# Patient Record
Sex: Male | Born: 1945 | Race: Black or African American | Hispanic: No | Marital: Married | Smoking: Former smoker
Health system: Southern US, Community
[De-identification: ages and names within clinical notes are randomized; demographics above are authoritative.]

## PROBLEM LIST (undated history)

## (undated) DIAGNOSIS — G709 Myoneural disorder, unspecified: Secondary | ICD-10-CM

## (undated) DIAGNOSIS — I219 Acute myocardial infarction, unspecified: Secondary | ICD-10-CM

## (undated) DIAGNOSIS — G4730 Sleep apnea, unspecified: Secondary | ICD-10-CM

## (undated) DIAGNOSIS — I10 Essential (primary) hypertension: Secondary | ICD-10-CM

## (undated) DIAGNOSIS — R0602 Shortness of breath: Secondary | ICD-10-CM

## (undated) DIAGNOSIS — E785 Hyperlipidemia, unspecified: Secondary | ICD-10-CM

## (undated) DIAGNOSIS — J449 Chronic obstructive pulmonary disease, unspecified: Secondary | ICD-10-CM

## (undated) DIAGNOSIS — I2510 Atherosclerotic heart disease of native coronary artery without angina pectoris: Secondary | ICD-10-CM

## (undated) HISTORY — PX: BACK SURGERY: SHX140

## (undated) HISTORY — PX: KNEE ARTHROPLASTY: SHX992

## (undated) HISTORY — DX: Acute myocardial infarction, unspecified: I21.9

## (undated) HISTORY — DX: Essential (primary) hypertension: I10

## (undated) HISTORY — DX: Myoneural disorder, unspecified: G70.9

## (undated) HISTORY — DX: Atherosclerotic heart disease of native coronary artery without angina pectoris: I25.10

## (undated) HISTORY — PX: CORONARY STENT PLACEMENT: SHX1402

## (undated) HISTORY — DX: Hyperlipidemia, unspecified: E78.5

## (undated) HISTORY — DX: Chronic obstructive pulmonary disease, unspecified: J44.9

## (undated) HISTORY — DX: Sleep apnea, unspecified: G47.30

## (undated) HISTORY — PX: CARDIAC CATHETERIZATION: SHX172

---

## 1998-08-11 ENCOUNTER — Ambulatory Visit (HOSPITAL_COMMUNITY): Admission: RE | Admit: 1998-08-11 | Discharge: 1998-08-11 | Payer: Self-pay | Admitting: Family Medicine

## 1999-03-07 ENCOUNTER — Encounter: Payer: Self-pay | Admitting: Family Medicine

## 1999-03-07 ENCOUNTER — Ambulatory Visit (HOSPITAL_COMMUNITY): Admission: RE | Admit: 1999-03-07 | Discharge: 1999-03-07 | Payer: Self-pay | Admitting: Family Medicine

## 2000-07-09 ENCOUNTER — Ambulatory Visit (HOSPITAL_COMMUNITY): Admission: RE | Admit: 2000-07-09 | Discharge: 2000-07-09 | Payer: Self-pay | Admitting: Neurosurgery

## 2000-07-09 ENCOUNTER — Encounter: Payer: Self-pay | Admitting: Neurosurgery

## 2000-07-23 ENCOUNTER — Encounter: Payer: Self-pay | Admitting: Neurosurgery

## 2000-07-23 ENCOUNTER — Ambulatory Visit (HOSPITAL_COMMUNITY): Admission: RE | Admit: 2000-07-23 | Discharge: 2000-07-23 | Payer: Self-pay | Admitting: Neurosurgery

## 2000-08-06 ENCOUNTER — Encounter: Payer: Self-pay | Admitting: Neurosurgery

## 2000-08-06 ENCOUNTER — Ambulatory Visit (HOSPITAL_COMMUNITY): Admission: RE | Admit: 2000-08-06 | Discharge: 2000-08-06 | Payer: Self-pay | Admitting: Neurosurgery

## 2001-02-03 ENCOUNTER — Encounter: Admission: RE | Admit: 2001-02-03 | Discharge: 2001-02-03 | Payer: Self-pay | Admitting: Family Medicine

## 2001-02-03 ENCOUNTER — Encounter: Payer: Self-pay | Admitting: Family Medicine

## 2002-12-29 ENCOUNTER — Encounter: Payer: Self-pay | Admitting: Orthopedic Surgery

## 2003-01-03 ENCOUNTER — Inpatient Hospital Stay (HOSPITAL_COMMUNITY): Admission: RE | Admit: 2003-01-03 | Discharge: 2003-01-06 | Payer: Self-pay | Admitting: Orthopedic Surgery

## 2004-04-11 ENCOUNTER — Ambulatory Visit (HOSPITAL_COMMUNITY): Admission: RE | Admit: 2004-04-11 | Discharge: 2004-04-11 | Payer: Self-pay | Admitting: *Deleted

## 2004-04-12 ENCOUNTER — Ambulatory Visit (HOSPITAL_COMMUNITY): Admission: RE | Admit: 2004-04-12 | Discharge: 2004-04-13 | Payer: Self-pay | Admitting: *Deleted

## 2004-04-26 ENCOUNTER — Ambulatory Visit: Payer: Self-pay | Admitting: Internal Medicine

## 2004-05-28 ENCOUNTER — Ambulatory Visit: Payer: Self-pay | Admitting: Cardiology

## 2004-06-05 ENCOUNTER — Ambulatory Visit: Payer: Self-pay | Admitting: Cardiology

## 2004-06-20 ENCOUNTER — Ambulatory Visit: Payer: Self-pay

## 2004-07-10 ENCOUNTER — Ambulatory Visit: Payer: Self-pay | Admitting: Internal Medicine

## 2004-07-25 ENCOUNTER — Ambulatory Visit: Payer: Self-pay | Admitting: Cardiology

## 2004-08-08 ENCOUNTER — Ambulatory Visit: Payer: Self-pay

## 2004-08-14 ENCOUNTER — Ambulatory Visit: Payer: Self-pay | Admitting: Cardiology

## 2004-09-11 ENCOUNTER — Ambulatory Visit: Payer: Self-pay | Admitting: Cardiology

## 2004-09-17 ENCOUNTER — Ambulatory Visit: Payer: Self-pay | Admitting: Cardiology

## 2004-10-08 ENCOUNTER — Ambulatory Visit: Payer: Self-pay | Admitting: Cardiology

## 2005-02-01 ENCOUNTER — Ambulatory Visit: Payer: Self-pay | Admitting: Cardiology

## 2005-02-04 ENCOUNTER — Ambulatory Visit: Payer: Self-pay | Admitting: Cardiology

## 2005-02-06 ENCOUNTER — Ambulatory Visit: Payer: Self-pay | Admitting: Internal Medicine

## 2005-02-06 ENCOUNTER — Inpatient Hospital Stay (HOSPITAL_BASED_OUTPATIENT_CLINIC_OR_DEPARTMENT_OTHER): Admission: RE | Admit: 2005-02-06 | Discharge: 2005-02-06 | Payer: Self-pay | Admitting: Cardiology

## 2005-02-28 ENCOUNTER — Ambulatory Visit: Payer: Self-pay | Admitting: Internal Medicine

## 2005-06-12 ENCOUNTER — Encounter: Admission: RE | Admit: 2005-06-12 | Discharge: 2005-06-12 | Payer: Self-pay | Admitting: Neurosurgery

## 2005-07-17 ENCOUNTER — Encounter: Admission: RE | Admit: 2005-07-17 | Discharge: 2005-07-17 | Payer: Self-pay | Admitting: Neurosurgery

## 2005-07-30 ENCOUNTER — Encounter: Admission: RE | Admit: 2005-07-30 | Discharge: 2005-07-30 | Payer: Self-pay | Admitting: Neurosurgery

## 2005-10-17 ENCOUNTER — Ambulatory Visit: Payer: Self-pay | Admitting: Cardiology

## 2006-08-14 ENCOUNTER — Ambulatory Visit: Payer: Self-pay | Admitting: Cardiology

## 2007-07-31 ENCOUNTER — Ambulatory Visit: Payer: Self-pay | Admitting: Cardiology

## 2008-08-09 ENCOUNTER — Ambulatory Visit: Payer: Self-pay | Admitting: Cardiology

## 2009-06-22 ENCOUNTER — Encounter (INDEPENDENT_AMBULATORY_CARE_PROVIDER_SITE_OTHER): Payer: Self-pay | Admitting: *Deleted

## 2009-08-07 DIAGNOSIS — I2510 Atherosclerotic heart disease of native coronary artery without angina pectoris: Secondary | ICD-10-CM | POA: Insufficient documentation

## 2009-08-07 DIAGNOSIS — I10 Essential (primary) hypertension: Secondary | ICD-10-CM | POA: Insufficient documentation

## 2009-08-07 DIAGNOSIS — E663 Overweight: Secondary | ICD-10-CM | POA: Insufficient documentation

## 2009-08-07 DIAGNOSIS — R002 Palpitations: Secondary | ICD-10-CM | POA: Insufficient documentation

## 2009-08-07 DIAGNOSIS — E7800 Pure hypercholesterolemia, unspecified: Secondary | ICD-10-CM | POA: Insufficient documentation

## 2009-08-08 ENCOUNTER — Encounter: Payer: Self-pay | Admitting: Cardiology

## 2009-08-08 DIAGNOSIS — F17200 Nicotine dependence, unspecified, uncomplicated: Secondary | ICD-10-CM | POA: Insufficient documentation

## 2009-08-09 ENCOUNTER — Ambulatory Visit: Payer: Self-pay | Admitting: Cardiology

## 2010-07-24 NOTE — Letter (Signed)
Summary: Appointment - Reminder [place], [place]  [address] [place]   [place], [place] [address]   Phone: [phone]  Fax: [phone]     June 22, 2009 MRN: [id number]   Gregory [name] [place], [place]  [id number]   Dear [name],  [place] records indicate that it is time to schedule a follow-up appointment with Dr. Ron Parker. It is very important that we reach you to schedule this appointment. We look forward to participating in your health care needs.   Please contact us at the number listed above at your earliest convenience to schedule your appointment.  If you are unable to make an appointment at this time, give [place] a call so we can update our records.  Sincerely,   Darnell Level [facility] Scheduling Team

## 2010-07-24 NOTE — Miscellaneous (Signed)
  Clinical Lists Changes  Problems: Added new problem of TOBACCO ABUSE (ICD-305.1) Observations: Added new observation of PAST MED HX: PALPITATIONS (ICD-785.1) CAD  DES...2005  /   relook cath 2006...no change...residual 70% Dx..old total RCA OVERWEIGHT/OBESITY (ICD-278.02) HYPERCHOLESTEROLEMIA  IIA (ICD-272.0) HYPERTENSION, UNSPECIFIED (ICD-401.9) erectile dysfunction.  Degenerative joint disease.  Gout.  Benign prostatic hypertrophy.  Knee arthritis. Tobacco abuse  (08/08/2009 17:49)       Past History:  Past Medical History: PALPITATIONS (ICD-785.1) CAD  DES...2005  /   relook cath 2006...no change...residual 70% Dx..old total RCA OVERWEIGHT/OBESITY (ICD-278.02) HYPERCHOLESTEROLEMIA  IIA (ICD-272.0) HYPERTENSION, UNSPECIFIED (ICD-401.9) erectile dysfunction.  Degenerative joint disease.  Gout.  Benign prostatic hypertrophy.  Knee arthritis. Tobacco abuse

## 2010-07-24 NOTE — Assessment & Plan Note (Signed)
Summary: f1y  Medications Added CARVEDILOL 12.5 MG TABS (CARVEDILOL) Take one tablet by mouth once daily AVODART 0.5 MG CAPS (DUTASTERIDE) once daily ENALAPRIL MALEATE 5 MG TABS (ENALAPRIL MALEATE) Take one tablet by mouth twice a day CADUET 10-10 MG TABS (AMLODIPINE-ATORVASTATIN) once daily      Allergies Added: NKDA  Visit Type:  Follow-up  CC:  CAD.  History of Present Illness: The patient is seen for followup of coronary artery disease.  I saw him last exactly one year ago.  He is doing well.  He is not having any chest pain.  Unfortunately he continues to smoke.  He and I had a careful discussion about this and I urged him to begin to quit.  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  He received circumflex stents in 2005.  He had a relook catheter in 2006 and the stents were open.  There was a residual 70% diagonal lesion.  He needs ongoing aggressive secondary prevention.  He is overweight.  Current Medications (verified): 1)  Carvedilol 12.5 Mg Tabs (Carvedilol) .... Take One Tablet By Mouth Once Daily 2)  Avodart 0.5 Mg Caps (Dutasteride) .... Once Daily 3)  Enalapril Maleate 5 Mg Tabs (Enalapril Maleate) .... Take One Tablet By Mouth Twice A Day 4)  Caduet 10-10 Mg Tabs (Amlodipine-Atorvastatin) .... Once Daily  Allergies (verified): No Known Drug Allergies  Past History:  Past Medical History: Last updated: 08/08/2009 PALPITATIONS (ICD-785.1) CAD  DES...2005  /   relook cath 2006...no change...residual 70% Dx..old total RCA OVERWEIGHT/OBESITY (ICD-278.02) HYPERCHOLESTEROLEMIA  IIA (ICD-272.0) HYPERTENSION, UNSPECIFIED (ICD-401.9) erectile dysfunction.  Degenerative joint disease.  Gout.  Benign prostatic hypertrophy.  Knee arthritis. Tobacco abuse  Review of Systems       Patient denies fever, chills, headache, sweats, rash, change in vision, change in hearing, chest pain, cough, shortness of breath, nausea or vomiting, urinary symptoms.  All  of the systems are reviewed and are negative.  Vital Signs:  Patient profile:   65 year old male Height:      75 inches Weight:      277 pounds BMI:     34.75 Pulse rate:   78 / minute BP sitting:   164 / 86  (left arm) Cuff size:   large  Vitals Entered By: Mignon Pine, RMA (August 09, 2009 3:26 PM)  Physical Exam  General:  patient is definitely overweight.  He is stable today. Head:  head is atraumatic. Eyes:  eyes reveal no xanthelasma. Neck:  no jugular venous distention.  No carotid bruits. Chest Wall:  no chest wall tenderness. Lungs:  lungs are clear.  Respiratory effort is nonlabored. Heart:  cardiac exam reveals S1-S2.  No clicks or significant murmurs. Abdomen:  abdomen is obese. Msk:  no musculoskeletal deformities. Extremities:  no peripheral edema. Skin:  no skin rashes. Psych:  patient is oriented to person time and place.  Affect is normal.   Impression & Recommendations:  Problem # 1:  TOBACCO ABUSE (ICD-305.1) I've had a careful discussion with the patient about his smoking.  I have strongly urged him to begin to cut back again and try to stop.  Problem # 2:  PALPITATIONS (ICD-785.1) The patient has not been having any palpitations.  No further workup is needed.  Problem # 3:  CAD, NATIVE VESSEL (ICD-414.01)  His updated medication list for this problem includes:    Carvedilol 12.5 Mg Tabs (Carvedilol) .Marland Kitchen... Take one tablet by mouth once daily  Enalapril Maleate 5 Mg Tabs (Enalapril maleate) .Marland Kitchen... Take one tablet by mouth twice a day  Orders: EKG w/ Interpretation (93000) The patient has not been having chest pain.  EKG is done and reviewed by me.  There no significant changes.  The patient needs aggressive secondary prevention.  Problem # 4:  HYPERCHOLESTEROLEMIA  IIA (ICD-272.0)  His updated medication list for this problem includes:    Caduet 10-10 Mg Tabs (Amlodipine-atorvastatin) ..... Once daily The patient is on medication for his  cholesterol.  He will be following up with his primary physician soon for his annual physical.  It would be optimal to have his LDL at 70.  Problem # 5:  HYPERTENSION, UNSPECIFIED (ICD-401.9)  His updated medication list for this problem includes:    Carvedilol 12.5 Mg Tabs (Carvedilol) .Marland Kitchen... Take one tablet by mouth once daily    Enalapril Maleate 5 Mg Tabs (Enalapril maleate) .Marland Kitchen... Take one tablet by mouth twice a day    Caduet 10-10 Mg Tabs (Amlodipine-atorvastatin) ..... Once daily Blood pressure is elevated today.  He will have followup blood pressure with his primary physician to decide if further medicine changes are needed.  Weight loss would certainly help.  Patient Instructions: 1)  Follow up in 1 year

## 2010-11-06 NOTE — Assessment & Plan Note (Signed)
[facility] OFFICE NOTE   ORVELL, DERENZO                        MRN:          [id number]  DATE:07/31/2007                            DOB:          [birth year]    HISTORY:  Mr. Gregory [name] is doing well.  He has gained weight and we spoke  about this.  Otherwise, he is doing well.  He remains active.  He is not  having any significant chest pain.  He is retired.  He had been  considering knee surgery, but decided to hold off and he is managing  without it at this point.   ALLERGIES:  NO KNOWN DRUG ALLERGIES.   MEDICATIONS:  1. Caduet.  2. Avodart.  3. Multivitamin.  4. Aspirin.  5. Coreg.  6. Sertraline.  7. Enalapril.   OTHER MEDICAL PROBLEMS:  See the list below.   REVIEW OF SYSTEMS:  Other than the HPI, his review of systems is  negative.   PHYSICAL EXAMINATION:  VITAL SIGNS:  His weight is up to 258 pounds.  We  talked a bit about this and I made it clear that he needs to lose  weight, start an exercise program and cut his calories.  Blood pressure  is 140/87, pulse 69.  GENERAL:  The patient is oriented to person, time and place.  Affect is  normal.  HEENT:  Reveals no xanthelasma.  He has normal extraocular  motion.  NECK:  There are no carotid bruits.  There is no jugular venous  distention.  LUNGS:  Clear.  Respiratory effort is not labored.  CARDIAC:  Reveals S1-S2.  There are no clicks or significant murmurs.  ABDOMEN:  Obese.  EXTREMITIES:  He has no peripheral edema.   DIAGNOSTICS:  EKG reveals sinus rhythm.   PROBLEMS:  1. History of coronary disease with a drug-eluting stent in 2005 with      a ZoMaxx trial.  Re-look cath in August 2006 looked good.  Diagonal      was 70% stenosed.  The circumflex stents were patent.  Right      coronary artery historically was occluded in the past.  Situation      was stable.  There is no reason for further testing at this time.  2. Hypertension.  He  is borderline adequately treated and he is      following with Dr. Juventino Slovak.  3.  Hyperlipidemia, on medication.  3. Obesity.  He clearly needs to lose weight.  4. History of palpitations.  5. History of tobacco use in the past.  6. History of erectile dysfunction.  7. Degenerative joint disease.  8. Gout.  9. Benign prostatic hypertrophy.  10.Knee arthritis, but this is stable.   PLAN:  He is stable.  I am not changing his meds.  Weight loss will be  very important.     Carlena Bjornstad, MD, [facility]  Electronically Signed    JDK/MedQ  DD: 07/31/2007  DT: 08/01/2007  Job #: [id number]   cc:   Nelda Severe. Juventino Slovak, M.D.

## 2010-11-06 NOTE — Assessment & Plan Note (Signed)
[facility] HEALTHCARE                            CARDIOLOGY OFFICE NOTE   [name], [name]                        MRN:          [id number]  DATE:08/09/2008                            DOB:          05-01-1946    Gregory [name] is doing well.  He has coronary disease.  He has not had any  chest discomfort.  Unfortunately, he has gained weight.  He is doing  what he can to lose weight.  He does continue to smoke.  He and I had an  extensive discussion about this.  He described to me how he has cut out  his cigarettes at nighttime, but he still needs to work on cutting out  more during the day.  He knows that fewer is better and no cigarettes is  best.  Also, I asked him to encourage his wife to stop also.  He has  been doing well.  He has not had any chest pain or shortness of breath.  There has been no syncope or presyncope.   ALLERGIES:  No known drug allergies.   MEDICATIONS:  Caduet, Avodart, multivitamin, aspirin, Coreg, sertraline,  and enalapril.   OTHER MEDICAL PROBLEMS:  See the list below.   REVIEW OF SYSTEMS:  He has no GI or GU symptoms.  He has no fevers or  chills.  There are no headaches.  He has no eye problems or dental  problems.  Otherwise, his review of systems is negative.   PHYSICAL EXAMINATION:  Blood pressure is 146/88 with a pulse of 64.  Weight is 275 pounds which is definitely up from last year.  HEENT  reveals no xanthelasma.  He has normal extraocular motion.  There are no  carotid bruits.  There is no jugular venous distention.  Lungs are  clear.  Respiratory effort is not labored.  Cardiac exam reveals an S1  with an S2.  There are no clicks or significant murmurs.  The abdomen is  obese.  There is no peripheral edema.   EKG shows no significant change.   Problems include:  1. History of coronary disease with drug-eluting stent in 2005.      Relook catheterization in 2006 looked good.  There is a diagonal      with a 70%  stenosis.  Circumflex stents were patent.  Right      coronary artery was historically occluded.  He does not need any      testing.  He needs aggressive secondary prevention.  He needs to      stop smoking and to have his LDL as low as 7.  2. Hypertension, treated.  3. Obesity.  He clearly needs to lose weight.  4. History of palpitations, stable.  5. Tobacco use.  See my full discussion above.  6. History of erectile dysfunction.  7. Degenerative joint disease.  8. Gout.  9. Benign prostatic hypertrophy.  10.Knee arthritis.   He is stable.  He does not need any further studies.     Gregory Bjornstad, MD, [facility]  Electronically Signed    JDK/MedQ  DD: 08/09/2008  DT: 08/09/2008  Job #: [id number]   cc:   Nelda Severe. Juventino Slovak, M.D.

## 2010-11-09 NOTE — Assessment & Plan Note (Signed)
[facility] HEALTHC[facility]                            CARDIOLOGY OFFICE NOTE   [name], Gregory                        MRN:          [id number]  DATE:08/14/2006                            DOB:          August 03, 1945    Gregory [name] is doing well.  He has known coronary disease, unfortunately,  he continues to smoke.  He said that it is in the range of 10-11  cigarettes a day.  He and I once again discussed this and urged him to  stop.  Patient received drug-eluting stents in 2005.  He did not have  chest pain at that time.  He had had some palpitations and shortness of  breath on exertion.  The Cardiolite raised the question of inferior  ischemia, and we did proceed with a catheterization.  It is of note that  an echo done similar to that time in September of 2005 revealed  basically normal LV function.  Patient then underwent a cardiac  catheterization.  Study was done on April 12, 2004.  At that time, it  appeared that he had LV dysfunction.  There was decreased motion of the  inferior wall.  He had 3-vessel coronary disease with chronic total  occlusion of his right coronary artery with left-to-right collaterals.  There was 60% second diagonal stenosis, there was also discrete 80% mid  circumflex lesion.  The circumflex was treated with 2 drug-eluting  stents.  The procedure was somewhat complicated by a linear dissection  that was treated with the stents.  He stabilized, and was ultimately  discharged.  He was part of the Zommax trial, and he did well.  I have  followed him over time.  He is doing well and not having any chest pain.  There have been difficulties with blood pressure control.  Ultimately,  this stabilized with the patient on the appropriate medications.  Abdominal ultrasound and renal ultrasounds have been technically  difficult.  He has no evidence of renal artery stenosis.  However, with  the study technically difficult, CT or MR could be  considered, although  this is a problem that is under better control at this point.  Patient  was seen last in April of 2007, appeared to be stable.   Patient now needs a total knee replacement on the left knee.  He is here  for evaluation to see if he can be cleared surgically.   PAST MEDICAL HISTORY:   ALLERGIES:  NO KNOWN DRUG ALLERGIES.   MEDICATIONS:  1. Caduet 10/10.  2. Avodart 0.5.  3. Multivitamin.  4. Aspirin 81.  5. Coreg 12.5 b.i.d.  6. Enalapril 5 b.i.d.  7. Plavix 75.   OTHER MEDICAL PROBLEMS.:  See the list below.   REVIEW OF SYSTEMS:  He has no complaints and feels well.  His review of  systems is negative.   PHYSICAL EXAMINATION:  Weight is 244 pounds.  Blood pressure today is  176/95, and once again, it is higher than we would like, and his  enalapril dose will be increased.  Pulse is 66.  Patient is oriented  to person, time, and place.  Affect is normal.  He  is overweight.  There is no xanthelasma.  He has normal extraocular motion.  He has no  carotid bruits.  There is no jugular venous distension.  CARDIAC EXAM:  Reveals an S1 with an S2.  There are no clicks or  significant murmurs.  His abdomen is soft.  He has no masses or bruits.  There is no peripheral edema.  There are no major musculoskeletal deformities.   EKG reveals normal sinus rhythm.  There are no diagnostic abnormalities.   PROBLEM LIST:  1. History of coronary artery disease post stenting with drug-eluting      stents in 2005, and the Zommax trial.  He is doing well.  He      underwent relook catheterization in August of 2006.  His left      anterior descending showed only minor disease.  Diagonal was 70%      stenosed.  The circumflex stents were patent.  Right coronary      artery, historically, was occluded in the past.  This revealed that      his situation was stable at that time.  2. Hypertension.  Will adjust his meds further.  3. Hyperlipidemia.  This needs to be followed  up.  4. Obesity.  He needs to lose weight.  5. History of palpitations, stable.  6. History of tobacco use, once again, we discussed this fully.  7. History of erectile dysfunction.  8. History of DJD.  9. History of gout.  10.BPH.  11.Need for a left knee replacement.   Because the patient did have a relook catheterization, and we know that  his stents were stable, I feel that we can proceed with his surgery  without further testing.  I have thought very carefully about his  Plavix.  He is now over 2 years out from his stent.  I have decided to  stop his Plavix, but stop it now to be sure that he remains stable prior  to undergoing his knee replacement.  Plavix will be stopped today.  He  will be given permission to proceed with his knee surgery.  The surgery  needs to be at least 2 weeks from now to be sure that the patient has  had no symptoms during that period from withdrawal from his Plavix.     Carlena Bjornstad, MD, [facility]  Electronically Signed    JDK/MedQ  DD: 08/14/2006  DT: 08/14/2006  Job #: [id number]   cc:   Gaynelle Arabian, M.D.  Nelda Severe Juventino Slovak, M.D.

## 2010-11-09 NOTE — Cardiovascular Report (Signed)
NAMEJHOEL, Gregory               ACCOUNT NO.:  [id number]   MEDICAL RECORD NO.:  [id number]          PATIENT TYPE:  OIB   LOCATION:  6501                         FACILITY:  [facility]   PHYSICIAN:  Loretha Brasil. Lia Foyer, M.D. [facility] OF BIRTH:  12/30/1945   DATE OF PROCEDURE:  02/06/2005  DATE OF DISCHARGE:                              CARDIAC CATHETERIZATION   INDICATIONS:  [name] is a delightful 65 year old gentleman who previously  was enrolled in the ZOMAXX trial. This study was done as a research follow-  up to assess vessel patency. Intravascular ultrasound was done after the  initial stent deployment, but they were unable to get the ultrasound  catheter down after the dilatation due to the anatomic considerations in the  circumflex coronary artery. Therefore, repeat ultrasound was not attempted.   PROCEDURE:  Left coronary artery angiography.   DESCRIPTION OF PROCEDURE:  Following informed consent, the patient was  brought to the catheterization laboratory, prepped and draped in usual  fashion.  Through an anterior puncture, a 6-French sheath was placed.  A  left 4 diagnostic 6-French catheter was then utilized to take pictures in  the preprescribed views utilized during the interventional procedure. The  procedure demonstrated continued patency of the circumflex stent. Multiple  views of left coronary were obtained. The right coronary artery is known to  be occluded with extensive left-to-right collaterals and the collaterals  were visualized on this study so therefore the right coronary was not  reinjected.  Ventriculography was not performed as previous evaluation of  left ventricular function has been obtained.  The procedure was performed  without complication. He was taken to the holding area in satisfactory  clinical condition for direct manual hemostasis.   HEMODYNAMIC DATA:  Central aortic pressure 139/71, mean 98.   ANGIOGRAPHIC DATA:  1.  The left main coronary  artery is free of critical disease.  2.  The target artery is the circumflex coronary artery. There are      overlapping stents in the circumflex coronary artery.  The stents appear      widely patent without significant focal narrowing. There is minimal      luminal irregularity of about 30% beyond the stent location, but it does      not appear to be high-grade in the multiple views obtained. This vessel      terminates as a large bifurcating marginal in this vessel.  3.  The left anterior descending artery courses to the apex where it      provides extensive collateralization of the PDA all the way back to the      mid vessel.  There are also collaterals that are provided to the      posterolateral system.  The first diagonal is without critical disease.      The second diagonal has what appears to be about 70% ostial stenosis      followed by a 70% segmental mid stenosis. It is a small to moderate size      vessel.  This vessel does provide collaterals to the posterolateral  system.   CONCLUSION:  Continued patency of the previously placed overlapping stents  in the circumflex coronary artery.   DISPOSITION:  The patient will have follow up in the clinic.      Loretha Brasil. Lia Foyer, M.D. [facility]  Electronically Signed     TDS/MEDQ  D:  02/06/2005  T:  02/06/2005  Job:  [id number]   cc:   CV Lab

## 2010-11-09 NOTE — Discharge Summary (Signed)
NAME:  Gregory [name], Gregory [name]                         ACCOUNT NO.:  [id number]   MEDICAL RECORD NO.:  [id number]                   PATIENT TYPE:  INP   LOCATION:  [id number]                                 FACILITY:  [facility]   PHYSICIAN:  Gaynelle Arabian, M.D.                 DATE OF BIRTH:  May 29, 1946   DATE OF ADMISSION:  01/03/2003  DATE OF DISCHARGE:  01/06/2003                                 DISCHARGE SUMMARY   ADMITTING DIAGNOSES:  1. Osteoarthritis, right knee.  2. Hypertension.  3. Hypercholesterolemia.  4. Benign prostatic hypertrophy.  5. Lumbar degenerative disk disease.   DISCHARGE DIAGNOSES:  1. Osteoarthritis right knee, status post right total knee arthroplasty.  2. Mild postoperative blood loss anemia, did not require transfusion.  3. Postoperative hypokalemia.  4. Hypertension.  5. Hypercholesterolemia.  6. Benign prostatic hypertrophy.  7. Lumbar degenerative disk disease.   PROCEDURE:  The patient was taken to the operating room on January 03, 2003,  underwent a right total knee arthroplasty.  Surgeon:  Dr. Pilar Plate Aluisio.  Assistant:  Arlee Muslim, P.A.C.  Spinal anesthesia.  Estimated blood loss  minimal.  Hemovac drain x1.  Tourniquet time:  50 minutes at 300 mmHg.   CONSULTS:  None.   BRIEF HISTORY:  The patient is a 65 year old male seen by Dr. Wynelle Link for  right knee pain.  Has known bilateral knee arthritis.  Has undergone Synvisc  injections with only minimal relief.  The pain has been increasing to the  point where he is having pain throughout the day.  He was seen in the office  where x-rays showed severe end-stage arthritis, especially in the right  knee.  It was felt he would benefit from undergoing surgery.  The risks and  benefits were discussed.  The patient elected to proceed with surgery.   LABORATORY DATA:  CBC on admission revealed a hemoglobin of 14.4, hematocrit  of 42.4, white cell count 7.4, red cell count 4.79, differential all within  normal  limits.  Postoperative H&H 11.5 and 33.7.  Last noted H&H 10.1 and  29.4.  PT and PTT on admission 13.3 and 29 respectively, INR of 1.  Last  noted PT/INR 15.9 and 1.4.  Chemistry panel on admission:  Low potassium of  3.3, remaining chemistry panel all within normal limits.  Serial BMETs were  followed.  Potassium remained low at 3.3, down to 3.1.  Glucose went up from  94 to 124, back down to 119.  Calcium dropped from 8.6 down to 8.2.  Urinalysis did show trace ketones, small leukocyte esterase, only a few  epithelial cells, 3-6 white cells, and hyaline casts.  Blood group type A  positive.   EKG dated December 29, 2002:  Normal sinus rhythm with sinus arrhythmia.  Normal  EKG.  No old tracing to compare, per Dr. Dorris Carnes.   Chest x-ray dated December 29, 2002:  No  active lung findings.  There is  scoliosis of the thoracic spine with mild degenerative changes.  There was  thought to be a small granuloma in the right mid lung field.  Follow-up  chest x-ray suggested in two to three months.   HOSPITAL COURSE:  The patient was admitted to [facility], taken to  the OR, underwent the above-stated procedure without complication.  The  patient tolerated the procedure well and was later transferred to the  recovery room and then to the orthopedic floor for continued postoperative  care.  Vital signs were followed.  The patient received 24 hours of  postoperative antibiotics in the form of Ancef, given Coumadin for DVT  prophylaxis, started on PCA and p.o. analgesia for pain control following  surgery, started weightbearing as tolerated.  PT and OT were consulted to  assist with gait-training ambulation and ADLs following surgery.  Hemovac  drain placed at the time of surgery was pulled on postoperative day #1  without difficulty.  The patient was given potassium supplements due to the  low potassium.  The patient had a fairly decent ______.  On postoperative  day #1 fluids were reduced to  a KVO rate.  The patient started out with  physical therapy and actually progressed very well with PT.  Already up  ambulating approximately 130 feet by postoperative day #2 and then 200 feet  later that evening.  On day #2 the dressing was changed.  The incision was  healing well, with no signs of infection.  Also, the PC analgesia was  discontinued.  Discharge planning consulted to assist with post hospital  care.  The patient was doing quite well.  Had already been up ambulating 200  feet.  Felt to be ready for home the following day.  The following day, on  January 06, 2003, doing quite well.  Seen by head nurse for Dr. Wynelle Link and  discharged home.   DISCHARGE PLAN:  1. Discharged home on January 06, 2003.  2. Discharge diagnoses:  Please see above.  3. Discharge medications:  Coumadin for DVT prophylaxis.  Percocet for pain     or spasm.  4. Diet:  As tolerated.  5. Follow-up:  Two weeks from surgery.  6. Activity:  Full weightbearing, total-knee protocol.  Home health PT, home     health nursing through [facility].   CONDITION UPON DISCHARGE:  Improved.     Alexzandrew L. Dara Lords, P.A.              Gaynelle Arabian, M.D.    ALP/MEDQ  D:  02/02/2003  T:  02/03/2003  Job:  [id number]   cc:   Nelda Severe. Juventino Slovak, M.D.  [address] [place]  [place] [address]  Fax: [phone]   Hanley Ben, M.D.  [place]. [address] [place]  [place] [address]  Fax: [phone]

## 2010-11-09 NOTE — Cardiovascular Report (Signed)
NAMESMAUEL, HORACEK               ACCOUNT NO.:  [id number]   MEDICAL RECORD NO.:  [id number]          PATIENT TYPE:  OIB   LOCATION:  2899                         FACILITY:  [facility]   PHYSICIAN:  Junious Silk, M.D. LHCDATE OF BIRTH:  11-Dec-1945   DATE OF PROCEDURE:  04/12/2004  DATE OF DISCHARGE:                              CARDIAC CATHETERIZATION   PROCEDURE:  1.  Left heart catheterization with coronary angiography and left      ventriculography.  2.  Percutaneous transluminal coronary angioplasty placement with drug      eluding stent x2 in the mid left circumflex coronary artery.  3.  Intravascular ultrasound was utilized.   INDICATIONS FOR PROCEDURE:  Mr. Kirshenbaum is a 65 year old male who presented  with palpitations and was found to have premature ventricular contractions  and premature atrial contractions. He had evaluation including a stress  nuclear scan, which showed an ejection fraction of approximately 45% with  inferior infarct and inferolateral ischemia. He was thus referred for  cardiac catheterization.   CATHETERIZATION PROCEDURE:  A #6 French sheath was placed in the right  femoral artery. Coronary angiography was performed with a standard Judkins  #6 French catheters. Left ventriculography was performed with an angled  pigtail catheter. Contrast was Omnipaque. There were no complications.   HEMODYNAMICS:  Left ventricular pressure 164/26.  Aortic pressure 172/100.  There is no aortic valve gradient.   LEFT VENTRICULOGRAM:  There is moderate akinesis of the inferior wall.  Ejection fraction estimated at 40%. There is no mitral regurgitation.   CORONARY ARTERIOGRAPHY(RIGHT DOMINANT):  Left main is normal.  Left anterior descending artery has a 20% stenosis in the mid vessel. The  left anterior descending gives rise to 2 normal size diagonal branches. The  second diagonal branch has a 60% stenosis at its ostium and a tubular 75%  stenosis in the mid  segment.  Left circumflex is a large vessel, giving rise to small first and second  marginal branches and a large bifurcating third obtuse marginal branch. This  was a very tortuous vessel. In the mid circumflex, there is a discrete 80%  stenosis.  Right coronary artery has a long 90% stenosis in the proximal vessel,  followed by 100% occlusion at the proximal portion of the mid right coronary  artery. The distal right coronary artery including a posterior descending  and posterolateral branch filled the left to right collaterals.   IMPRESSION:  1.  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  2.  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      There is a significant disease in the mid left circumflex which does      correspond with the area of ischemia, demonstrated on Cardiolite. There      is also disease in the second diagonal branch, which is approximately a      2 mm diameter vessel.   PLAN:  After reviewing the images and discussion  with the patient, we have  opted to proceed with percutaneous coronary intervention of the left  circumflex. percutaneous transluminal coronary angioplasty   PERCUTANEOUS TRANSLUMINAL CORONARY ANGIOPLASTY PROCEDURE NOTE:  Following  completion of the diagnostic catheterization, we proceeded with percutaneous  coronary intervention. The patient was enrolled in the Zomax 2 study and  treated per randomization. Angiomax was administered per protocol. We used a  #6 [place] CLS 3.5 guiding catheter. An Asahi guidewire was advanced under  fluoroscopic guidance into the distal circumflex. We then performed  percutaneous transluminal coronary angioplasty with a 3.5 x 12 mm Quantum  balloon inflated to 12 atmospheres. This did result in improvement in the  vessel lumen. We then advanced a 3.5 x 13 mm Zomax  study stent into position  across the lesion. As we were positioning the stent across the lesion, a  significant dissection developed within the vessel to the tortuous segment  in which we were placing the stent. We therefore, advanced the stent to  cover the distal edge of this dissection and applied the stent at 8  atmospheres. We then advanced a second Zomax study stent, which was 3.5 x 18  mm and positioned this in the proximal area of disease with minimal overlap  of the first placed stent. This stent was deployed at 9 atmospheres. We then  went in with a 3.5 x 12 mm Quantum balloon and inflated this to 12  atmospheres in the distal aspect of the stent, 14 atmospheres x2 in the mid  portion of the stents and 16 atmospheres in the proximal angio stent.  Following this, we performed intravascular ultrasound of the circumflex.  This revealed that the stent was under-deployed in both the distal and most  proximal segments of the stented vessel. We therefore went in with a 3.75 x  15 mm Quantum balloon and inflated this to 14 atmospheres in the distal edge  of the stent, 18 atmospheres in the mid portion of the stents and 18  atmospheres in the proximal portion of the stent. We then reattempted to  advance her IVIS catheter, however, due to apparent change in the  architecture of the vessel with our last post-dilatation, we were unable to  advance the IVIS catheter around the bends and into the proximal portion of  the stent. We therefore, went back with our 3.75 x 15 mm Quantum balloon and  inflated this to 20 atmospheres in the distal mid and proximal portions of  the stents. Intermittent doses of nitroglycerin were administered. Final  angiographic images were obtained revealing patency of the left circumflex  with 0% residual stenosis at the stent site and TIMI-3 flow into the distal vessel. At the conclusion of the procedure, an Angio-Seal vascular closure  device was placed in the  right femoral artery with good hemostasis.   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 This was successfully  treated with 2 stents.   RESULTS:  Successful percutaneous transluminal coronary angioplasty with  drug eluding stent x2 in the mid left circumflex. An 80% stenosis was  reduced to 0% residual TIMI-3 flow.   PLAN:  Integrilin will be discontinued at this point. The patient will be  treated with Plavix and aspirin for recommended 1 year. He also needs  aggressive risk factor modification. Of note, he should stay on full  strength 325 mg of aspirin and Plavix 75 mg daily x1 year.  MWP/MEDQ  D:  04/12/2004  T:  04/12/2004  Job:  [id number]

## 2010-11-09 NOTE — Op Note (Signed)
NAME:  Gregory [name], Gregory [name]                         ACCOUNT NO.:  [id number]   MEDICAL RECORD NO.:  [id number]                   PATIENT TYPE:  INP   LOCATION:  X007                                 FACILITY:  [facility]   PHYSICIAN:  Gaynelle Arabian, M.D.                 DATE OF BIRTH:  1946/02/14   DATE OF PROCEDURE:  01/03/2003  DATE OF DISCHARGE:                                 OPERATIVE REPORT   PREOPERATIVE DIAGNOSIS:  Osteoarthritis, right knee.   POSTOPERATIVE DIAGNOSIS:  Osteoarthritis, right knee.   PROCEDURE:  Right total knee arthroplasty.   SURGEON:  Dione Plover. Aluisio, M.D.   ASSISTANT:  Alexzandrew L. Dara Lords, P.A.   ANESTHESIA:  Spinal.   ESTIMATED BLOOD LOSS:  Minimal.   DRAIN:  Hemovac x 1.   TOURNIQUET TIME:  50 minutes at 300 mmHg.   COMPLICATIONS:  None.   CONDITION:  Stable to recovery.   BRIEF CLINICAL NOTE:  Mr. Mackillop is a 65 year old male with severe end-stage  osteoarthritis of the right knee with pain refractory to nonoperative  management.  He presents now for right total knee arthroplasty.   PROCEDURE IN DETAIL:  After the successful administration of spinal  anesthetic, a tourniquet is placed high on the right thigh and the right  lower extremity prepped and draped in the usual sterile fashion.  Extremity  is wrapped in Esmarch, knee flexed, and tourniquet inflated to 300 mmHg.  Midline incision is made with a 10 blade through the subcutaneous tissue to  the level of the extensor mechanism.  A fresh blade is used to make a medial  parapatellar arthrotomy.  Then the soft tissue over the proximal and medial  tibia is subperiosteally elevated to joint line with a knife and at the  semimembranosus bursa with a Cobb elevator.  The soft tissue over the  proximal and lateral tibia is also elevated with attention being paid to  avoiding the patella tendon on tibial tubercle.  The patella is then  everted, knee flexed 90 degrees, ACL and PCL removed.  Drill is  used to  create a starting hole in the distal femur, and canal is irrigated.  Five-  degree right valgus alignment guide is placed and referencing off the  posterior condyles, rotation is marked and a block pinned to remove 10 mm  off the distal femur.  Distal femoral resection is made with an oscillating  saw.  Sizing block is placed; and a size 5 is the most appropriate.  Rotation corresponds with the epicondylar axis.  A size 5 cutting block is  placed and the anterior and posterior cuts made.   Tibia is subluxed forward, and the menisci are removed.  Extramedullary  tibial alignment guide is placed, referencing proximally at the medial  aspect of the tibial tubercle and distally along the second metatarsal axis  and tibial crest.  The block is pinned to remove  10 mm off the nondeficient  lateral side, and tibia resection is made with an oscillating saw.  Size 5  tibia is most appropriate, and then the proximal tibia is prepared with the  modular drill and keel punch.  The femur is then completed with the  intercondylar and chamfer cuts.  Size 5 posterior stabilized femoral trial  is placed with a size 5 mobile bearing tibial tray trial and a 10 mm  posterior stabilized rotating platform insert trial.  Full extension is  achieved with excellent varus and valgus balance throughout.   The patella is everted, thickness measured to be 23 mm, free hand resection  taken to 12 mm, a 41 template placed, lug holes drilled, trial patella  placed, and it tracks normally.  Osteophytes are then removed off the  posterior femur with the trial in place.  Trials are all removed and cut  bone surfaces prepared with pulsatile lavage.  Cement is mixed and once  ready for implantation, the size 5 mobile bearing tibial tray, size 5  posterior stabilized femur, and 41 patella are cemented into place and  patella is held with a clamp.  The trial 10 mm insert is placed, knee held  in full extension, and all  extruded cement removed.  Once the cement is  fully hardened, then the permanent 10 mm posterior stabilized rotating  platform insert is placed into the tibial tray.  The knee is reduced with  excellent varus and valgus stability throughout full range of motion.  The  wound is copiously irrigated with antibiotic solution, and then the extensor  mechanism closed over a Hemovac drain with interrupted #1 PDS.  Flexion  against gravity is 140 degrees.  Tourniquet is released for a total time of  50 minutes.  Subcu is closed with interrupted 2-0 Vicryl and subcuticular  with running 4-0 Monocryl.  Incision is cleaned and dried and Steri-Strips  and a bulky sterile dressing applied.  He is then awakened and transported  to recovery in stable condition.                                               Gaynelle Arabian, M.D.    FA/MEDQ  D:  01/03/2003  T:  01/03/2003  Job:  [id number]

## 2010-11-09 NOTE — H&P (Signed)
NAME:  JONG, NELLS                         ACCOUNT NO.:  [id number]   MEDICAL RECORD NO.:  [id number]                   PATIENT TYPE:  INP   LOCATION:  NA                                   [facility]ACILITY[facility]   PHYSICIAN:  Gaynelle Arabian, M.D.                 DATE OF BIRTH:  12-Apr-1946   DATE OF ADMISSION:  01/03/2003  DATE OF DISCHARGE:                                HISTORY & PHYSICAL   CHIEF COMPLAINT:  Right knee pain.   HISTORY OF PRESENT ILLNESS:  The patient is a 65 year old male who has been  seen by Dr. Wynelle Link for right knee pain and known history of bilateral knee  arthritis. He has undergone Synvisc injections back in June of 2001. He did  well up to about a year ago when he started having progressive knee pain,  especially on the right. Over the past 6 months, the knee pain has been  quite severe. He does have significant pain throughout the day and also some  at night. He started having some difficulties with his mobility, his daily  activities, and also his recreational activities. He is at a point now where  he would like to have something done. He is seen in the office where x-ray's  show severe end stage arthritis, especially in the medial compartment of the  right knee and the patella femoral compartments. Also noted varus deformity.  It is felt that he had reached a point where benefit __________ .  Replacement risks and benefits discussed and patient has elected to proceed  with surgery.   ALLERGIES:  No known drug allergies.   CURRENT MEDICATIONS:  Procardia XL 60 mg daily, Flomax 0.4 mg 2 tablets p.o.  q.h.s., aspirin 325 mg p.o. q.d. and stop prior to surgery. Lipitor 10 mg  daily.   PAST MEDICAL HISTORY:  Hypertension, hypercholesterolemia, benign prostatic  hypertrophy, degenerative joint disease.   PAST SURGICAL HISTORY:  Back surgery x2, levels of L2 and L3 for herniated  disk and ruptured disks.   FAMILY HISTORY:  Sister with a history of  hypertension and diabetes.   SOCIAL HISTORY:  Married. He works as a Tree surgeon. Approximately 12  cigarettes a day. No alcohol. Has 4 children. Lives in a one story home with  no steps entering.   REVIEW OF SYSTEMS:  GENERAL: No fever, chills, night sweats. NEUROLOGIC: No  seizures, syncope, or paralysis. RESPIRATORY: No shortness of breath,  productive cough, or hemoptysis. CARDIOVASCULAR: No chest pain, angina, or  orthopnea. GASTROINTESTINAL: NO nausea, vomiting, diarrhea, or constipation.  GENITOURINARY: He does have some urinary frequency and urgency with  nocturia, due to his benign prostatic hypertrophy. Denies any dysuria or  hematuria at this time. MUSCULOSKELETAL: Pertinent for that of the knee  found in history of present illness.   PHYSICAL EXAMINATION:  VITAL SIGNS: Pulse 76, respirations 12, blood  pressure 145/88.  GENERAL:  The patient is a 65 year old male, tall frame. Well nourished and  well developed. In no acute distress.  HEENT: Normocephalic, atraumatic. Pupils are round and reactive. Oropharynx  is clear. EOM's are intact. The patient does have upper partial dentures.  NECK: Supple.  CHEST: Clear to auscultation anterior and posterior chest walls. No rhonchi,  rales, or wheezing.  HEART: Regular rate and rhythm. No murmurs. S1 and S2 noted.  ABDOMEN: Soft. Slightly round abdomen. Nontender. Bowel sounds present.  RECTAL/BREAST/GENITALIA: Not done. Not pertinent to present illness.  EXTREMITIES: __________ to the right knee. He has a varus deformity with  range of motion of 5 to 110 degrees with marked crepitus noted. Motor  function is intact.   LABORATORY DATA:  X-ray's of the right knee show end stage arthritis with  bone on bone changes.   IMPRESSION:  1. Osteoarthritis, right knee.  2. Hypertension.  3. Hypercholesterolemia.  4. Benign prostatic hypertrophy.  5. Lumbar degenerative disk disease.   PLAN:  The patient will be admitted to [facility] to undergo a  right total knee replacement arthroplasty. Surgery will be performed by Dr.  Gaynelle Arabian. The patient has been seen by Dr. Delilah Shan preoperatively and  agrees with proceeding with right knee surgery. Dr. Delilah Shan will be  consulted if need for any medical assistance with this patient throughout  the hospital course. The patient does have a history of benign prostatic  hypertrophy. The patient's urologist is Dr. Janice Norrie. Dr. Janice Norrie will be consulted  if needed for any assistance with this patient throughout the hospital  course.     Alexzandrew L. Dara Lords, P.A.              Gaynelle Arabian, M.D.    ALP/MEDQ  D:  01/02/2003  T:  01/02/2003  Job:  [id number]   cc:   Hector Shade, M.D.  [place], [place]   Hanley Ben, M.D.  [place]. [address] [place]  [place] [address]  Fax: [phone]

## 2011-09-28 ENCOUNTER — Ambulatory Visit: Payer: Self-pay

## 2011-10-26 ENCOUNTER — Encounter: Payer: 59 | Attending: Nurse Practitioner | Admitting: *Deleted

## 2011-10-26 DIAGNOSIS — Z713 Dietary counseling and surveillance: Secondary | ICD-10-CM | POA: Insufficient documentation

## 2011-10-26 DIAGNOSIS — E119 Type 2 diabetes mellitus without complications: Secondary | ICD-10-CM | POA: Insufficient documentation

## 2011-10-26 NOTE — Patient Instructions (Signed)
Goals:  Follow Diabetes Meal Plan as instructed  Eat 3 meals and 2 snacks, every 3-5 hrs  Limit carbohydrate intake to 45-60 grams carbohydrate/meal  Limit carbohydrate intake to 0-30 grams carbohydrate/snack  Add lean protein foods to meals/snacks  Monitor glucose levels as instructed by your doctor  Aim for 30 mins of physical activity daily as tolerated  Bring food record and glucose log to your next nutrition visit

## 2011-10-26 NOTE — Progress Notes (Signed)
Patient was seen on 10/26/2011 for the complete series of three diabetes self-management courses at the [facility]. The following learning objectives were met by the patient during this course:  Core 1:  Gain an understanding of diabetes and what causes it  Learn how diabetes is treated and the goals of treatment  Learn why, how and when to test BG  Learn how carbohydrate affects your glucose  Receive a personal food plan and learn how to count carbohydrates  Discover how physical activity enhances glucose control and overall health  Begin to gain confidence in your ability to manage diabetes  Handouts given during this class include:  Type 2 Diabetes: Basics Book  My [place] and Activity Log  Core 2:  Describe causes, symptoms and treatment of hypoglycemia and hyperglycemia  Learn how to care for your glucose meter and strips  Explain how to manage diabetes during illness  List strategies to follow meal plan when dining out  Describe the effects of alcohol on glucose and how to use it safely  Describe problem solving skills for day-to-day glucose challenges  Describe ways to remain physically active  Describe the impact of regular activity on insulin resistance  Handouts given in this class:  Refrigerator magnet for Sick Day Guidelines  [facility] Oral Medication and Insulin handout  Core 3  Describe how diabetes changes over time  Understand why glucose may be out of target  Learn how diabetes changes over time  Learn about blood pressure, cholesterol, and heart health  Learn about lowering dietary fat and sodium  Understand the benefits of physical activity for heart health  Develop problem solving skills for times when glucose numbers are puzzling  Develop strategies for creating life balance  Learn how to identify if your treatment plan needs to change  Develop strategies for dealing with stress, depression  and staying motivated  Identify healthy weight-loss plans  Gain confidence that you can succeed in caring for your diabetes  Establish 2-3 goals that they will plan to diligently work on until they return                   for the free 63-month follow-up visit   The following handouts were given in class:  3 Month Follow Up Visit handout  Goal setting handout  Class evaluation form  Your patient has established the following 3 month goals for diabetes self-care:  Reduce fat in my diet by eating less sweets at two or more meals a day  Increase my activity at least 3 days a week  Follow-Up Plan: Patient was offered a 3 month follow-up visit for diabetes self-management education.

## 2012-02-19 ENCOUNTER — Encounter (HOSPITAL_COMMUNITY): Payer: Self-pay | Admitting: Pharmacy Technician

## 2012-03-03 ENCOUNTER — Ambulatory Visit (HOSPITAL_COMMUNITY)
Admission: RE | Admit: 2012-03-03 | Discharge: 2012-03-04 | Disposition: A | Discharge status: Home / Self Care | Payer: 59 | Source: Ambulatory Visit | Attending: Cardiology | Admitting: Cardiology

## 2012-03-03 ENCOUNTER — Encounter (HOSPITAL_COMMUNITY)
Admission: RE | Disposition: A | Discharge status: Home / Self Care | Payer: Self-pay | Source: Ambulatory Visit | Attending: Cardiology

## 2012-03-03 DIAGNOSIS — E785 Hyperlipidemia, unspecified: Secondary | ICD-10-CM | POA: Insufficient documentation

## 2012-03-03 DIAGNOSIS — R0602 Shortness of breath: Secondary | ICD-10-CM | POA: Insufficient documentation

## 2012-03-03 DIAGNOSIS — I10 Essential (primary) hypertension: Secondary | ICD-10-CM | POA: Insufficient documentation

## 2012-03-03 DIAGNOSIS — I2510 Atherosclerotic heart disease of native coronary artery without angina pectoris: Secondary | ICD-10-CM | POA: Insufficient documentation

## 2012-03-03 DIAGNOSIS — Z955 Presence of coronary angioplasty implant and graft: Secondary | ICD-10-CM

## 2012-03-03 DIAGNOSIS — R9431 Abnormal electrocardiogram [ECG] [EKG]: Secondary | ICD-10-CM | POA: Insufficient documentation

## 2012-03-03 DIAGNOSIS — E669 Obesity, unspecified: Secondary | ICD-10-CM | POA: Insufficient documentation

## 2012-03-03 HISTORY — PX: PERCUTANEOUS CORONARY STENT INTERVENTION (PCI-S): SHX5485

## 2012-03-03 HISTORY — PX: LEFT HEART CATHETERIZATION WITH CORONARY ANGIOGRAM: SHX5451

## 2012-03-03 LAB — BASIC METABOLIC PANEL
BUN: 23 mg/dL (ref 6–23)
CO2: 27 mEq/L (ref 19–32)
Calcium: 8.7 mg/dL (ref 8.4–10.5)
Chloride: 103 mEq/L (ref 96–112)
Creatinine, Ser: 1.3 mg/dL (ref 0.50–1.35)
GFR calc Af Amer: 64 mL/min — ABNORMAL LOW (ref >90)
GFR calc non Af Amer: 55 mL/min — ABNORMAL LOW (ref >90)
Glucose, Bld: 105 mg/dL — ABNORMAL HIGH (ref 70–99)
Potassium: 3.1 mEq/L — ABNORMAL LOW (ref 3.5–5.1)
Sodium: 140 mEq/L (ref 135–145)

## 2012-03-03 LAB — APTT: aPTT: 29 seconds (ref 24–37)

## 2012-03-03 LAB — CBC
HCT: 39.3 % (ref 39.0–52.0)
Hemoglobin: 13.4 g/dL (ref 13.0–17.0)
MCH: 29.6 pg (ref 26.0–34.0)
MCHC: 34.1 g/dL (ref 30.0–36.0)
MCV: 86.9 fL (ref 78.0–100.0)
Platelets: 169 10*3/uL (ref 150–400)
RBC: 4.52 MIL/uL (ref 4.22–5.81)
RDW: 13.2 % (ref 11.5–15.5)
WBC: 7.5 10*3/uL (ref 4.0–10.5)

## 2012-03-03 LAB — GLUCOSE, CAPILLARY
Glucose-Capillary: 100 mg/dL — ABNORMAL HIGH (ref 70–99)
Glucose-Capillary: 109 mg/dL — ABNORMAL HIGH (ref 70–99)
Glucose-Capillary: 145 mg/dL — ABNORMAL HIGH (ref 70–99)
Glucose-Capillary: 103 mg/dL — ABNORMAL HIGH (ref 70–99)

## 2012-03-03 LAB — POCT ACTIVATED CLOTTING TIME: Activated Clotting Time: 524 seconds

## 2012-03-03 LAB — PROTIME-INR
INR: 1.12 (ref 0.00–1.49)
Prothrombin Time: 14.6 seconds (ref 11.6–15.2)

## 2012-03-03 SURGERY — PERCUTANEOUS CORONARY STENT INTERVENTION (PCI-S)

## 2012-03-03 MED ORDER — NITROGLYCERIN 0.2 MG/ML ON CALL CATH LAB
INTRAVENOUS | Status: AC
  Filled 2012-03-03: qty 1

## 2012-03-03 MED ORDER — SODIUM CHLORIDE 0.9 % IJ SOLN: 3.0000 mL | INTRAMUSCULAR | Status: DC | PRN

## 2012-03-03 MED ORDER — METOPROLOL TARTRATE 25 MG PO TABS
25.0000 mg | ORAL_TABLET | Freq: Two times a day (BID) | ORAL | Status: DC
  Filled 2012-03-03 (×2): qty 1

## 2012-03-03 MED ORDER — HYDROMORPHONE HCL PF 1 MG/ML IJ SOLN: 0.5000 mg | Freq: Once | INTRAMUSCULAR | Status: DC

## 2012-03-03 MED ORDER — METFORMIN HCL 500 MG PO TABS
500.0000 mg | ORAL_TABLET | Freq: Every day | ORAL | Status: DC
  Filled 2012-03-03 (×2): qty 1

## 2012-03-03 MED ORDER — IRBESARTAN 300 MG PO TABS
300.0000 mg | ORAL_TABLET | Freq: Every day | ORAL | Status: DC
  Filled 2012-03-03: qty 1

## 2012-03-03 MED ORDER — BIVALIRUDIN 250 MG IV SOLR
INTRAVENOUS | Status: AC
  Filled 2012-03-03: qty 250

## 2012-03-03 MED ORDER — ACETAMINOPHEN 325 MG PO TABS: 650.0000 mg | ORAL_TABLET | ORAL | Status: DC | PRN

## 2012-03-03 MED ORDER — SODIUM CHLORIDE 0.9 % IV SOLN
INTRAVENOUS | Status: DC
  Administered 2012-03-03: 08:00:00 via INTRAVENOUS

## 2012-03-03 MED ORDER — VALSARTAN-HYDROCHLOROTHIAZIDE 320-25 MG PO TABS: 1.0000 | ORAL_TABLET | Freq: Every day | ORAL | Status: DC

## 2012-03-03 MED ORDER — ASPIRIN 81 MG PO CHEW: 81.0000 mg | CHEWABLE_TABLET | Freq: Every day | ORAL | Status: DC

## 2012-03-03 MED ORDER — HEPARIN (PORCINE) IN NACL 2-0.9 UNIT/ML-% IJ SOLN
INTRAMUSCULAR | Status: AC
  Filled 2012-03-03: qty 2000

## 2012-03-03 MED ORDER — HYDROMORPHONE HCL PF 1 MG/ML IJ SOLN
INTRAMUSCULAR | Status: AC
Start: 2012-03-03 — End: 2012-03-03
  Filled 2012-03-03: qty 1

## 2012-03-03 MED ORDER — ASPIRIN 81 MG PO CHEW
324.0000 mg | CHEWABLE_TABLET | ORAL | Status: AC
  Administered 2012-03-03: 324 mg via ORAL
  Filled 2012-03-03: qty 4

## 2012-03-03 MED ORDER — MIDAZOLAM HCL 2 MG/2ML IJ SOLN
INTRAMUSCULAR | Status: AC
  Filled 2012-03-03: qty 2

## 2012-03-03 MED ORDER — HYDROMORPHONE HCL PF 1 MG/ML IJ SOLN: 1.0000 mg | INTRAMUSCULAR | Status: DC | PRN

## 2012-03-03 MED ORDER — PRASUGREL HCL 10 MG PO TABS
ORAL_TABLET | ORAL | Status: AC
  Filled 2012-03-03: qty 6

## 2012-03-03 MED ORDER — POTASSIUM CHLORIDE 20 MEQ/15ML (10%) PO LIQD: 20.0000 meq | Freq: Once | ORAL | Status: DC

## 2012-03-03 MED ORDER — SODIUM CHLORIDE 0.9 % IV SOLN: 250.0000 mL | INTRAVENOUS | Status: DC

## 2012-03-03 MED ORDER — PRASUGREL HCL 10 MG PO TABS
10.0000 mg | ORAL_TABLET | Freq: Every day | ORAL | Status: DC
  Administered 2012-03-04: 10 mg via ORAL
  Filled 2012-03-03: qty 1

## 2012-03-03 MED ORDER — CEFAZOLIN SODIUM 1-5 GM-% IV SOLN
INTRAVENOUS | Status: AC
  Filled 2012-03-03: qty 50

## 2012-03-03 MED ORDER — INSULIN ASPART 100 UNIT/ML ~~LOC~~ SOLN: 0.0000 [IU] | Freq: Three times a day (TID) | SUBCUTANEOUS | Status: DC

## 2012-03-03 MED ORDER — SODIUM CHLORIDE 0.9 % IJ SOLN: 3.0000 mL | Freq: Two times a day (BID) | INTRAMUSCULAR | Status: DC

## 2012-03-03 MED ORDER — SODIUM CHLORIDE 0.9 % IV SOLN: 1.0000 mL/kg/h | INTRAVENOUS | Status: AC

## 2012-03-03 MED ORDER — LIDOCAINE HCL (PF) 1 % IJ SOLN
INTRAMUSCULAR | Status: AC
  Filled 2012-03-03: qty 30

## 2012-03-03 MED ORDER — ONDANSETRON HCL 4 MG/2ML IJ SOLN: 4.0000 mg | Freq: Four times a day (QID) | INTRAMUSCULAR | Status: DC | PRN

## 2012-03-03 MED ORDER — METOPROLOL TARTRATE 50 MG PO TABS
50.0000 mg | ORAL_TABLET | Freq: Once | ORAL | Status: AC
  Administered 2012-03-03: 20:00:00 50 mg via ORAL
  Filled 2012-03-03: qty 1

## 2012-03-03 MED ORDER — POTASSIUM CHLORIDE CRYS ER 20 MEQ PO TBCR
EXTENDED_RELEASE_TABLET | ORAL | Status: AC
  Administered 2012-03-03: 20 meq
  Filled 2012-03-03: qty 1

## 2012-03-03 MED ORDER — SODIUM CHLORIDE 0.9 % IV SOLN
1.7500 mg/kg/h | INTRAVENOUS | Status: DC
  Filled 2012-03-03: qty 250

## 2012-03-03 MED ORDER — AMLODIPINE-ATORVASTATIN 10-10 MG PO TABS: 1.0000 | ORAL_TABLET | Freq: Every day | ORAL | Status: DC

## 2012-03-03 MED ORDER — SODIUM CHLORIDE 0.9 % IV SOLN: 250.0000 mL | INTRAVENOUS | Status: DC | PRN

## 2012-03-03 MED ORDER — ATORVASTATIN CALCIUM 10 MG PO TABS
10.0000 mg | ORAL_TABLET | Freq: Every day | ORAL | Status: DC
  Filled 2012-03-03: qty 1

## 2012-03-03 MED ORDER — HYDROCHLOROTHIAZIDE 25 MG PO TABS
25.0000 mg | ORAL_TABLET | Freq: Every day | ORAL | Status: DC
  Filled 2012-03-03: qty 1

## 2012-03-03 MED ORDER — HYDROMORPHONE HCL PF 2 MG/ML IJ SOLN
INTRAMUSCULAR | Status: AC
  Filled 2012-03-03: qty 1

## 2012-03-03 MED ORDER — AMLODIPINE BESYLATE 10 MG PO TABS
10.0000 mg | ORAL_TABLET | Freq: Every day | ORAL | Status: DC
  Filled 2012-03-03: qty 1

## 2012-03-03 MED ORDER — DUTASTERIDE 0.5 MG PO CAPS
0.5000 mg | ORAL_CAPSULE | Freq: Every day | ORAL | Status: DC
  Filled 2012-03-03: qty 1

## 2012-03-03 NOTE — CV Procedure (Addendum)
Procedure performed:  Left heart catheterization including hemodynamic monitoring of the left ventricle, LV gram,  selective right and left coronary arteriography. PTCA and stenting of the diagonal 2 branch of the LAD with implantation of a 2.5 x 15 mm Xience Xpedition drug-eluting stent.  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 Patient has  had non invasive testing which was abnormal revealing positive EKG changes with exercise, inferior wall scar and anterior ischemia with ejection fraction of 53%.  Hence is brought to the cardiac catheterization lab to evaluate her coronary anatomy for definitive diagnosis of CAD.  Hemodynamic data:  Left ventricular pressure was 118/8 with LVEDP of 17 mm mercury. Aortic pressure was 113/70 with a mean of 86 mm mercury.  Left ventricle: Performed in the RAO projection revealed LVEF of 55% grossly  Right coronary artery: Dominant. It is occluded in the proximal segment and this is chronic. The right coronary artery was not cannulated. I was unable to reach the right coronary artery due to 2 large size of the patient. However to conserve contrast, and excellent collaterals noted from the left system to the right coronary artery, it was not cannulated.  Left main coronary artery is large and normal.  Circumflex coronary artery: A large vessel giving origin to a small obtuse marginal 1. Previously placed stent in the midsegment are widely patent. Collaterals which are faint from the circumflex to the right coronary artery are evident.  LAD:  LAD gives  origin to a moderate diagonal-1 and D2.  LAD has mild luminal irregularities. Second diagonal has a ulcerated 80-90% stenosis. Ostium shows a 40-50% stenosis which is unchanged from 2006 with mild ectasia in the proximal segment of the stenosis.  Interventional data: Successful PTCA and stenting of the D2 branch of LAD with implantation of a 2.5 x 15 mm Xience xpedition drug-eluting stent. Stenosis reduced from 80-90% to 0% with brisk TIMI-3 flow.  Recommendation: Patient will be started on Effient and will need dual antiplatelet therapy with aspirin for at least a period of a year. Patient will be discharged home in the morning if he remains stable.  Complication: Perclose closure of the right femoral arterial access was a failure as the suture came loose. Hence FemoStop had to be applied. Patient will be watched carefully for any groin complications specifically bleeding complications. Otherwise no complications were evident in the cardiac catheterization lab. Patient tolerated the procedure well.   Technique: Under sterile precautions using a 6 French right radial  arterial access, a 6 French sheath was introduced into the right radial artery. A 5 [place] Tig 4 catheter was advanced into the ascending aorta selective  left coronary artery was cannulated and angiography was performed in multiple views. The same catheter was advanced into the left ventricle and left ventriculography was performed in the RAO projection. I was unable to cannulate the right coronary artery at the catheter designated to the descending aorta and there was no ability to manipulate due to proximal tortuosity in the subclavian artery and large body size.  Catheter exchanged out of the body over J-Wire. NO immediate complications  noted. Patient tolerated the procedure well. I attempted to do perform angioplasty via right radial arterial access, however in spite of utilization of XB 4.0 and XB 4.5 guide catheter, I was unable to  reach the coronary arteries due to large size of the patient. Hence I switched over to femoral arterial access. I also used Amplatzer stiff wire to straighten the curve but still procedure was unsuccessful through radial route.  I then accessed the right femoral artery using micropuncture technique under fluoroscopic guidance. A 6 French sheath was introduced into the common femoral artery.   Technique of intervention:  Using a 6 [place] XB 4 guide catheter the left main  coronary  was selected and cannulated. Using Angiomax for anticoagulation, I utilized a Physicist, medical and across the D2 branch of the LAD coronary artery with moderate amount of  difficulty. I placed the tip of the wire into the distal  coronary artery. Angiography was performed.   Then I utilized a 2.5 x 12 mm [place] treck balloon , I performed balloon angioplasty at 10 atmospheric pressure pressure x 30 seconds.  I proceeded with implantation of a 2.5 x 15 mm Xience  drug-eluting stent into the D2 branch of LAD coronary artery. The stent was deployed at 16 atmospheric pressure for 45  seconds. The stent was then post dilated with a 2.5x12 mm [place] balloon. At 18 atmospheric pressure for 40 seconds. Post-balloon angioplasty results were excellent with 0% residual stenoses and TIMI-3 flow was maintained. There was no evidence of edge dissection. The guidewire was withdrawn out of the body and the guide catheter was engaged and pulled out of the body over the J-wire the was no immediate complication. Patient tolerated the procedure well.

## 2012-03-03 NOTE — Interval H&P Note (Signed)
History and Physical Interval Note:  03/03/2012 8:32 AM  Gregory [name]  has presented today for surgery, with the diagnosis of cp  The various methods of treatment have been discussed with the patient and family. After consideration of risks, benefits and other options for treatment, the patient has consented to  Procedure(s) (LRB) with comments: LEFT HEART CATHETERIZATION WITH CORONARY ANGIOGRAM (N/A) and possible angioplasty as a surgical intervention .  The patient's history has been reviewed, patient examined, no change in status, stable for surgery.  I have reviewed the patient's chart and labs.  Questions were answered to the patient's satisfaction.     Laverda Page

## 2012-03-03 NOTE — H&P (Signed)
  Please see paper chart  

## 2012-03-04 ENCOUNTER — Encounter (HOSPITAL_COMMUNITY): Payer: Self-pay

## 2012-03-04 LAB — CBC
HCT: 37.7 % — ABNORMAL LOW (ref 39.0–52.0)
Hemoglobin: 12.4 g/dL — ABNORMAL LOW (ref 13.0–17.0)
MCH: 28.8 pg (ref 26.0–34.0)
MCHC: 32.9 g/dL (ref 30.0–36.0)
MCV: 87.7 fL (ref 78.0–100.0)
Platelets: 161 10*3/uL (ref 150–400)
RBC: 4.3 MIL/uL (ref 4.22–5.81)
RDW: 13.4 % (ref 11.5–15.5)
WBC: 8.5 10*3/uL (ref 4.0–10.5)

## 2012-03-04 LAB — BASIC METABOLIC PANEL
BUN: 16 mg/dL (ref 6–23)
CO2: 30 mEq/L (ref 19–32)
Calcium: 9.1 mg/dL (ref 8.4–10.5)
Chloride: 104 mEq/L (ref 96–112)
Creatinine, Ser: 1.22 mg/dL (ref 0.50–1.35)
GFR calc Af Amer: 69 mL/min — ABNORMAL LOW (ref >90)
GFR calc non Af Amer: 60 mL/min — ABNORMAL LOW (ref >90)
Glucose, Bld: 117 mg/dL — ABNORMAL HIGH (ref 70–99)
Potassium: 3.1 mEq/L — ABNORMAL LOW (ref 3.5–5.1)
Sodium: 142 mEq/L (ref 135–145)

## 2012-03-04 MED ORDER — PRASUGREL HCL 10 MG PO TABS: 10.0000 mg | ORAL_TABLET | Freq: Every day | ORAL | Status: DC

## 2012-03-04 MED ORDER — CARVEDILOL 25 MG PO TABS: ORAL_TABLET | ORAL | Status: DC

## 2012-03-04 MED FILL — Dextrose Inj 5%: INTRAVENOUS | Qty: 1000 | Status: AC

## 2012-03-04 NOTE — Progress Notes (Signed)
CARDIAC REHAB PHASE I   PRE:  Rate/Rhythm: 60 SR    BP: sitting 146/86    SaO2:   MODE:  Ambulation: 480 ft   POST:  Rate/Rhythm: 93 SR    BP: sitting 163/95, 146/86 25 min later     SaO2:   Pt sts no more SOB like he was having. Feels better. Has knee trouble on left. Ed completed, encouraging CRPII. Agrees to referral to [facility].  [id number]  Darrick Meigs CES, ACSM

## 2012-03-04 NOTE — Discharge Summary (Signed)
Physician Discharge Summary  Patient ID: Gregory [name] MRN: [id number] DOB/AGE: 23-Feb-1946 66 y.o.  Admit date: 03/03/2012 Discharge date: 03/04/2012  Primary Discharge Diagnosis CAD S/P PTCA and stenting of D-2 with 2.5x15  Mm Xience Xpedition DES on 03/03/12. history of stenting to the mid circumflex coronary artery with 3.5 x 18 mm overlap with a 3.5 x 12 mm drug-eluting stent that was placed in 2006  Secondary Discharge Diagnosis Hypertension Hyperlipidemia Obesity  Significant Diagnostic Studies: Cardiac catheterization is dictated above and angioplasty to D-2 branch of LAD  Consults:   Hospital Course: Patient was admitted for elective coronary angiography and underwent successful angioplasty to the diagonal second branch without any complications. His main issue was inability for the guide catheter to reach the coronary arteries via radial arterial access hence I had to access his right femoral artery. Closure of right femoral arterial access with Perclose was unsuccessful due to tair of the suture, hence FemoStop had to be applied. However patient overnight did well without any hematoma of the groin. He was stable the following day. His blood pressure still remains slightly elevated especially after templating. I'll follow this up in the outpatient basis.  Discharge Exam: Blood pressure 135/82, pulse 72, temperature 98.2 F (36.8 C), temperature source Oral, resp. rate 19, height 6\' 3"  (1.905 m), weight 123.4 kg (272 lb 0.8 oz), SpO2 99.00%.     General appearance: alert, cooperative, appears stated age, no distress and moderately obese Resp: clear to auscultation bilaterally Cardio: regular rate and rhythm, S1, S2 normal, no murmur, click, rub or gallop Extremities: extremities normal, atraumatic, no cyanosis or edema and Right radial arterial access and right femoral arterial access site a stable without any hematoma or bruit. Labs:   Lab Results  Component Value Date   WBC  8.5 03/04/2012   HGB 12.4* 03/04/2012   HCT 37.7* 03/04/2012   MCV 87.7 03/04/2012   PLT 161 03/04/2012    Lab 03/04/12 0625  NA 142  K 3.1*  CL 104  CO2 30  BUN 16  CREATININE 1.22  CALCIUM 9.1  PROT --  BILITOT --  ALKPHOS --  ALT --  AST --  GLUCOSE 117*    EKG: Performed on 03/04/2012 reveals normal sinus rhythm, normal axis, borderline criteria for LVH, normal intervals and frequent PVCs. ST-T wave changes in the lateral leads cannot be completely deciphered do to PVCs.  FOLLOW UP PLANS AND APPOINTMENTS    Medication List     As of 03/04/2012  8:48 AM    STOP taking these medications         ibuprofen 200 MG tablet   Commonly known as: ADVIL,MOTRIN      TAKE these medications         amlodipine-atorvastatin 10-10 MG per tablet   Commonly known as: CADUET   Take 1 tablet by mouth daily.      aspirin 81 MG tablet   Take 81 mg by mouth daily.      carvedilol 25 MG tablet   Commonly known as: COREG   Take two tablet twice daily.      CENTRUM SILVER PO   Take 1 tablet by mouth daily.      dutasteride 0.5 MG capsule   Commonly known as: AVODART   Take 0.5 mg by mouth daily.      metFORMIN 500 MG tablet   Commonly known as: GLUCOPHAGE   Take 500 mg by mouth daily.      prasugrel 10  MG Tabs   Commonly known as: EFFIENT   Take 1 tablet (10 mg total) by mouth daily.      valsartan-hydrochlorothiazide 320-25 MG per tablet   Commonly known as: DIOVAN-HCT   Take 1 tablet by mouth daily.           Follow-up Information    Follow up with Laverda Page, MD. In 2 weeks. (Keep previous appointment)    Contact information:   [id number] [place] [address] [phone]           Laverda Page, MD 03/04/2012, 8:48 AM

## 2012-03-04 NOTE — Progress Notes (Signed)
Assessed for utilization review.

## 2012-07-07 ENCOUNTER — Ambulatory Visit (INDEPENDENT_AMBULATORY_CARE_PROVIDER_SITE_OTHER): Payer: Self-pay | Admitting: Family Medicine

## 2012-07-07 ENCOUNTER — Ambulatory Visit: Payer: 59 | Admitting: Family Medicine

## 2012-07-07 DIAGNOSIS — E119 Type 2 diabetes mellitus without complications: Secondary | ICD-10-CM

## 2012-08-05 NOTE — Progress Notes (Signed)
Patient presents today for 3 mo DM follow-up as part of the employer-sponsored Link to Wellness program.  Medications, glucose readings, and lifestyle factors including diet and exercise have been reviewed.  Details of this visit may be found in [facility] ([facility]) documenting program through [facility]).  Patient has set a series of personal goals and will follow-up in 3 months for further review of DM.

## 2012-09-14 NOTE — Progress Notes (Signed)
Patient ID: Gregory [name], male   DOB: 08-28-1945, 67 y.o.   MRN: [id number] ATTENDING PHYSICIAN NOTE: I have reviewed the chart and agree with the plan as detailed above. Dorcas Mcmurray MD Pager [phone]

## 2012-09-22 ENCOUNTER — Telehealth: Payer: Self-pay | Admitting: Pulmonary Disease

## 2012-09-22 NOTE — Telephone Encounter (Signed)
I spoke with spouse. Nelson had an opening 09/23/12 at 3:15. Spouse is aware to have pt arrive 15 minutes early. She voiced her understanding and needed nothing further

## 2012-09-23 ENCOUNTER — Other Ambulatory Visit (INDEPENDENT_AMBULATORY_CARE_PROVIDER_SITE_OTHER): Payer: 59

## 2012-09-23 ENCOUNTER — Encounter: Payer: Self-pay | Admitting: Pulmonary Disease

## 2012-09-23 ENCOUNTER — Ambulatory Visit (INDEPENDENT_AMBULATORY_CARE_PROVIDER_SITE_OTHER)
Admission: RE | Admit: 2012-09-23 | Discharge: 2012-09-23 | Disposition: A | Discharge status: Home / Self Care | Payer: 59 | Source: Ambulatory Visit | Attending: Pulmonary Disease | Admitting: Pulmonary Disease

## 2012-09-23 ENCOUNTER — Ambulatory Visit (INDEPENDENT_AMBULATORY_CARE_PROVIDER_SITE_OTHER): Payer: 59 | Admitting: Pulmonary Disease

## 2012-09-23 VITALS — BP 122/84 | HR 66 | Temp 98.3°F | Ht 74.0 in | Wt 266.4 lb

## 2012-09-23 DIAGNOSIS — R0600 Dyspnea, unspecified: Secondary | ICD-10-CM

## 2012-09-23 DIAGNOSIS — R0609 Other forms of dyspnea: Secondary | ICD-10-CM | POA: Insufficient documentation

## 2012-09-23 DIAGNOSIS — R0989 Other specified symptoms and signs involving the circulatory and respiratory systems: Secondary | ICD-10-CM

## 2012-09-23 DIAGNOSIS — J439 Emphysema, unspecified: Secondary | ICD-10-CM | POA: Insufficient documentation

## 2012-09-23 DIAGNOSIS — J438 Other emphysema: Secondary | ICD-10-CM

## 2012-09-23 HISTORY — DX: Other forms of dyspnea: R06.09

## 2012-09-23 NOTE — Assessment & Plan Note (Signed)
The patient has moderate airflow obstruction by his spirometry today, although this may be under estimating his true obstruction because of his decreased FVC.  He has a long history of smoking, and would benefit from an aggressive bronchodilator regimen.  He does not have any active wheezing on exam today, and we'll therefore not treat him with a course of prednisone given his history of diabetes.

## 2012-09-23 NOTE — Patient Instructions (Addendum)
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 Start on symbicort 160/4.5  2 inhalations am and pm everyday no matter what.  Rinse mouth well. Start spiriva one inhalation each am everyday. Can use albuterol inhaler only for emergencies.  Take 2 puffs every 6 hrs ONLY for rescue. Will start on oxygen at 2 liters with exertional activities and during sleep Will do a scan of your chest to evaluate for blood clots, and also to see if anything else to explain your shortness of breath.  Will call you with results, and arrange followup with me.

## 2012-09-23 NOTE — Progress Notes (Signed)
  Subjective:    Patient ID: Gregory [name], male    DOB: [birth year], 67 y.o.   MRN: [id number]  HPI The patient is a 67 year old male who been asked to see for dyspnea.  He tells me that he was in his usual state of health with no significant shortness of breath until approximately 6 months ago.  Since that time, he has had progressive symptoms, and in the last few weeks has gotten to the point where he will get short of breath just walking through his house.  He describes a less than one block dyspnea on exertion at a moderate pace on flat ground, and will get winded ringing groceries in from the car or walking up one flight of stairs.  He denies any chest congestion or the feeling that he has a chest cold.  He will cough up a thumbnail size piece of mucus intermittently.  He has also had some headache, malaise, as well as fatigue.  He has not had significant cough or hemoptysis.  He denies pleuritic chest pain, and has had no worsening lower extremity edema.  He denies any history of thromboembolic disease.  He has not had any significant weight change, nor has he had a recent chest x-ray.  He had a cardiac catheterization last September that did show coronary disease, and he underwent percutaneous intervention.  His ejection fraction at that time was 55%.  He has seen his cardiologist recently, and was told that his cardiac status appeared to be stable.   Review of Systems  Constitutional: Positive for appetite change and unexpected weight change. Negative for fever.  HENT: Negative for ear pain, nosebleeds, congestion, sore throat, rhinorrhea, sneezing, trouble swallowing, dental problem, postnasal drip and sinus pressure.   Eyes: Negative for redness and itching.  Respiratory: Positive for cough and shortness of breath. Negative for chest tightness and wheezing.   Cardiovascular: Negative for palpitations and leg swelling.  Gastrointestinal: Negative for nausea and vomiting.  Genitourinary: Negative  for dysuria.  Musculoskeletal: Negative for joint swelling.  Skin: Negative for rash.  Neurological: Positive for headaches.  Hematological: Does not bruise/bleed easily.  Psychiatric/Behavioral: Negative for dysphoric mood. The patient is not nervous/anxious.        Objective:   Physical Exam Constitutional:  Overweight male, no acute distress  HENT:  Nares patent without discharge  Oropharynx without exudate, palate and uvula are elongated  Eyes:  Perrla, eomi, no scleral icterus  Neck:  No JVD, no TMG  Cardiovascular:  Normal rate, regular rhythm, no rubs or gallops.  No murmurs        Intact distal pulses  Pulmonary :  Adequate airflow, no stridor or respiratory distress   Mild crackles right base, significant crackles 1/3 up on left  Abdominal:  Soft, nondistended, bowel sounds present.  No tenderness noted.   Musculoskeletal:  mild lower extremity edema noted.  Lymph Nodes:  No cervical lymphadenopathy noted  Skin:  No cyanosis noted  Neurologic:  Alert, appropriate, moves all 4 extremities without obvious deficit.         Assessment & Plan:

## 2012-09-23 NOTE — Assessment & Plan Note (Signed)
The patient has very significant dyspnea on exertion that has now gotten to the point of interfering with his quality of life.  He has moderate air flow obstruction on pulmonary function studies, and his chest x-ray only shows basilar atelectasis.  He also has exertional oxygen desaturation, and will need to be started on oxygen.  I am concerned about the rather rapid progression of his dyspnea symptoms, and this is usually atypical for COPD.  Also, his moderate obstruction is out of proportion to his severity of symptoms.  Because of this, I think we also need to look for other issues, such as thromboembolic disease or possibly progression of his coronary disease.  If these are excluded, then I would assume his spirometry is underestimating his degree of airflow obstruction.  Will do ct angio to r/o PE, and discuss with Dr. Einar Gip if unremarkable.

## 2012-09-24 ENCOUNTER — Ambulatory Visit (HOSPITAL_COMMUNITY)
Admission: RE | Admit: 2012-09-24 | Discharge: 2012-09-24 | Disposition: A | Discharge status: Home / Self Care | Payer: 59 | Source: Ambulatory Visit | Attending: Pulmonary Disease | Admitting: Pulmonary Disease

## 2012-09-24 ENCOUNTER — Encounter (HOSPITAL_COMMUNITY): Payer: Self-pay

## 2012-09-24 ENCOUNTER — Ambulatory Visit: Admission: RE | Admit: 2012-09-24 | Payer: 59 | Source: Ambulatory Visit

## 2012-09-24 ENCOUNTER — Other Ambulatory Visit: Payer: Self-pay | Admitting: Pulmonary Disease

## 2012-09-24 DIAGNOSIS — R0600 Dyspnea, unspecified: Secondary | ICD-10-CM

## 2012-09-24 DIAGNOSIS — R0989 Other specified symptoms and signs involving the circulatory and respiratory systems: Secondary | ICD-10-CM | POA: Insufficient documentation

## 2012-09-24 DIAGNOSIS — R0609 Other forms of dyspnea: Secondary | ICD-10-CM | POA: Insufficient documentation

## 2012-09-24 HISTORY — DX: Shortness of breath: R06.02

## 2012-09-24 LAB — BASIC METABOLIC PANEL
BUN: 39 mg/dL — ABNORMAL HIGH (ref 6–23)
CO2: 32 mEq/L (ref 19–32)
Calcium: 8.7 mg/dL (ref 8.4–10.5)
Chloride: 90 mEq/L — ABNORMAL LOW (ref 96–112)
Creatinine, Ser: 3.4 mg/dL — ABNORMAL HIGH (ref 0.4–1.5)
GFR: 23.69 mL/min — ABNORMAL LOW (ref >60.00)
Glucose, Bld: 102 mg/dL — ABNORMAL HIGH (ref 70–99)
Potassium: 3.2 mEq/L — ABNORMAL LOW (ref 3.5–5.1)
Sodium: 132 mEq/L — ABNORMAL LOW (ref 135–145)

## 2012-09-24 MED ORDER — TECHNETIUM TC 99M DIETHYLENETRIAME-PENTAACETIC ACID: 42.6000 | Freq: Once | INTRAVENOUS | Status: AC | PRN

## 2012-09-24 MED ORDER — TECHNETIUM TO 99M ALBUMIN AGGREGATED
5.5000 | Freq: Once | INTRAVENOUS | Status: AC | PRN
  Administered 2012-09-24: 5.5 via INTRAVENOUS

## 2012-09-28 ENCOUNTER — Encounter: Payer: Self-pay | Admitting: Pulmonary Disease

## 2012-09-28 NOTE — Telephone Encounter (Signed)
4.7.14 mychart message copied from original:  To Kathee Delton, MD [P [phone]   Composed 09/28/2012 4:44 PM   For Delivery On 09/28/2012 4:44 PM   Subject Non-Urgent Medical Question   Message Type Patient Medical Advice Request   Read Status Y   Message Body Dr. Gwenette Greet, since I don't have any issues with my lungs ,do I still need to use Symbicort, Spiriva and the Oxygen?   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  Start on symbicort 160/4.5 2 inhalations am and pm everyday no matter what. Rinse mouth well.  Start spiriva one inhalation each am everyday.  Can use albuterol inhaler only for emergencies. Take 2 puffs every 6 hrs ONLY for rescue.  Will start on oxygen at 2 liters with exertional activities and during sleep  Will do a scan of your chest to evaluate for blood clots, and also to see if anything else to explain your shortness of breath. Will call you with results, and arrange followup with me.     Has the patient been taking his medications and wearing the oxygen regularly as prescribed? Any respiratory symptoms since ov? If he has been following KC's recs, the lack of symptoms could be due to this regimen working well for him. Email sent to patient with these questions

## 2012-09-29 ENCOUNTER — Telehealth: Payer: Self-pay | Admitting: Pulmonary Disease

## 2012-09-29 NOTE — Telephone Encounter (Signed)
Pt did not answer the question about following KC's recs Called spoke with patient who stated that he has been following all of KC's recs > the symbicort BID, spiriva QD and O2 w/ activity at [facility] Pt stated he has been doing well since last ov with only exertional dyspnea > "like when I walk 1/2 block, I get a little winded which is the same as before" Pt stated that he saw Dr Einar Gip yesterday and was told that he does not think that the patient needs these therapies anymore Advised pt that he may be doing so well because his current regimen is working well for him Pt is requesting KC's thoughts and recommendations Dr Gwenette Greet please advise,thanks!

## 2012-09-29 NOTE — Telephone Encounter (Signed)
Let pt know he has copd, and the inhalers are for this.  He needs to stay on these medications until next visit.

## 2012-09-29 NOTE — Telephone Encounter (Signed)
Pt was sent this vial pt email.

## 2012-09-30 ENCOUNTER — Institutional Professional Consult (permissible substitution): Payer: 59 | Admitting: Pulmonary Disease

## 2012-10-14 ENCOUNTER — Other Ambulatory Visit: Payer: Self-pay | Admitting: Neurological Surgery

## 2012-10-14 DIAGNOSIS — M549 Dorsalgia, unspecified: Secondary | ICD-10-CM

## 2012-10-23 ENCOUNTER — Ambulatory Visit (INDEPENDENT_AMBULATORY_CARE_PROVIDER_SITE_OTHER): Payer: 59 | Admitting: Pulmonary Disease

## 2012-10-23 ENCOUNTER — Encounter: Payer: Self-pay | Admitting: Pulmonary Disease

## 2012-10-23 VITALS — BP 100/70 | HR 67 | Temp 97.7°F | Ht 75.0 in | Wt 271.2 lb

## 2012-10-23 DIAGNOSIS — J439 Emphysema, unspecified: Secondary | ICD-10-CM

## 2012-10-23 DIAGNOSIS — R0600 Dyspnea, unspecified: Secondary | ICD-10-CM

## 2012-10-23 DIAGNOSIS — R0989 Other specified symptoms and signs involving the circulatory and respiratory systems: Secondary | ICD-10-CM

## 2012-10-23 DIAGNOSIS — R0609 Other forms of dyspnea: Secondary | ICD-10-CM

## 2012-10-23 DIAGNOSIS — J438 Other emphysema: Secondary | ICD-10-CM

## 2012-10-23 NOTE — Assessment & Plan Note (Signed)
I think his level of dyspnea on exertion is out of proportion to his degree of airflow obstruction.  Some of this is because of his weight and deconditioning, and I've asked him to work on this.  He is also still being evaluated by cardiology.

## 2012-10-23 NOTE — Progress Notes (Signed)
  Subjective:    Patient ID: Gregory [name], male    DOB: 1946/02/16, 67 y.o.   MRN: [id number]  HPI The patient comes in today for followup of his recent nuclear medicine lung scan, as well as to check on his progress with a maintenance bronchodilator regimen.  His VQ scan showed totally normal perfusion with no evidence for thromboembolic disease.  He has been maintained on symbicort and Spiriva since the last visit, and has really seen no difference in his breathing on the medications.  He is currently undergoing further testing with cardiology regarding his dyspnea.  The patient denies any significant cough or mucus production.  He has seen a big difference in his sleep at night since being on nocturnal oxygen.   Review of Systems  Constitutional: Positive for appetite change. Negative for fever and unexpected weight change.  HENT: Negative for ear pain, nosebleeds, congestion, sore throat, rhinorrhea, sneezing, trouble swallowing, dental problem, postnasal drip and sinus pressure.   Eyes: Negative for redness and itching.  Respiratory: Positive for cough and shortness of breath. Negative for chest tightness and wheezing.   Cardiovascular: Negative for palpitations and leg swelling.  Gastrointestinal: Negative for nausea and vomiting.  Genitourinary: Negative for dysuria.  Musculoskeletal: Negative for joint swelling.  Skin: Negative for rash.  Neurological: Negative for headaches.  Hematological: Does not bruise/bleed easily.  Psychiatric/Behavioral: Negative for dysphoric mood. The patient is not nervous/anxious.        Objective:   Physical Exam Obese male in no acute distress Nose without purulence or discharge noted Neck without lymphadenopathy or thyromegaly Chest fairly clear except minimal basilar crackles, no wheezing Cardiac exam with regular rate and rhythm Lower extremities with mild edema noted, no cyanosis Alert and oriented, moves all 4 extremities.        Assessment & Plan:

## 2012-10-23 NOTE — Patient Instructions (Addendum)
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  The reason for daily medication is to improve symptoms, so if you see no difference in your breathing with symbicort and spiriva, would stop and see how you do. You will need to keep albuterol available for rescue only, 2 inhalations every 6 hours only if needed for emergencies. Work on weight loss and conditioning. followup with me in 53mos, or sooner if problems arise.

## 2012-10-23 NOTE — Assessment & Plan Note (Signed)
The patient has been tried on symbicort and Spiriva the last 4 weeks, and sees absolutely no difference in his breathing.  I have explained the only reason to stay on maintenance medications is to improve symptoms and to prevent acute exacerbations.  He has not had issues with exacerbations, and because he sees no improvement with the medications, I would discontinue.  He will need to keep a rescue inhaler available for emergencies.

## 2012-12-15 ENCOUNTER — Encounter: Payer: Self-pay | Admitting: Pulmonary Disease

## 2012-12-16 ENCOUNTER — Other Ambulatory Visit: Payer: Self-pay | Admitting: *Deleted

## 2012-12-16 MED ORDER — TIOTROPIUM BROMIDE MONOHYDRATE 18 MCG IN CAPS: 18.0000 ug | ORAL_CAPSULE | Freq: Every day | RESPIRATORY_TRACT | Status: DC

## 2012-12-16 MED ORDER — BUDESONIDE-FORMOTEROL FUMARATE 160-4.5 MCG/ACT IN AERO: 2.0000 | INHALATION_SPRAY | Freq: Two times a day (BID) | RESPIRATORY_TRACT | Status: DC

## 2012-12-30 ENCOUNTER — Encounter: Payer: Self-pay | Admitting: Pulmonary Disease

## 2013-01-27 ENCOUNTER — Other Ambulatory Visit: Payer: Self-pay

## 2013-03-08 ENCOUNTER — Encounter: Payer: Self-pay | Admitting: Pulmonary Disease

## 2013-03-08 ENCOUNTER — Other Ambulatory Visit: Payer: Self-pay | Admitting: *Deleted

## 2013-03-08 MED ORDER — BUDESONIDE-FORMOTEROL FUMARATE 160-4.5 MCG/ACT IN AERO: 2.0000 | INHALATION_SPRAY | Freq: Two times a day (BID) | RESPIRATORY_TRACT | Status: DC

## 2013-03-08 MED ORDER — TIOTROPIUM BROMIDE MONOHYDRATE 18 MCG IN CAPS: 18.0000 ug | ORAL_CAPSULE | Freq: Every day | RESPIRATORY_TRACT | Status: DC

## 2013-04-26 ENCOUNTER — Ambulatory Visit (INDEPENDENT_AMBULATORY_CARE_PROVIDER_SITE_OTHER): Payer: No Typology Code available for payment source | Admitting: Pulmonary Disease

## 2013-04-26 ENCOUNTER — Encounter: Payer: Self-pay | Admitting: Pulmonary Disease

## 2013-04-26 VITALS — BP 142/82 | HR 56 | Temp 97.7°F | Ht 73.0 in | Wt 274.6 lb

## 2013-04-26 DIAGNOSIS — R0600 Dyspnea, unspecified: Secondary | ICD-10-CM

## 2013-04-26 DIAGNOSIS — J439 Emphysema, unspecified: Secondary | ICD-10-CM

## 2013-04-26 DIAGNOSIS — R0609 Other forms of dyspnea: Secondary | ICD-10-CM

## 2013-04-26 DIAGNOSIS — R0989 Other specified symptoms and signs involving the circulatory and respiratory systems: Secondary | ICD-10-CM

## 2013-04-26 DIAGNOSIS — J438 Other emphysema: Secondary | ICD-10-CM

## 2013-04-26 NOTE — Assessment & Plan Note (Signed)
The patient has seen no improvement in his breathing with a maintenance bronchodilator regimen, and therefore again I have asked him to discontinue.  He is to stay on an as needed albuterol for emergencies.

## 2013-04-26 NOTE — Patient Instructions (Signed)
Stop symbicort and spiriva for now.  Do not restart unless you see a difference in your breathing. Work on weight loss and conditioning. Stay on albuterol just as needed for rescue. followup with me in one year if doing well.

## 2013-04-26 NOTE — Assessment & Plan Note (Signed)
The patient feels that his dyspnea on exertion is stable at this time.  It is out of proportion to his degree of lung disease, and he has been cleared by cardiology as well.  I really think an aggressive weight loss and a conditioning program are his best treatment options.

## 2013-04-26 NOTE — Progress Notes (Signed)
  Subjective:    Patient ID: Gregory [name], male    DOB: [birth year], 67 y.o.   MRN: [id number]  HPI The patient comes in today for followup of his known moderate COPD, and dyspnea on exertion.  At the last visit, I had asked him to stop his maintenance bronchodilators because of no improvement in his symptoms and no acute exacerbations.  He unfortunately did not do this, but continues to believe that the medications have not helped his breathing.  He is also being followed closely by cardiology and feels there is not a major issue.  He feels that his breathing is stable from the last visit.   Review of Systems  Constitutional: Negative for fever and unexpected weight change.  HENT: Negative for congestion, dental problem, ear pain, nosebleeds, postnasal drip, rhinorrhea, sinus pressure, sneezing, sore throat and trouble swallowing.   Eyes: Negative for redness and itching.  Respiratory: Negative for cough, chest tightness, shortness of breath and wheezing.   Cardiovascular: Negative for palpitations and leg swelling.  Gastrointestinal: Negative for nausea and vomiting.  Genitourinary: Negative for dysuria.  Musculoskeletal: Negative for joint swelling.  Skin: Negative for rash.  Neurological: Negative for headaches.  Hematological: Does not bruise/bleed easily.  Psychiatric/Behavioral: Negative for dysphoric mood. The patient is not nervous/anxious.        Objective:   Physical Exam Overweight male in no acute distress Nose without purulent discharge noted Neck without lymphadenopathy or thyromegaly Chest with good air flow bilaterally, no wheezing Cardiac exam with regular rate and rhythm Lower extremities with 1+ ankle edema, no cyanosis Alert and oriented, moves all 4 extremities.       Assessment & Plan:

## 2013-04-29 ENCOUNTER — Other Ambulatory Visit: Payer: Self-pay

## 2013-07-29 ENCOUNTER — Telehealth: Payer: Self-pay | Admitting: Pulmonary Disease

## 2013-07-29 DIAGNOSIS — J439 Emphysema, unspecified: Secondary | ICD-10-CM

## 2013-07-29 NOTE — Telephone Encounter (Signed)
Spoke with pt. He does not feel like he needs the O2 and will not wear it. I advised will send D/C order. Nothing further needed

## 2013-07-29 NOTE — Telephone Encounter (Signed)
Let the pt know that he does qualify for coverage of his oxygen, we just need to send the appropriate paperwork to them.  He really would benefit from oxygen if he is willing to wear more consistently.  If he feels strongly he will not wear, please document this and send order to d/c oxygen.

## 2013-07-29 NOTE — Telephone Encounter (Signed)
Spoke with the pt and he states that his new insurance will not cover his oxygen. He states he only uses it at bedtime on an as needed basis. He states he has only used it a handful of times. He would like an order sent to [facility] to discontinue oxygen. Please advise. Boscobel Bing, CMA

## 2014-04-26 ENCOUNTER — Ambulatory Visit: Payer: No Typology Code available for payment source | Admitting: Pulmonary Disease

## 2014-05-04 ENCOUNTER — Encounter: Payer: Self-pay | Admitting: Pulmonary Disease

## 2014-05-04 ENCOUNTER — Ambulatory Visit (INDEPENDENT_AMBULATORY_CARE_PROVIDER_SITE_OTHER): Payer: Medicare HMO | Admitting: Pulmonary Disease

## 2014-05-04 VITALS — BP 132/78 | HR 65 | Temp 98.0°F | Ht 75.0 in | Wt 281.4 lb

## 2014-05-04 DIAGNOSIS — J438 Other emphysema: Secondary | ICD-10-CM

## 2014-05-04 DIAGNOSIS — R0600 Dyspnea, unspecified: Secondary | ICD-10-CM

## 2014-05-04 DIAGNOSIS — R0609 Other forms of dyspnea: Secondary | ICD-10-CM

## 2014-05-04 MED ORDER — ALBUTEROL SULFATE 108 (90 BASE) MCG/ACT IN AEPB: 2.0000 | INHALATION_SPRAY | Freq: Four times a day (QID) | RESPIRATORY_TRACT | Status: DC | PRN

## 2014-05-04 NOTE — Patient Instructions (Signed)
Will get you a rescue inhaler.  Take proair respiclick, 2 inhalations up to every 6 hrs if needed for breathing issues that do not resolve with sitting down and resting. Work on weight loss, and some kind of exercise program. followup with me again in one year.

## 2014-05-04 NOTE — Progress Notes (Signed)
   Subjective:    Patient ID: Gregory [name], male    DOB: 04-20-1946, 68 y.o.   MRN: [id number]  HPI The patient comes in today for follow-up of his known COPD. He has been tried on multiple bronchodilator regimen with no change in his breathing. He is therefore maintained on albuterol alone, but does not have an active rescue inhaler currently. He feels that his breathing is stable from last year, but continues to have chronic dyspnea on exertion. He denies any cough, congestion, or mucus production. He has gained 7 pounds since the last visit.   Review of Systems  Constitutional: Negative for fever and unexpected weight change.  HENT: Negative for congestion, dental problem, ear pain, nosebleeds, postnasal drip, rhinorrhea, sinus pressure, sneezing, sore throat and trouble swallowing.   Eyes: Negative for redness and itching.  Respiratory: Positive for cough and shortness of breath. Negative for chest tightness and wheezing.   Cardiovascular: Negative for palpitations and leg swelling.  Gastrointestinal: Negative for nausea and vomiting.  Genitourinary: Negative for dysuria.  Musculoskeletal: Negative for joint swelling.  Skin: Negative for rash.  Neurological: Negative for headaches.  Hematological: Does not bruise/bleed easily.  Psychiatric/Behavioral: Negative for dysphoric mood. The patient is not nervous/anxious.        Objective:   Physical Exam Obese male in no acute distress Nose without purulence or discharge noted Neck without lymphadenopathy or thyromegaly Chest with minimally decreased breath sounds, no wheezes or rhonchi Cardiac exam with regular rate and rhythm Lower extremities with 1+ edema bilaterally, no cyanosis Alert and oriented, moves all 4 extremities.       Assessment & Plan:

## 2014-05-04 NOTE — Assessment & Plan Note (Signed)
The patient continues to have chronic dyspnea on exertion, but it is unchanged from last year. He does have some airflow limitation, but has never responded favorably to an aggressive bronchodilator regimen. I have asked him to concentrate on weight loss and a conditioning program, but we also need to make sure that he has a rescue inhaler available.

## 2014-05-04 NOTE — Assessment & Plan Note (Signed)
I suspect this is primarily related to obesity and conditioning.

## 2014-06-02 ENCOUNTER — Encounter (HOSPITAL_COMMUNITY): Payer: Self-pay | Admitting: Cardiology

## 2015-04-04 DIAGNOSIS — E119 Type 2 diabetes mellitus without complications: Secondary | ICD-10-CM | POA: Diagnosis not present

## 2015-04-04 DIAGNOSIS — E7800 Pure hypercholesterolemia, unspecified: Secondary | ICD-10-CM | POA: Diagnosis not present

## 2015-04-04 DIAGNOSIS — I10 Essential (primary) hypertension: Secondary | ICD-10-CM | POA: Diagnosis not present

## 2015-04-04 DIAGNOSIS — J449 Chronic obstructive pulmonary disease, unspecified: Secondary | ICD-10-CM | POA: Diagnosis not present

## 2015-04-24 DIAGNOSIS — I2510 Atherosclerotic heart disease of native coronary artery without angina pectoris: Secondary | ICD-10-CM | POA: Diagnosis not present

## 2015-04-24 DIAGNOSIS — E119 Type 2 diabetes mellitus without complications: Secondary | ICD-10-CM | POA: Diagnosis not present

## 2015-04-24 DIAGNOSIS — R0609 Other forms of dyspnea: Secondary | ICD-10-CM | POA: Diagnosis not present

## 2015-04-24 DIAGNOSIS — E7800 Pure hypercholesterolemia, unspecified: Secondary | ICD-10-CM | POA: Diagnosis not present

## 2015-05-01 DIAGNOSIS — Z1211 Encounter for screening for malignant neoplasm of colon: Secondary | ICD-10-CM | POA: Diagnosis not present

## 2015-05-03 ENCOUNTER — Ambulatory Visit: Payer: Medicare Other | Admitting: Pulmonary Disease

## 2015-05-03 ENCOUNTER — Ambulatory Visit (INDEPENDENT_AMBULATORY_CARE_PROVIDER_SITE_OTHER): Payer: Medicare HMO | Admitting: Pulmonary Disease

## 2015-05-03 ENCOUNTER — Ambulatory Visit (INDEPENDENT_AMBULATORY_CARE_PROVIDER_SITE_OTHER)
Admission: RE | Admit: 2015-05-03 | Discharge: 2015-05-03 | Disposition: A | Discharge status: Home / Self Care | Payer: Medicare HMO | Source: Ambulatory Visit | Attending: Pulmonary Disease | Admitting: Pulmonary Disease

## 2015-05-03 VITALS — BP 150/84 | HR 65 | Temp 98.1°F | Wt 286.6 lb

## 2015-05-03 DIAGNOSIS — J449 Chronic obstructive pulmonary disease, unspecified: Secondary | ICD-10-CM | POA: Insufficient documentation

## 2015-05-03 DIAGNOSIS — J438 Other emphysema: Secondary | ICD-10-CM | POA: Diagnosis not present

## 2015-05-03 DIAGNOSIS — I509 Heart failure, unspecified: Secondary | ICD-10-CM | POA: Diagnosis not present

## 2015-05-03 MED ORDER — TIOTROPIUM BROMIDE MONOHYDRATE 18 MCG IN CAPS: 18.0000 ug | ORAL_CAPSULE | Freq: Every day | RESPIRATORY_TRACT | Status: DC

## 2015-05-03 MED ORDER — BUDESONIDE-FORMOTEROL FUMARATE 160-4.5 MCG/ACT IN AERO: 2.0000 | INHALATION_SPRAY | Freq: Two times a day (BID) | RESPIRATORY_TRACT | Status: DC

## 2015-05-03 NOTE — Patient Instructions (Signed)
MrTyler-- it was nice meeting you today...  Today we did a follow up Spirometry breathing test and a CXR...    We will contact you w/ the results when available...   We decided to place you back on your 2 inhalers designed to help stabilize your breathing 7 prevent deterioration over time...    In this regard-- start back on the SYMBICORT160- 2 sprays twice daily...    And the [facility]- inhale the contents of one capsule via the handihaler device once daily...  Try to avoid infections (get all indicated adult vaccines etc)...  Stay as active as possible & work on weight reduction...  Call for any questions...  Let's plan a follow up visit in 33mo, sooner if needed for problems.Marland KitchenMarland Kitchen

## 2015-05-04 DIAGNOSIS — I2510 Atherosclerotic heart disease of native coronary artery without angina pectoris: Secondary | ICD-10-CM | POA: Diagnosis not present

## 2015-05-04 DIAGNOSIS — E119 Type 2 diabetes mellitus without complications: Secondary | ICD-10-CM | POA: Diagnosis not present

## 2015-05-04 DIAGNOSIS — E7800 Pure hypercholesterolemia, unspecified: Secondary | ICD-10-CM | POA: Diagnosis not present

## 2015-05-09 DIAGNOSIS — M25572 Pain in left ankle and joints of left foot: Secondary | ICD-10-CM | POA: Diagnosis not present

## 2015-05-09 DIAGNOSIS — Z6838 Body mass index (BMI) 38.0-38.9, adult: Secondary | ICD-10-CM | POA: Diagnosis not present

## 2015-05-09 DIAGNOSIS — I10 Essential (primary) hypertension: Secondary | ICD-10-CM | POA: Diagnosis not present

## 2015-05-09 DIAGNOSIS — N400 Enlarged prostate without lower urinary tract symptoms: Secondary | ICD-10-CM | POA: Diagnosis not present

## 2015-05-09 DIAGNOSIS — E784 Other hyperlipidemia: Secondary | ICD-10-CM | POA: Diagnosis not present

## 2015-05-09 DIAGNOSIS — E119 Type 2 diabetes mellitus without complications: Secondary | ICD-10-CM | POA: Diagnosis not present

## 2015-05-09 DIAGNOSIS — I25118 Atherosclerotic heart disease of native coronary artery with other forms of angina pectoris: Secondary | ICD-10-CM | POA: Diagnosis not present

## 2015-05-09 DIAGNOSIS — J449 Chronic obstructive pulmonary disease, unspecified: Secondary | ICD-10-CM | POA: Diagnosis not present

## 2015-05-15 DIAGNOSIS — I2510 Atherosclerotic heart disease of native coronary artery without angina pectoris: Secondary | ICD-10-CM | POA: Diagnosis not present

## 2015-05-15 DIAGNOSIS — E119 Type 2 diabetes mellitus without complications: Secondary | ICD-10-CM | POA: Diagnosis not present

## 2015-05-18 ENCOUNTER — Encounter: Payer: Self-pay | Admitting: Pulmonary Disease

## 2015-05-18 NOTE — Progress Notes (Signed)
Subjective:     Patient ID: Gregory [name], male   DOB: [birth year], 69 y.o.   MRN: [id number]  HPI 69 y/o gentleman, pt of DrClance, followed for COPD & here for 1 year ROV>     CXR 09/23/12 showed norm heart size, clear lungs x bibasilar atx, DJD Tspine, obesity...   Spirometry 09/23/12 showed FVC=2.29 (52%), FEV1=1.43 (43%), %1sec=63, mid-flows reduced at 20% predicted... c/w mod airflow obstruction & GOLD Stage 3 COPD.  V/Q Lung Scan 09/24/12 showed normal ventilation & normal perfusion- neg scan...   ~  04/26/13:  28mo ROV w/ KC>        The patient comes in today for followup of his known moderate COPD, and dyspnea on exertion. At the last visit, I had asked him to stop his maintenance bronchodilators because of no improvement in his symptoms and no acute exacerbations. He unfortunately did not do this, but continues to believe that the medications have not helped his breathing. He is also being followed closely by cardiology and feels there is not a major issue. He feels that his breathing is stable from the last visit.      REC>  The patient has seen no improvement in his breathing with a maintenance bronchodilator regimen, and therefore again I have asked him to discontinue. He is to stay on an as needed albuterol for emergencies.  The patient feels that his dyspnea on exertion is stable at this time. It is out of proportion to his degree of lung disease, and he has been cleared by cardiology as well. I really think an aggressive weight loss and a conditioning program are his best treatment options.  ~  05/04/14:  85yr ROV w/ KC>        The patient comes in today for follow-up of his known COPD. He has been tried on multiple bronchodilator regimen with no change in his breathing. He is therefore maintained on albuterol alone, but does not have an active rescue inhaler currently. He feels that his breathing is stable from last year, but continues to have chronic dyspnea on exertion. He denies any  cough, congestion, or mucus production. He has gained 7 pounds since the last visit.      REC>  The patient continues to have chronic dyspnea on exertion, but it is unchanged from last year. He does have some airflow limitation, but has never responded favorably to an aggressive bronchodilator regimen. I have asked him to concentrate on weight loss and a conditioning program, but we also need to make sure that he has a rescue inhaler available (dyspnea primarily related to obesity & deconditioning)...  ~  May 03, 2015:  44yr ROV w/ SN>        Gregory [name] is 69 y/o, exsmoker w/ hx mod airflow obstruction and GOLD Stage 3 COPD, prev on Symbicort & Spiriva but these were stopped because "I couldn't tell any difference"; he uses an AlbutHFA rescue inhaler as needed;  He is an ex-smoker having started in his teens, smoked for 40 yrs up to 1ppd max, and quit in 2005 at the age of 3 (It is noted that a cards note from [facility] in 2011 stated that he was STILL SMOKING);  He states that his breathing is doing OK- notes SOB/DOE w/ activities (eg- he prev mowed but stopped 43yrs ago due to SOB & "I'm older"); he has min cough/ sput, denies hemoptysis, no CP; he is way too sedentary & not exercising and we discussed this.Marland KitchenMarland Kitchen  His cardiologist is DrGanji> we do not have any notes from him in [facility] but pt was prev followed by [facility] & notes indicate HBP; CAD w/ occluded RCA w/ collaterals, prev stent in Circ, and 70% resid stenosis in D2 off the LAD=> this was dilated & stented 9/13 by DrGanji; HL; Obesity;  He lists- ASA, Amlodipine, Coreg, Tribenzor, Diovan, & Lipitor but he did not bring med bottles or current list to the office today & I suspect that DrGanji has made several adjustments...      His PCP was NP at [facility] (DrSanders) but he tells me that [facility] has just referred him to a new PCP- DrHolwerda & appt is pending>  Medical problems include:  HBP, HL, DM, Obesity, BPH, DJD/Gout;  In addition to  the meds above he lists Lipitor, Metformin, Flomax...   EXAM shows Afeb, VSS (BP=150/84), O2sat=95% on RA at rest;  HEENT- neg, mallampati2;  Chest-clear x few scat rhonchi, no w/r;  Heart- RR w/o m/r/g;  Abd- obese soft neg;  Ext- VI 1+edema  CXR 05/03/15 showed norm hear size, essentially clear lungs w/ mild basilar scarring/ NAD, DJD Tspine obesity...  Spirometry 05/03/15 showed FVC=2.29 (51%), FEV1=1.46 (43%), %1sec=64, mid-flows reduced at 21% predicted=> No change from 2014, c/w mod airflow obstruction & GOLD Stage 3 COPD.  IMP/PLAN>>  The pt has persistent airflow obstruction and GOLD Stage3 COPD; he has not had freq exacerbations and this is good but I believe he will benefit in the long term by being on a combination regimen for his COPD- in this regard I rec that he restart the SYMBICORT160- 2puffs Bid and the SPIRIVA once daily... We reviewed diet & exercise recommendations... We will plan a recheck in 6 months.    Past Medical History  Diagnosis Date  . Hypertension   . Hyperlipidemia   . Heart attack ([place])   . Diabetes mellitus   . SOB (shortness of breath) on exertion     Past Surgical History  Procedure Laterality Date  . Back surgery    . Knee arthroplasty    . Coronary stent placement    . Left heart catheterization with coronary angiogram N/A 03/03/2012    Procedure: LEFT HEART CATHETERIZATION WITH CORONARY ANGIOGRAM;  Surgeon: Laverda Page, MD;  Location: [facility] CATH LAB;  Service: Cardiovascular;  Laterality: N/A;  . Percutaneous coronary stent intervention (pci-s)  03/03/2012    Procedure: PERCUTANEOUS CORONARY STENT INTERVENTION (PCI-S);  Surgeon: Laverda Page, MD;  Location: [facility] CATH LAB;  Service: Cardiovascular;;    Outpatient Encounter Prescriptions as of 05/03/2015  Medication Sig  . Albuterol Sulfate (PROAIR RESPICLICK) 123XX123 (90 BASE) MCG/ACT AEPB Inhale 2 sprays into the lungs every 6 (six) hours as needed.  Marland Kitchen amLODipine (NORVASC) 5 MG tablet Take 2.5 mg  by mouth daily.  Marland Kitchen aspirin 81 MG tablet Take 243 mg by mouth daily.   Marland Kitchen atorvastatin (LIPITOR) 10 MG tablet Take 20 mg by mouth daily.   . carvedilol (COREG) 25 MG tablet Take 25 mg by mouth daily.   . metFORMIN (GLUCOPHAGE) 500 MG tablet Take 500 mg by mouth daily.  . Olmesartan-Amlodipine-HCTZ (TRIBENZOR) 40-5-25 MG TABS Take by mouth daily.  . Tamsulosin HCl (FLOMAX) 0.4 MG CAPS Take 0.4 mg by mouth daily.  . valsartan-hydrochlorothiazide (DIOVAN-HCT) 160-25 MG tablet     No Known Allergies   Current Medications, Allergies, Past Medical History, Past Surgical History, Family History, and Social History were reviewed in [employer]  record.   Review of Systems          All symptoms NEG except where BOLDED >>  Constitutional:  F/C/S, fatigue, anorexia, unexpected weight change. HEENT:  HA, visual changes, hearing loss, earache, nasal symptoms, sore throat, mouth sores, hoarseness. Resp:  cough, sputum, hemoptysis; SOB, tightness, wheezing. Cardio:  CP, palpit, DOE, orthopnea, edema. GI:  N/V/D/C, blood in stool; reflux, abd pain, distention, gas. GU:  dysuria, freq, urgency, hematuria, flank pain, voiding difficulty. MS:  joint pain, swelling, tenderness, decr ROM; neck pain, back pain, etc. Neuro:  HA, tremors, seizures, dizziness, syncope, weakness, numbness, gait abn. Skin:  suspicious lesions or skin rash. Heme:  adenopathy, bruising, bleeding. Psyche:  confusion, agitation, sleep disturbance, hallucinations, anxiety, depression suicidal.   Objective:   Physical Exam       Vital Signs:  Reviewed...  General:  WD, overweight, 69 y/o BM in NAD; alert & oriented; pleasant & cooperative... HEENT:  [place]/AT; Conjunctiva- pink, Sclera- nonicteric, EOM-wnl, PERRLA, EACs-clear, TMs-wnl; NOSE-clear; THROAT-clear & wnl. Neck:  Supple w/ fair ROM; no JVD; normal carotid impulses w/o bruits; no thyromegaly or nodules palpated; no lymphadenopathy. Chest:  Clear to P  & A x few scat rhonchi; without wheezes, rales, or signs of consolidation... Heart:  Regular Rhythm; norm S1 & S2 without murmurs, rubs, or gallops detected. Abdomen:  Obese, soft & nontender- no guarding or rebound; normal bowel sounds; no organomegaly or masses palpated. Ext:  decrROM; without deformities +arthritic changes; no varicose veins, +venous insuffic, 1+edema;  Pulses intact w/o bruits. Neuro:  No focal neuro deficits; sensory testing normal; gait normal & balance OK. Derm:  No lesions noted; no rash etc. Lymph:  No cervical, supraclavicular, axillary, or inguinal adenopathy palpated.   Assessment:     IMP >>       COPD> mod airflow obstruction on Spirometry w/ GOLD Stage 3 COPD; we have rec the institution of Symbicort160-2spBid & Spiriva daily...      Ex-smoker> he indicated to me that he quit in 2005 but Benay Spice indicated in a Cards note 02/2012 that he was still smoking!      CAD> known RCA occlusion w/ collaterals, prev Circ stent, and more recent D2 stent per [facility] & followed by him...      Medical problems include>  HBP, HL, DM, Obesity, BPH, DJD and he is set up to see DrHolwerda soon...  PLAN >>       The pt has persistent airflow obstruction and GOLD Stage3 COPD; he has not had freq exacerbations and this is good but I believe he will benefit in the long term by being on a combination regimen for his COPD- in this regard I rec that he restart the SYMBICORT160- 2puffs Bid and the SPIRIVA once daily... We reviewed diet & exercise recommendations... We will plan a recheck in 6 months.     Plan:     Patient's Medications  New Prescriptions   BUDESONIDE-FORMOTEROL (SYMBICORT) 160-4.5 MCG/ACT INHALER    Inhale 2 puffs into the lungs 2 (two) times daily.   TIOTROPIUM (SPIRIVA HANDIHALER) 18 MCG INHALATION CAPSULE    Place 1 capsule (18 mcg total) into inhaler and inhale daily.  Previous Medications   ALBUTEROL SULFATE (PROAIR RESPICLICK) 123XX123 (90 BASE) MCG/ACT AEPB    Inhale  2 sprays into the lungs every 6 (six) hours as needed.   AMLODIPINE (NORVASC) 5 MG TABLET    Take 2.5 mg by mouth daily.   ASPIRIN 81 MG TABLET  Take 243 mg by mouth daily.    ATORVASTATIN (LIPITOR) 10 MG TABLET    Take 20 mg by mouth daily.    CARVEDILOL (COREG) 25 MG TABLET    Take 25 mg by mouth daily.    METFORMIN (GLUCOPHAGE) 500 MG TABLET    Take 500 mg by mouth daily.   OLMESARTAN-AMLODIPINE-HCTZ (TRIBENZOR) 40-5-25 MG TABS    Take by mouth daily.   TAMSULOSIN HCL (FLOMAX) 0.4 MG CAPS    Take 0.4 mg by mouth daily.   VALSARTAN-HYDROCHLOROTHIAZIDE (DIOVAN-HCT) 160-25 MG TABLET      Modified Medications   No medications on file  Discontinued Medications   No medications on file

## 2015-07-05 DIAGNOSIS — N183 Chronic kidney disease, stage 3 (moderate): Secondary | ICD-10-CM | POA: Diagnosis not present

## 2015-07-05 DIAGNOSIS — R0989 Other specified symptoms and signs involving the circulatory and respiratory systems: Secondary | ICD-10-CM | POA: Diagnosis not present

## 2015-07-05 DIAGNOSIS — I10 Essential (primary) hypertension: Secondary | ICD-10-CM | POA: Diagnosis not present

## 2015-07-05 DIAGNOSIS — R0609 Other forms of dyspnea: Secondary | ICD-10-CM | POA: Diagnosis not present

## 2015-07-24 DIAGNOSIS — Z0000 Encounter for general adult medical examination without abnormal findings: Secondary | ICD-10-CM | POA: Diagnosis not present

## 2015-07-24 DIAGNOSIS — I2510 Atherosclerotic heart disease of native coronary artery without angina pectoris: Secondary | ICD-10-CM | POA: Diagnosis not present

## 2015-07-24 DIAGNOSIS — E119 Type 2 diabetes mellitus without complications: Secondary | ICD-10-CM | POA: Diagnosis not present

## 2015-07-31 DIAGNOSIS — E119 Type 2 diabetes mellitus without complications: Secondary | ICD-10-CM | POA: Diagnosis not present

## 2015-07-31 DIAGNOSIS — E870 Hyperosmolality and hypernatremia: Secondary | ICD-10-CM | POA: Diagnosis not present

## 2015-07-31 DIAGNOSIS — I25118 Atherosclerotic heart disease of native coronary artery with other forms of angina pectoris: Secondary | ICD-10-CM | POA: Diagnosis not present

## 2015-07-31 DIAGNOSIS — I10 Essential (primary) hypertension: Secondary | ICD-10-CM | POA: Diagnosis not present

## 2015-07-31 DIAGNOSIS — N400 Enlarged prostate without lower urinary tract symptoms: Secondary | ICD-10-CM | POA: Diagnosis not present

## 2015-07-31 DIAGNOSIS — E784 Other hyperlipidemia: Secondary | ICD-10-CM | POA: Diagnosis not present

## 2015-07-31 DIAGNOSIS — J449 Chronic obstructive pulmonary disease, unspecified: Secondary | ICD-10-CM | POA: Diagnosis not present

## 2015-07-31 DIAGNOSIS — Z23 Encounter for immunization: Secondary | ICD-10-CM | POA: Diagnosis not present

## 2015-07-31 DIAGNOSIS — Z6839 Body mass index (BMI) 39.0-39.9, adult: Secondary | ICD-10-CM | POA: Diagnosis not present

## 2015-07-31 DIAGNOSIS — Z0000 Encounter for general adult medical examination without abnormal findings: Secondary | ICD-10-CM | POA: Diagnosis not present

## 2015-08-17 DIAGNOSIS — R0989 Other specified symptoms and signs involving the circulatory and respiratory systems: Secondary | ICD-10-CM | POA: Diagnosis not present

## 2015-08-17 DIAGNOSIS — I10 Essential (primary) hypertension: Secondary | ICD-10-CM | POA: Diagnosis not present

## 2015-08-17 DIAGNOSIS — R0609 Other forms of dyspnea: Secondary | ICD-10-CM | POA: Diagnosis not present

## 2015-08-17 DIAGNOSIS — N183 Chronic kidney disease, stage 3 (moderate): Secondary | ICD-10-CM | POA: Diagnosis not present

## 2015-10-31 ENCOUNTER — Encounter: Payer: Self-pay | Admitting: Pulmonary Disease

## 2015-10-31 ENCOUNTER — Ambulatory Visit (INDEPENDENT_AMBULATORY_CARE_PROVIDER_SITE_OTHER): Payer: Medicare HMO | Admitting: Pulmonary Disease

## 2015-10-31 VITALS — BP 142/86 | HR 55 | Temp 96.9°F | Ht 75.0 in | Wt 297.4 lb

## 2015-10-31 DIAGNOSIS — J438 Other emphysema: Secondary | ICD-10-CM | POA: Diagnosis not present

## 2015-10-31 DIAGNOSIS — J449 Chronic obstructive pulmonary disease, unspecified: Secondary | ICD-10-CM

## 2015-10-31 DIAGNOSIS — R0600 Dyspnea, unspecified: Secondary | ICD-10-CM

## 2015-10-31 DIAGNOSIS — R0609 Other forms of dyspnea: Secondary | ICD-10-CM | POA: Diagnosis not present

## 2015-10-31 DIAGNOSIS — E663 Overweight: Secondary | ICD-10-CM | POA: Diagnosis not present

## 2015-10-31 MED ORDER — IPRATROPIUM-ALBUTEROL 0.5-2.5 (3) MG/3ML IN SOLN: 3.0000 mL | Freq: Two times a day (BID) | RESPIRATORY_TRACT | Status: DC

## 2015-10-31 MED ORDER — BUDESONIDE 0.25 MG/2ML IN SUSP: 0.2500 mg | Freq: Two times a day (BID) | RESPIRATORY_TRACT | Status: DC

## 2015-10-31 NOTE — Progress Notes (Signed)
Subjective:     Patient ID: Gregory [name], male   DOB: [birth year], 70 y.o.   MRN: [id number]  HPI  70 y/o gentleman, pt of DrClance, followed for COPD & here for follow up visit>  His PCP is DrHolwerda, and his CARDS is DrGanji>    CXR 09/23/12 showed norm heart size, clear lungs x bibasilar atx, DJD Tspine, obesity...   Spirometry 09/23/12 showed FVC=2.29 (52%), FEV1=1.43 (43%), %1sec=63, mid-flows reduced at 20% predicted... c/w mod airflow obstruction & GOLD Stage 3 COPD.  V/Q Lung Scan 09/24/12 showed normal ventilation & normal perfusion- neg scan...   ~  04/26/13:  87mo ROV w/ KC>        The patient comes in today for followup of his known moderate COPD, and dyspnea on exertion. At the last visit, I had asked him to stop his maintenance bronchodilators because of no improvement in his symptoms and no acute exacerbations. He unfortunately did not do this, but continues to believe that the medications have not helped his breathing. He is also being followed closely by cardiology and feels there is not a major issue. He feels that his breathing is stable from the last visit.      REC>  The patient has seen no improvement in his breathing with a maintenance bronchodilator regimen, and therefore again I have asked him to discontinue. He is to stay on an as needed albuterol for emergencies.  The patient feels that his dyspnea on exertion is stable at this time. It is out of proportion to his degree of lung disease, and he has been cleared by cardiology as well. I really think an aggressive weight loss and a conditioning program are his best treatment options.  ~  05/04/14:  70yr ROV w/ KC>        The patient comes in today for follow-up of his known COPD. He has been tried on multiple bronchodilator regimen with no change in his breathing. He is therefore maintained on albuterol alone, but does not have an active rescue inhaler currently. He feels that his breathing is stable from last year, but  continues to have chronic dyspnea on exertion. He denies any cough, congestion, or mucus production. He has gained 7 pounds since the last visit.      REC>  The patient continues to have chronic dyspnea on exertion, but it is unchanged from last year. He does have some airflow limitation, but has never responded favorably to an aggressive bronchodilator regimen. I have asked him to concentrate on weight loss and a conditioning program, but we also need to make sure that he has a rescue inhaler available (dyspnea primarily related to obesity & deconditioning)...  ~  May 03, 2015:  70yr ROV w/ SN>        Gregory [name] is 70 y/o, exsmoker w/ hx mod airflow obstruction and GOLD Stage 3 COPD, prev on Symbicort & Spiriva but these were stopped because "I couldn't tell any difference"; he uses an AlbutHFA rescue inhaler as needed;  He is an ex-smoker having started in his teens, smoked for 40 yrs up to 1ppd max, and quit in 2005 at the age of 70 (It is noted that a cards note from [facility] in 2011 stated that he was STILL SMOKING);  He states that his breathing is doing OK- notes SOB/DOE w/ activities (eg- he prev mowed but stopped 26yrs ago due to SOB & "I'm older"); he has min cough/ sput, denies hemoptysis, no CP; he is way  too sedentary & not exercising and we discussed this...       His cardiologist is DrGanji> we do not have any notes from him in EPIC but pt was prev followed by [facility] & notes indicate HBP; CAD w/ occluded RCA w/ collaterals, prev stent in Circ, and 70% resid stenosis in D2 off the LAD=> this was dilated & stented 9/13 by DrGanji; HL; Obesity;  He lists- ASA, Amlodipine, Coreg, Tribenzor, Diovan, & Lipitor but he did not bring med bottles or current list to the office today & I suspect that DrGanji has made several adjustments...      His PCP was NP at [facility] (DrSanders) but he tells me that [facility] has just referred him to a new PCP- DrHolwerda & appt is pending>  Medical problems  include:  HBP, HL, DM, Obesity, BPH, DJD/Gout;  In addition to the meds above he lists Lipitor, Metformin, Flomax...  EXAM shows Afeb, VSS (BP=150/84), O2sat=95% on RA at rest;  HEENT- neg, mallampati2;  Chest-clear x few scat rhonchi, no w/r;  Heart- RR w/o m/r/g;  Abd- obese soft neg;  Ext- VI 1+edema  CXR 05/03/15 showed norm hear size, essentially clear lungs w/ mild basilar scarring/ NAD, DJD Tspine obesity...  Spirometry 05/03/15 showed FVC=2.29 (51%), FEV1=1.46 (43%), %1sec=64, mid-flows reduced at 21% predicted=> No change from 2014, c/w mod airflow obstruction & GOLD Stage 3 COPD.  IMP/PLAN>>  The pt has persistent airflow obstruction and GOLD Stage3 COPD; he has not had freq exacerbations and this is good but I believe he will benefit in the long term by being on a combination regimen for his COPD- in this regard I rec that he restart the SYMBICORT160- 2puffs Bid and the SPIRIVA once daily... We reviewed diet & exercise recommendations... We will plan a recheck in 6 months.  ~  Oct 31, 2015:  70yo ROV & Gregory [name] has GOLD Stage3 COPD- notes cough w/ clear mucus, SOB w/ exertion- eg taking out the trash (no ch from prev), denies CP/ tightness/ wheezing/ etc;  "I'm doing pretty good for an old man"; I note that he has gained 10# & he says "I don't eat a whole lot but I eat at night";  He tells me that he is NOT using the Symbicort or the Spiriva because they are too expensive=> we discussed switch to NEBULIZER w/ DUONEB Bid & PULIMORT 0.25Bid... we reviewed the following medical problems during today's office visit >>     COPD, mixed type>  He has GOLD Stage 3 COPD w/ FEV1=1.46 (43%) and we rec Symbicort & Spiriva but he wouldn't fill the Rx due to cost- asked to find better PartD drug coverage & we will switch to NEBULIZER w/ Duoneb Bid & Pulmicort 0.25Bid...     Ex-cigarette smoker>     Cardiac Issues>  Followed by Konrad Saha-- HBP; HL, CAD w/ occluded RCA w/ collaterals, prev stent in Circ, and 70%  resid stenosis in D2 off the LAD=> this was dilated & stented 9/13 by DrGanji; we do not have notes in epic...    Medical issues>  Followed by Wilburt Finlay-- HBP, HL, DM, Obesity, BPH, DJD/Gout... EXAM shows Afeb, VSS (BP=150/84), O2sat=95% on RA at rest;  HEENT- neg, mallampati2;  Chest-clear x few scat rhonchi, no w/r;  Heart- RR w/o m/r/g;  Abd- obese soft neg;  Ext- VI 1+edema IMP/PLAN>>  I still feel it would be helpful for Gregory [name] to be on meds- try NEB w/ DUONEB Bid & Budes 0.25Bid  regularly;  He desperately needs wt reduction as well- we discussed DIET/ EXERCISE/ etc...    Past Medical History  Diagnosis Date  . Hypertension   . Hyperlipidemia   . Heart attack ([place])   . Diabetes mellitus   . SOB (shortness of breath) on exertion     Past Surgical History  Procedure Laterality Date  . Back surgery    . Knee arthroplasty    . Coronary stent placement    . Left heart catheterization with coronary angiogram N/A 03/03/2012    Procedure: LEFT HEART CATHETERIZATION WITH CORONARY ANGIOGRAM;  Surgeon: Laverda Page, MD;  Location: [facility] CATH LAB;  Service: Cardiovascular;  Laterality: N/A;  . Percutaneous coronary stent intervention (pci-s)  03/03/2012    Procedure: PERCUTANEOUS CORONARY STENT INTERVENTION (PCI-S);  Surgeon: Laverda Page, MD;  Location: [facility] CATH LAB;  Service: Cardiovascular;;    Outpatient Encounter Prescriptions as of 10/31/15  Medication Sig  . Albuterol Sulfate (PROAIR RESPICLICK) 123XX123 (90 BASE) MCG/ACT AEPB Inhale 2 sprays into the lungs every 6 (six) hours as needed.  Marland Kitchen amLODipine (NORVASC) 5 MG tablet Take 5 mg by mouth daily.  Marland Kitchen aspirin 81 MG tablet Take 162 mg by mouth daily.   Marland Kitchen atorvastatin (LIPITOR) 20 MG tablet Take 10 mg by mouth daily.   . carvedilol (COREG) 25 MG tablet Take 25 mg by mouth twice daily.   . metFORMIN (GLUCOPHAGE) 500 MG tablet Take 500 mg by mouth daily.  . Tamsulosin HCl (FLOMAX) 0.4 MG CAPS Take 0.4 mg by mouth daily.  .  valsartan-hydrochlorothiazide (DIOVAN-HCT) 160-25 MG tablet Take one tab by mouth daily    No Known Allergies   Current Medications, Allergies, Past Medical History, Past Surgical History, Family History, and Social History were reviewed in [employer] record.   Review of Systems          All symptoms NEG except where BOLDED >>  Constitutional:  F/C/S, fatigue, anorexia, unexpected weight change. HEENT:  HA, visual changes, hearing loss, earache, nasal symptoms, sore throat, mouth sores, hoarseness. Resp:  cough, sputum, hemoptysis; SOB, tightness, wheezing. Cardio:  CP, palpit, DOE, orthopnea, edema. GI:  N/V/D/C, blood in stool; reflux, abd pain, distention, gas. GU:  dysuria, freq, urgency, hematuria, flank pain, voiding difficulty. MS:  joint pain, swelling, tenderness, decr ROM; neck pain, back pain, etc. Neuro:  HA, tremors, seizures, dizziness, syncope, weakness, numbness, gait abn. Skin:  suspicious lesions or skin rash. Heme:  adenopathy, bruising, bleeding. Psyche:  confusion, agitation, sleep disturbance, hallucinations, anxiety, depression suicidal.   Objective:   Physical Exam       Vital Signs:  Reviewed...   General:  WD, overweight, 70 y/o BM in NAD; alert & oriented; pleasant & cooperative... HEENT:  [place]/AT; Conjunctiva- pink, Sclera- nonicteric, EOM-wnl, PERRLA, EACs-clear, TMs-wnl; NOSE-clear; THROAT-clear & wnl.  Neck:  Supple w/ fair ROM; no JVD; normal carotid impulses w/o bruits; no thyromegaly or nodules palpated; no lymphadenopathy.  Chest:  Clear to P & A x few scat rhonchi; without wheezes, rales, or signs of consolidation... Heart:  Regular Rhythm; norm S1 & S2 without murmurs, rubs, or gallops detected. Abdomen:  Obese, soft & nontender- no guarding or rebound; normal bowel sounds; no organomegaly or masses palpated. Ext:  decrROM; without deformities +arthritic changes; no varicose veins, +venous insuffic, 1+edema;  Pulses intact  w/o bruits. Neuro:  No focal neuro deficits; sensory testing normal; gait normal & balance OK. Derm:  No lesions noted; no rash etc. Lymph:  No cervical, supraclavicular, axillary, or inguinal adenopathy palpated.   Assessment:     IMP >>       COPD> mod airflow obstruction on Spirometry w/ GOLD Stage 3 COPD; we have rec the institution of Symbicort160-2spBid & Spiriva daily...      Ex-smoker> he indicated to me that he quit in 2005 but Benay Spice indicated in a Cards note 02/2012 that he was still smoking!      CAD> known RCA occlusion w/ collaterals, prev Circ stent, and more recent D2 stent per [facility] & followed by him...      Medical problems include>  HBP, HL, DM, Obesity, BPH, DJD and he is set up to see DrHolwerda soon...  PLAN >>  05/03/15>   The pt has persistent airflow obstruction and GOLD Stage3 COPD; he has not had freq exacerbations and this is good but I believe he will benefit in the long term by being on a combination regimen for his COPD- in this regard I rec that he restart the SYMBICORT160- 2puffs Bid and the SPIRIVA once daily... We reviewed diet & exercise recommendations... We will plan a recheck in 6 months. 10/31/15>    I still feel it would be helpful for Gregory [name] to be on meds- try NEB w/ DUONEB Bid & Budes 0.25Bid regularly;  He desperately needs wt reduction as well- we discussed DIET/ EXERCISE/ etc...     Plan:     Patient's Medications  New Prescriptions   BUDESONIDE (PULMICORT) 0.25 MG/2ML NEBULIZER SOLUTION    Take 2 mLs (0.25 mg total) by nebulization 2 (two) times daily. DX: J44.9.   IPRATROPIUM-ALBUTEROL (DUONEB) 0.5-2.5 (3) MG/3ML SOLN    Take 3 mLs by nebulization 2 (two) times daily. DX: J44.9  Previous Medications   ALBUTEROL SULFATE (PROAIR RESPICLICK) 123XX123 (90 BASE) MCG/ACT AEPB    Inhale 2 sprays into the lungs every 6 (six) hours as needed.   AMLODIPINE (NORVASC) 5 MG TABLET    Take 5 mg by mouth daily.   ASPIRIN 81 MG TABLET    Take 162 mg by mouth  daily.    ATORVASTATIN (LIPITOR) 20 MG TABLET    Take 10 mg by mouth daily.    CARVEDILOL (COREG) 25 MG TABLET    Take 25 mg by mouth twice daily.    HYDRALAZINE (APRESOLINE) 50 MG TABLET    Take 50 mg by mouth 3 (three) times daily.   METFORMIN (GLUCOPHAGE) 500 MG TABLET    Take 500 mg by mouth daily.   TAMSULOSIN HCL (FLOMAX) 0.4 MG CAPS    Take 0.4 mg by mouth daily.   VALSARTAN-HYDROCHLOROTHIAZIDE (DIOVAN-HCT) 160-25 MG TABLET    Take one tab daily  Modified Medications   No medications on file  Discontinued Medications   BUDESONIDE-FORMOTEROL (SYMBICORT) 160-4.5 MCG/ACT INHALER    Inhale 2 puffs into the lungs 2 (two) times daily.   TIOTROPIUM (SPIRIVA HANDIHALER) 18 MCG INHALATION CAPSULE    Place 1 capsule (18 mcg total) into inhaler and inhale daily.

## 2015-10-31 NOTE — Patient Instructions (Signed)
Today we updated your med list in our EPIC system...     Since the inhalers are too expensive (keep on looking for a Part D medicare plan w/ more affordable meds)    We will try you on a NEBULIZER since these meds are covered under Medicare Part B, which is often cheaper for the patient...  Let's try DUONEB in the Nebulizer twice daily, followed by BUDESONIDE 0.25mg  in the machine twice daily...  Remember to count the calories & eat less, exercise & burn more => work on weight reduction...  Call for any questions...  Let's plan a follow up visit in 20mo, sooner if needed for problems.Marland KitchenMarland Kitchen

## 2015-11-01 DIAGNOSIS — R69 Illness, unspecified: Secondary | ICD-10-CM | POA: Diagnosis not present

## 2015-11-01 DIAGNOSIS — J449 Chronic obstructive pulmonary disease, unspecified: Secondary | ICD-10-CM | POA: Diagnosis not present

## 2015-12-12 DIAGNOSIS — I25118 Atherosclerotic heart disease of native coronary artery with other forms of angina pectoris: Secondary | ICD-10-CM | POA: Diagnosis not present

## 2015-12-12 DIAGNOSIS — J449 Chronic obstructive pulmonary disease, unspecified: Secondary | ICD-10-CM | POA: Diagnosis not present

## 2015-12-12 DIAGNOSIS — I10 Essential (primary) hypertension: Secondary | ICD-10-CM | POA: Diagnosis not present

## 2015-12-12 DIAGNOSIS — E784 Other hyperlipidemia: Secondary | ICD-10-CM | POA: Diagnosis not present

## 2015-12-12 DIAGNOSIS — Z6839 Body mass index (BMI) 39.0-39.9, adult: Secondary | ICD-10-CM | POA: Diagnosis not present

## 2015-12-12 DIAGNOSIS — E119 Type 2 diabetes mellitus without complications: Secondary | ICD-10-CM | POA: Diagnosis not present

## 2016-02-22 DIAGNOSIS — I10 Essential (primary) hypertension: Secondary | ICD-10-CM | POA: Diagnosis not present

## 2016-02-22 DIAGNOSIS — I2510 Atherosclerotic heart disease of native coronary artery without angina pectoris: Secondary | ICD-10-CM | POA: Diagnosis not present

## 2016-02-22 DIAGNOSIS — R0609 Other forms of dyspnea: Secondary | ICD-10-CM | POA: Diagnosis not present

## 2016-02-22 DIAGNOSIS — R0989 Other specified symptoms and signs involving the circulatory and respiratory systems: Secondary | ICD-10-CM | POA: Diagnosis not present

## 2016-03-06 DIAGNOSIS — R0989 Other specified symptoms and signs involving the circulatory and respiratory systems: Secondary | ICD-10-CM | POA: Diagnosis not present

## 2016-04-02 DIAGNOSIS — I10 Essential (primary) hypertension: Secondary | ICD-10-CM | POA: Diagnosis not present

## 2016-04-02 DIAGNOSIS — E7800 Pure hypercholesterolemia, unspecified: Secondary | ICD-10-CM | POA: Diagnosis not present

## 2016-04-02 DIAGNOSIS — Z0000 Encounter for general adult medical examination without abnormal findings: Secondary | ICD-10-CM | POA: Diagnosis not present

## 2016-04-02 DIAGNOSIS — I2510 Atherosclerotic heart disease of native coronary artery without angina pectoris: Secondary | ICD-10-CM | POA: Diagnosis not present

## 2016-04-02 DIAGNOSIS — J438 Other emphysema: Secondary | ICD-10-CM | POA: Diagnosis not present

## 2016-04-02 DIAGNOSIS — E1165 Type 2 diabetes mellitus with hyperglycemia: Secondary | ICD-10-CM | POA: Diagnosis not present

## 2016-04-30 DIAGNOSIS — E119 Type 2 diabetes mellitus without complications: Secondary | ICD-10-CM | POA: Diagnosis not present

## 2016-04-30 DIAGNOSIS — G5600 Carpal tunnel syndrome, unspecified upper limb: Secondary | ICD-10-CM | POA: Diagnosis not present

## 2016-04-30 DIAGNOSIS — M5090 Cervical disc disorder, unspecified, unspecified cervical region: Secondary | ICD-10-CM | POA: Diagnosis not present

## 2016-04-30 DIAGNOSIS — I10 Essential (primary) hypertension: Secondary | ICD-10-CM | POA: Diagnosis not present

## 2016-04-30 DIAGNOSIS — E784 Other hyperlipidemia: Secondary | ICD-10-CM | POA: Diagnosis not present

## 2016-04-30 DIAGNOSIS — E1140 Type 2 diabetes mellitus with diabetic neuropathy, unspecified: Secondary | ICD-10-CM | POA: Diagnosis not present

## 2016-04-30 DIAGNOSIS — Z6839 Body mass index (BMI) 39.0-39.9, adult: Secondary | ICD-10-CM | POA: Diagnosis not present

## 2016-05-02 ENCOUNTER — Ambulatory Visit (INDEPENDENT_AMBULATORY_CARE_PROVIDER_SITE_OTHER): Payer: Medicare HMO | Admitting: Pulmonary Disease

## 2016-05-02 VITALS — BP 116/64 | HR 57 | Temp 97.0°F | Ht 75.0 in | Wt 276.5 lb

## 2016-05-02 DIAGNOSIS — E663 Overweight: Secondary | ICD-10-CM | POA: Diagnosis not present

## 2016-05-02 DIAGNOSIS — J449 Chronic obstructive pulmonary disease, unspecified: Secondary | ICD-10-CM

## 2016-05-02 DIAGNOSIS — R0609 Other forms of dyspnea: Secondary | ICD-10-CM

## 2016-05-02 DIAGNOSIS — R0600 Dyspnea, unspecified: Secondary | ICD-10-CM

## 2016-05-02 DIAGNOSIS — E7800 Pure hypercholesterolemia, unspecified: Secondary | ICD-10-CM

## 2016-05-02 DIAGNOSIS — I2510 Atherosclerotic heart disease of native coronary artery without angina pectoris: Secondary | ICD-10-CM | POA: Diagnosis not present

## 2016-05-02 DIAGNOSIS — I10 Essential (primary) hypertension: Secondary | ICD-10-CM

## 2016-05-02 NOTE — Patient Instructions (Signed)
Gregory [name]-- it was great seeing you again...  Keep up the good work with>>     1) DIET/ [employer] reduction.Marland KitchenMarland Kitchen     2) Using your NEBULIZER w/ the Duoneb & Busdesonide twice daily...  Call for any questions...  Let's plan a follow up visit in 9mo, sooner if needed for problems.Marland KitchenMarland Kitchen

## 2016-05-03 ENCOUNTER — Encounter: Payer: Self-pay | Admitting: Pulmonary Disease

## 2016-05-03 NOTE — Progress Notes (Signed)
Subjective:     Patient ID: Gregory [name], male   DOB: [birth year], 70 y.o.   MRN: [id number]  HPI 70 y/o gentleman, pt of DrClance, followed for COPD & here for follow up visit>  His PCP is DrHolwerda, and his CARDS is DrGanji>    CXR 09/23/12 showed norm heart size, clear lungs x bibasilar atx, DJD Tspine, obesity...   Spirometry 09/23/12 showed FVC=2.29 (52%), FEV1=1.43 (43%), %1sec=63, mid-flows reduced at 20% predicted... c/w mod airflow obstruction & GOLD Stage 3 COPD.  V/Q Lung Scan 09/24/12 showed normal ventilation & normal perfusion- neg scan...   ~  04/26/13:  38mo ROV w/ KC>        The patient comes in today for followup of his known moderate COPD, and dyspnea on exertion. At the last visit, I had asked him to stop his maintenance bronchodilators because of no improvement in his symptoms and no acute exacerbations. He unfortunately did not do this, but continues to believe that the medications have not helped his breathing. He is also being followed closely by cardiology and feels there is not a major issue. He feels that his breathing is stable from the last visit.      REC>  The patient has seen no improvement in his breathing with a maintenance bronchodilator regimen, and therefore again I have asked him to discontinue. He is to stay on an as needed albuterol for emergencies.  The patient feels that his dyspnea on exertion is stable at this time. It is out of proportion to his degree of lung disease, and he has been cleared by cardiology as well. I really think an aggressive weight loss and a conditioning program are his best treatment options.  ~  05/04/14:  51yr ROV w/ KC>        The patient comes in today for follow-up of his known COPD. He has been tried on multiple bronchodilator regimen with no change in his breathing. He is therefore maintained on albuterol alone, but does not have an active rescue inhaler currently. He feels that his breathing is stable from last year, but  continues to have chronic dyspnea on exertion. He denies any cough, congestion, or mucus production. He has gained 7 pounds since the last visit.      REC>  The patient continues to have chronic dyspnea on exertion, but it is unchanged from last year. He does have some airflow limitation, but has never responded favorably to an aggressive bronchodilator regimen. I have asked him to concentrate on weight loss and a conditioning program, but we also need to make sure that he has a rescue inhaler available (dyspnea primarily related to obesity & deconditioning)...  ~  May 03, 2015:  62yr ROV w/ SN>        Gregory [name] is 71 y/o, exsmoker w/ hx mod airflow obstruction and GOLD Stage 3 COPD, prev on Symbicort & Spiriva but these were stopped because "I couldn't tell any difference"; he uses an AlbutHFA rescue inhaler as needed;  He is an ex-smoker having started in his teens, smoked for 40 yrs up to 1ppd max, and quit in 2005 at the age of 1 (It is noted that a cards note from [facility] in 2011 stated that he was STILL SMOKING);  He states that his breathing is doing OK- notes SOB/DOE w/ activities (eg- he prev mowed but stopped 31yrs ago due to SOB & "I'm older"); he has min cough/ sput, denies hemoptysis, no CP; he is way too  sedentary & not exercising and we discussed this...       His cardiologist is DrGanji> we do not have any notes from him in EPIC but pt was prev followed by [facility] & notes indicate HBP; CAD w/ occluded RCA w/ collaterals, prev stent in Circ, and 70% resid stenosis in D2 off the LAD=> this was dilated & stented 9/13 by DrGanji; HL; Obesity;  He lists- ASA, Amlodipine, Coreg, Tribenzor, Diovan, & Lipitor but he did not bring med bottles or current list to the office today & I suspect that DrGanji has made several adjustments...      His PCP was NP at [facility] (DrSanders) but he tells me that [facility] has just referred him to a new PCP- DrHolwerda & appt is pending>  Medical problems  include:  HBP, HL, DM, Obesity, BPH, DJD/Gout;  In addition to the meds above he lists Lipitor, Metformin, Flomax...  EXAM shows Afeb, VSS (BP=150/84), O2sat=95% on RA at rest;  HEENT- neg, mallampati2;  Chest-clear x few scat rhonchi, no w/r;  Heart- RR w/o m/r/g;  Abd- obese soft neg;  Ext- VI 1+edema  CXR 05/03/15 showed norm hear size, essentially clear lungs w/ mild basilar scarring/ NAD, DJD Tspine obesity...  Spirometry 05/03/15 showed FVC=2.29 (51%), FEV1=1.46 (43%), %1sec=64, mid-flows reduced at 21% predicted=> No change from 2014, c/w mod airflow obstruction & GOLD Stage 3 COPD.  IMP/PLAN>>  The pt has persistent airflow obstruction and GOLD Stage3 COPD; he has not had freq exacerbations and this is good but I believe he will benefit in the long term by being on a combination regimen for his COPD- in this regard I rec that he restart the SYMBICORT160- 2puffs Bid and the SPIRIVA once daily... We reviewed diet & exercise recommendations... We will plan a recheck in 6 months.  ~  Oct 31, 2015:  35mo ROV & Gregory [name] has GOLD Stage3 COPD- notes cough w/ clear mucus, SOB w/ exertion- eg taking out the trash (no ch from prev), denies CP/ tightness/ wheezing/ etc;  "I'm doing pretty good for an old man"; I note that he has gained 10# & he says "I don't eat a whole lot but I eat at night";  He tells me that he is NOT using the Symbicort or the Spiriva because they are too expensive=> we discussed switch to NEBULIZER w/ DUONEB Bid & PULIMORT 0.25Bid... we reviewed the following medical problems during today's office visit >>     COPD, mixed type>  He has GOLD Stage 3 COPD w/ FEV1=1.46 (43%) and we rec Symbicort & Spiriva but he wouldn't fill the Rx due to cost- asked to find better PartD drug coverage & we will switch to NEBULIZER w/ Duoneb Bid & Pulmicort 0.25Bid...     Ex-cigarette smoker>     Cardiac Issues>  Followed by Konrad Saha-- HBP; HL, CAD w/ occluded RCA w/ collaterals, prev stent in Circ, and 70%  resid stenosis in D2 off the LAD=> this was dilated & stented 9/13 by DrGanji; we do not have notes in epic...    Medical issues>  Followed by Wilburt Finlay-- HBP, HL, DM, Obesity, BPH, DJD/Gout... EXAM shows Afeb, VSS (BP=150/84), O2sat=95% on RA at rest;  HEENT- neg, mallampati2;  Chest-clear x few scat rhonchi, no w/r;  Heart- RR w/o m/r/g;  Abd- obese soft neg;  Ext- VI 1+edema IMP/PLAN>>  I still feel it would be helpful for Gregory [name] to be on meds- try NEB w/ DUONEB Bid & Budes 0.25Bid regularly;  He desperately needs wt reduction as well- we discussed DIET/ EXERCISE/ etc...  ~  May 02, 2016:  51mo ROV w/ SN>  Gregory [name] has done a credible job w/ diet/ exercise & wt reduction=> down 20# to 277# today by taking up a cooking hobby & walking at [employer] w/ cart + exerc bike etc (due to back & knees);  He states "I feel great" and he says his grandson helps him w/ his Nebs Bid, he denies ch in breathing/ cough/ sput/ hemoptysis/ CP/ etc... His PCP is Interior and spatial designer...    COPD, mixed type>  He has GOLD Stage 3 COPD w/ FEV1=1.46 (43%) and we rec Symbicort & Spiriva but he wouldn't fill the Rx due to cost- asked to find better PartD drug coverage & we switched to NEBULIZER w/ Duoneb Bid & Pulmicort 0.25Bid=> stable on this regimen + diet/ exercise/ wt reduction...    Ex-cigarette smoker>     Cardiac Issues>  Followed by DrGanji-- HBP; HL, CAD w/ occluded RCA w/ collaterals, prev stent in Circ, and 70% resid stenosis in D2 off the LAD=> this was dilated & stented 9/13 by DrGanji; we do not have notes in epic; pt is reminded to request notes from [facility] for [place] to scan into Epic;  Med list includes ASA81, Coreg25Bid, Amlod5, Apres50Tid, DiovanHYCT160-25 daily...    Medical issues>  Followed by Wilburt Finlay-- HBP, HL, DM, Obesity, BPH, DJD/Gout; he is on the above meds + Lip10, Metform500/d, & Flomax0.4.Marland KitchenMarland Kitchen EXAM shows Afeb, VSS (BP=150/84), O2sat=95% on RA at rest;  HEENT- neg, mallampati2;  Chest-clear x few scat  rhonchi, no w/r;  Heart- RR w/o m/r/g;  Abd- obese soft neg;  Ext- VI 1+edema IMP/PLAN>>  [place] has done a great job w/ weight reduction- continue same meds and keep up the good work!    Past Medical History:  Diagnosis Date  . Diabetes mellitus   . Heart attack   . Hyperlipidemia   . Hypertension   . SOB (shortness of breath) on exertion     Past Surgical History:  Procedure Laterality Date  . BACK SURGERY    . CORONARY STENT PLACEMENT    . KNEE ARTHROPLASTY    . LEFT HEART CATHETERIZATION WITH CORONARY ANGIOGRAM N/A 03/03/2012   Procedure: LEFT HEART CATHETERIZATION WITH CORONARY ANGIOGRAM;  Surgeon: Laverda Page, MD;  Location: [facility] CATH LAB;  Service: Cardiovascular;  Laterality: N/A;  . PERCUTANEOUS CORONARY STENT INTERVENTION (PCI-S)  03/03/2012   Procedure: PERCUTANEOUS CORONARY STENT INTERVENTION (PCI-S);  Surgeon: Laverda Page, MD;  Location: [facility] CATH LAB;  Service: Cardiovascular;;    Outpatient Encounter Prescriptions as of 05/02/2016  Medication Sig  . amLODipine (NORVASC) 5 MG tablet Take 2.5 mg by mouth daily.  Marland Kitchen aspirin 81 MG tablet Take 81 mg by mouth daily.   Marland Kitchen atorvastatin (LIPITOR) 10 MG tablet Take 10 mg by mouth daily.   . budesonide (PULMICORT) 0.25 MG/2ML nebulizer solution Take 2 mLs (0.25 mg total) by nebulization 2 (two) times daily. DX: J44.9.  Marland Kitchen carvedilol (COREG) 25 MG tablet Take 50 mg by mouth daily.   . hydrALAZINE (APRESOLINE) 50 MG tablet Take 50 mg by mouth 3 (three) times daily.  Marland Kitchen ipratropium-albuterol (DUONEB) 0.5-2.5 (3) MG/3ML SOLN Take 3 mLs by nebulization 2 (two) times daily. DX: J44.9  . metFORMIN (GLUCOPHAGE) 500 MG tablet Take 500 mg by mouth daily.  . Tamsulosin HCl (FLOMAX) 0.4 MG CAPS Take 0.4 mg by mouth daily.  . valsartan-hydrochlorothiazide (DIOVAN-HCT) 160-25 MG tablet Take 1  tablet by mouth daily.   . [DISCONTINUED] Albuterol Sulfate (PROAIR RESPICLICK) 093 (90 BASE) MCG/ACT AEPB Inhale 2 sprays into the lungs every 6  (six) hours as needed. (Patient not taking: Reported on 05/02/2016)  . [DISCONTINUED] Olmesartan-Amlodipine-HCTZ (TRIBENZOR) 40-5-25 MG TABS Take by mouth daily.   No facility-administered encounter medications on file as of 05/02/2016.     No Known Allergies  Immunization History  Administered Date(s) Administered  . Pneumococcal Polysaccharide-23 07/26/2015  HE WILL CHECK w/ HIS PCP TO  BE SURE HE IS UP TO DATE...   Current Medications, Allergies, Past Medical History, Past Surgical History, Family History, and Social History were reviewed in [employer] record.   Review of Systems          All symptoms NEG except where BOLDED >>  Constitutional:  F/C/S, fatigue, anorexia, unexpected weight change. HEENT:  HA, visual changes, hearing loss, earache, nasal symptoms, sore throat, mouth sores, hoarseness. Resp:  cough, sputum, hemoptysis; SOB, tightness, wheezing. Cardio:  CP, palpit, DOE, orthopnea, edema. GI:  N/V/D/C, blood in stool; reflux, abd pain, distention, gas. GU:  dysuria, freq, urgency, hematuria, flank pain, voiding difficulty. MS:  joint pain, swelling, tenderness, decr ROM; neck pain, back pain, etc. Neuro:  HA, tremors, seizures, dizziness, syncope, weakness, numbness, gait abn. Skin:  suspicious lesions or skin rash. Heme:  adenopathy, bruising, bleeding. Psyche:  confusion, agitation, sleep disturbance, hallucinations, anxiety, depression suicidal.   Objective:   Physical Exam       Vital Signs:  Reviewed...   General:  WD, overweight, 70 y/o BM in NAD; alert & oriented; pleasant & cooperative... HEENT:  [place]/AT; Conjunctiva- pink, Sclera- nonicteric, EOM-wnl, PERRLA, EACs-clear, TMs-wnl; NOSE-clear; THROAT-clear & wnl.  Neck:  Supple w/ fair ROM; no JVD; normal carotid impulses w/o bruits; no thyromegaly or nodules palpated; no lymphadenopathy.  Chest:  Clear to P & A x few scat rhonchi; without wheezes, rales, or signs of  consolidation... Heart:  Regular Rhythm; norm S1 & S2 without murmurs, rubs, or gallops detected. Abdomen:  Obese, soft & nontender- no guarding or rebound; normal bowel sounds; no organomegaly or masses palpated. Ext:  decrROM; without deformities +arthritic changes; no varicose veins, +venous insuffic, 1+edema;  Pulses intact w/o bruits. Neuro:  No focal neuro deficits; sensory testing normal; gait normal & balance OK. Derm:  No lesions noted; no rash etc. Lymph:  No cervical, supraclavicular, axillary, or inguinal adenopathy palpated.   Assessment:     IMP >>       COPD> mod airflow obstruction on Spirometry w/ GOLD Stage 3 COPD; we have rec the institution of Symbicort160-2spBid & Spiriva daily...      Ex-smoker> he indicated to me that he quit in 2005 but Benay Spice indicated in a Cards note 02/2012 that he was still smoking!      CAD> known RCA occlusion w/ collaterals, prev Circ stent, and more recent D2 stent per [facility] & followed by him...      Medical problems include>  HBP, HL, DM, Obesity, BPH, DJD and he is set up to see DrHolwerda soon...  PLAN >>  05/03/15>   The pt has persistent airflow obstruction and GOLD Stage3 COPD; he has not had freq exacerbations and this is good but I believe he will benefit in the long term by being on a combination regimen for his COPD- in this regard I rec that he restart the SYMBICORT160- 2puffs Bid and the SPIRIVA once daily... We reviewed diet & exercise recommendations... We  will plan a recheck in 6 months. 10/31/15>    I still feel it would be helpful for Gregory [name] to be on meds- try NEB w/ DUONEB Bid & Budes 0.25Bid regularly;  He desperately needs wt reduction as well- we discussed DIET/ EXERCISE/ etc... 05/02/16>   Stable, and IMPROVED w/ 20# wt loss on diet & exerc program- continue same...     Plan:     Patient's Medications  New Prescriptions   No medications on file  Previous Medications   AMLODIPINE (NORVASC) 5 MG TABLET    Take 2.5 mg by  mouth daily.   ASPIRIN 81 MG TABLET    Take 81 mg by mouth daily.    ATORVASTATIN (LIPITOR) 10 MG TABLET    Take 10 mg by mouth daily.    BUDESONIDE (PULMICORT) 0.25 MG/2ML NEBULIZER SOLUTION    Take 2 mLs (0.25 mg total) by nebulization 2 (two) times daily. DX: J44.9.   CARVEDILOL (COREG) 25 MG TABLET    Take 50 mg by mouth daily.    HYDRALAZINE (APRESOLINE) 50 MG TABLET    Take 50 mg by mouth 3 (three) times daily.   IPRATROPIUM-ALBUTEROL (DUONEB) 0.5-2.5 (3) MG/3ML SOLN    Take 3 mLs by nebulization 2 (two) times daily. DX: J44.9   METFORMIN (GLUCOPHAGE) 500 MG TABLET    Take 500 mg by mouth daily.   TAMSULOSIN HCL (FLOMAX) 0.4 MG CAPS    Take 0.4 mg by mouth daily.   VALSARTAN-HYDROCHLOROTHIAZIDE (DIOVAN-HCT) 160-25 MG TABLET    Take 1 tablet by mouth daily.   Modified Medications   No medications on file  Discontinued Medications   ALBUTEROL SULFATE (PROAIR RESPICLICK) 376 (90 BASE) MCG/ACT AEPB    Inhale 2 sprays into the lungs every 6 (six) hours as needed.   OLMESARTAN-AMLODIPINE-HCTZ (TRIBENZOR) 40-5-25 MG TABS    Take by mouth daily.

## 2016-05-28 DIAGNOSIS — M539 Dorsopathy, unspecified: Secondary | ICD-10-CM | POA: Diagnosis not present

## 2016-05-28 DIAGNOSIS — M1711 Unilateral primary osteoarthritis, right knee: Secondary | ICD-10-CM | POA: Diagnosis not present

## 2016-07-05 DIAGNOSIS — M545 Low back pain: Secondary | ICD-10-CM | POA: Diagnosis not present

## 2016-07-26 DIAGNOSIS — Z125 Encounter for screening for malignant neoplasm of prostate: Secondary | ICD-10-CM | POA: Diagnosis not present

## 2016-07-26 DIAGNOSIS — E784 Other hyperlipidemia: Secondary | ICD-10-CM | POA: Diagnosis not present

## 2016-07-26 DIAGNOSIS — E1140 Type 2 diabetes mellitus with diabetic neuropathy, unspecified: Secondary | ICD-10-CM | POA: Diagnosis not present

## 2016-07-26 DIAGNOSIS — N390 Urinary tract infection, site not specified: Secondary | ICD-10-CM | POA: Diagnosis not present

## 2016-08-02 DIAGNOSIS — I10 Essential (primary) hypertension: Secondary | ICD-10-CM | POA: Diagnosis not present

## 2016-08-02 DIAGNOSIS — N400 Enlarged prostate without lower urinary tract symptoms: Secondary | ICD-10-CM | POA: Diagnosis not present

## 2016-08-02 DIAGNOSIS — I25118 Atherosclerotic heart disease of native coronary artery with other forms of angina pectoris: Secondary | ICD-10-CM | POA: Diagnosis not present

## 2016-08-02 DIAGNOSIS — M25551 Pain in right hip: Secondary | ICD-10-CM | POA: Diagnosis not present

## 2016-08-02 DIAGNOSIS — Z0000 Encounter for general adult medical examination without abnormal findings: Secondary | ICD-10-CM | POA: Diagnosis not present

## 2016-08-02 DIAGNOSIS — E784 Other hyperlipidemia: Secondary | ICD-10-CM | POA: Diagnosis not present

## 2016-08-02 DIAGNOSIS — Z6838 Body mass index (BMI) 38.0-38.9, adult: Secondary | ICD-10-CM | POA: Diagnosis not present

## 2016-08-02 DIAGNOSIS — E1140 Type 2 diabetes mellitus with diabetic neuropathy, unspecified: Secondary | ICD-10-CM | POA: Diagnosis not present

## 2016-08-02 DIAGNOSIS — J449 Chronic obstructive pulmonary disease, unspecified: Secondary | ICD-10-CM | POA: Diagnosis not present

## 2016-08-02 DIAGNOSIS — H6120 Impacted cerumen, unspecified ear: Secondary | ICD-10-CM | POA: Diagnosis not present

## 2016-08-05 ENCOUNTER — Encounter: Payer: Self-pay | Admitting: Internal Medicine

## 2016-08-07 DIAGNOSIS — Z1212 Encounter for screening for malignant neoplasm of rectum: Secondary | ICD-10-CM | POA: Diagnosis not present

## 2016-08-21 ENCOUNTER — Other Ambulatory Visit (INDEPENDENT_AMBULATORY_CARE_PROVIDER_SITE_OTHER): Payer: Self-pay

## 2016-08-21 ENCOUNTER — Ambulatory Visit (INDEPENDENT_AMBULATORY_CARE_PROVIDER_SITE_OTHER): Payer: Medicare HMO

## 2016-08-21 ENCOUNTER — Ambulatory Visit (INDEPENDENT_AMBULATORY_CARE_PROVIDER_SITE_OTHER): Payer: Medicare HMO | Admitting: Orthopaedic Surgery

## 2016-08-21 DIAGNOSIS — M1612 Unilateral primary osteoarthritis, left hip: Secondary | ICD-10-CM | POA: Diagnosis not present

## 2016-08-21 DIAGNOSIS — M25551 Pain in right hip: Secondary | ICD-10-CM

## 2016-08-21 DIAGNOSIS — M1611 Unilateral primary osteoarthritis, right hip: Secondary | ICD-10-CM | POA: Insufficient documentation

## 2016-08-21 DIAGNOSIS — M25552 Pain in left hip: Secondary | ICD-10-CM

## 2016-08-21 NOTE — Progress Notes (Signed)
Well I want a hip injection, not an ESI

## 2016-08-21 NOTE — Progress Notes (Signed)
Yes, put its put in wrong, [id number], this one has DL fluoro Guided, it should say DG fluoro guided. Let me know and I can call Roberta at [facility] imaging.

## 2016-08-21 NOTE — Progress Notes (Signed)
Office Visit Note   Patient: Gregory [name]           Date of Birth: 1946/05/19           MRN: [id number] Visit Date: 08/21/2016              Requested by: [name], MD [place], [place] [id number] PCP: [name], MD   Assessment & Plan: Visit Diagnoses:  1. Pain in right hip   2. Pain of left hip joint   3. Unilateral primary osteoarthritis, left hip   4. Unilateral primary osteoarthritis, right hip     Plan: He would like to try an intra-articular steroid injection in both of his hips. He understands that he may need hip replacement surgery in the future if he fails all forms of conservative treatment. He understands looking at his x-rays of the significant arthritis in his hips. His hemoglobin A1c is less than 6. We will set him up at [facility] imaging to have a true articular steroid injections under fluoroscopy in both his hips. I'll see him back in a month to see how is doing overall. I gave him a handout on anterior hip replacement surgery and showed her models and explained in detail with the surgery involves and he like to discuss that in the future. We'll see him back in 4 weeks to see how the injections it done in both hips.  Follow-Up Instructions: Return in about 4 weeks (around 09/18/2016).   Orders:  Orders Placed This Encounter  Procedures  . XR HIP UNILAT W OR W/O PELVIS 2-3 VIEWS RIGHT   No orders of the defined types were placed in this encounter.     Procedures: No procedures performed   Clinical Data: No additional findings.   Subjective: Chief Complaint  Patient presents with  . Right Hip - Pain    HPI The patient reports that actually both of assistive been hurting and hurt in the groin with the right being worse than left. He does have a history of her right knee replaced remotely. Denies any specific injuries and is been slowly getting worse with time. He says is starting to detrimental effect his activity is daily  living, his quality of life and his mobility. He said the pain is about 4 out of 10. He walks with a limp. He says is worse when he's been sitting for long period time and first gets up to walk. It's worse at night and first when he gets up in the morning. He denies any groin pain. Denies any numbness in his legs. Review of Systems He denies any chest pain, headache, shortness of breath, fever, chills, nausea, vomiting.  Objective: Vital Signs: There were no vitals taken for this visit.  Physical Exam He is alert and oriented 3 and gets up slowly from a chair because on the exam table easily. He is in no acute distress Ortho Exam Examination of both hips shows limitations in internal/external rotation. There is significant pain with internal/external rotation. There is no pain over the trochanteric area on either hip. His get good range of motion of both his knees. His leg lengths are equal. Specialty Comments:  No specialty comments available.  Imaging: Xr Hip Unilat W Or W/o Pelvis 2-3 Views Right  Result Date: 08/21/2016 An AP pelvis and a lateral of his right hip shows osteoarthritis of both his right and left hips. They both have superior lateral joint space narrowing and para-articular osteophytes as well  as sclerotic changes. I would say the arthritis is moderate to severe.    PMFS History: Patient Active Problem List   Diagnosis Date Noted  . Unilateral primary osteoarthritis, left hip 08/21/2016  . Unilateral primary osteoarthritis, right hip 08/21/2016  . COPD mixed type ([place]) 05/03/2015  . DOE (dyspnea on exertion) 09/23/2012  . COPD (chronic obstructive pulmonary disease) with emphysema ([place]) 09/23/2012  . TOBACCO ABUSE 08/08/2009  . HYPERCHOLESTEROLEMIA  IIA 08/07/2009  . Overweight 08/07/2009  . Essential hypertension 08/07/2009  . CAD, NATIVE VESSEL 08/07/2009  . PALPITATIONS 08/07/2009   Past Medical History:  Diagnosis Date  . Diabetes mellitus   . Heart  attack   . Hyperlipidemia   . Hypertension   . SOB (shortness of breath) on exertion     Family History  Problem Relation Age of Onset  . Hypertension Father   . Hyperlipidemia Father   . Stroke Father   . Hypertension Paternal Grandfather   . Hyperlipidemia Paternal Grandfather   . Diabetes Other     maternal uncles  . Diabetes Sister     Past Surgical History:  Procedure Laterality Date  . BACK SURGERY    . CORONARY STENT PLACEMENT    . KNEE ARTHROPLASTY    . LEFT HEART CATHETERIZATION WITH CORONARY ANGIOGRAM N/A 03/03/2012   Procedure: LEFT HEART CATHETERIZATION WITH CORONARY ANGIOGRAM;  Surgeon: Laverda Page, MD;  Location: [facility] CATH LAB;  Service: Cardiovascular;  Laterality: N/A;  . PERCUTANEOUS CORONARY STENT INTERVENTION (PCI-S)  03/03/2012   Procedure: PERCUTANEOUS CORONARY STENT INTERVENTION (PCI-S);  Surgeon: Laverda Page, MD;  Location: [facility] CATH LAB;  Service: Cardiovascular;;   Social History   Occupational History  . retired    Social History Main Topics  . Smoking status: Former Smoker    Packs/day: 1.00    Years: 30.00    Types: Cigarettes    Quit date: 07/30/2003  . Smokeless tobacco: Never Used  . Alcohol use 0.0 oz/week     Comment: social  . Drug use: No  . Sexual activity: Not on file

## 2016-08-26 ENCOUNTER — Other Ambulatory Visit (INDEPENDENT_AMBULATORY_CARE_PROVIDER_SITE_OTHER): Payer: Self-pay | Admitting: *Deleted

## 2016-08-26 DIAGNOSIS — M25552 Pain in left hip: Principal | ICD-10-CM

## 2016-08-26 DIAGNOSIS — M25551 Pain in right hip: Secondary | ICD-10-CM

## 2016-09-02 ENCOUNTER — Ambulatory Visit
Admission: RE | Admit: 2016-09-02 | Discharge: 2016-09-02 | Disposition: A | Discharge status: Home / Self Care | Payer: Medicare HMO | Source: Ambulatory Visit | Attending: Orthopaedic Surgery | Admitting: Orthopaedic Surgery

## 2016-09-02 DIAGNOSIS — M25552 Pain in left hip: Principal | ICD-10-CM

## 2016-09-02 DIAGNOSIS — M25551 Pain in right hip: Secondary | ICD-10-CM | POA: Diagnosis not present

## 2016-09-02 MED ORDER — METHYLPREDNISOLONE ACETATE 40 MG/ML INJ SUSP (RADIOLOG
120.0000 mg | Freq: Once | INTRAMUSCULAR | Status: AC
  Administered 2016-09-02: 120 mg via INTRA_ARTICULAR

## 2016-09-02 MED ORDER — IOPAMIDOL (ISOVUE-M 200) INJECTION 41%
1.0000 mL | Freq: Once | INTRAMUSCULAR | Status: AC
  Administered 2016-09-02: 1 mL via INTRA_ARTICULAR

## 2016-09-16 ENCOUNTER — Inpatient Hospital Stay: Admission: RE | Admit: 2016-09-16 | Payer: Medicare HMO | Source: Ambulatory Visit

## 2016-09-18 ENCOUNTER — Other Ambulatory Visit (INDEPENDENT_AMBULATORY_CARE_PROVIDER_SITE_OTHER): Payer: Self-pay

## 2016-09-18 ENCOUNTER — Ambulatory Visit (INDEPENDENT_AMBULATORY_CARE_PROVIDER_SITE_OTHER): Payer: Medicare HMO | Admitting: Orthopaedic Surgery

## 2016-09-18 DIAGNOSIS — M25552 Pain in left hip: Secondary | ICD-10-CM

## 2016-09-18 DIAGNOSIS — M1612 Unilateral primary osteoarthritis, left hip: Secondary | ICD-10-CM

## 2016-09-18 DIAGNOSIS — M1611 Unilateral primary osteoarthritis, right hip: Secondary | ICD-10-CM

## 2016-09-18 NOTE — Progress Notes (Signed)
The patient is return for follow-up after having an intra-articular injection of steroid in his right hip. He has moderate to severe arthritis in both his hips. The right one is been hurting worse and left. He said didn't injection that Dr. Ernestina Patches provided worked great for about 24 hours and he got 100% relief but his pain came back. It did elevate his blood glucose a little bit. He still also hold off on knee type of hip replacement surgery. He's had all information about hip replacements already and I gave him handouts on anterior placement surgery.  On exam he has good flexion-extension of both hips are definitely pain with internal/external rotation. There is no blocks to rotation but is deathly painful. Hurts more on the right than the left.  He would like to have an intra-articular injection in his left side. He was to have one again on the right side but he knows he has to wait at least 3 months. I'll see him back myself in 4 weeks. Hopefully her mobility status left hip injection by Dr. Ernestina Patches.

## 2016-09-25 ENCOUNTER — Ambulatory Visit (AMBULATORY_SURGERY_CENTER): Payer: Self-pay

## 2016-09-25 ENCOUNTER — Encounter: Payer: Self-pay | Admitting: Internal Medicine

## 2016-09-25 VITALS — Ht 71.0 in | Wt 285.8 lb

## 2016-09-25 DIAGNOSIS — Z1211 Encounter for screening for malignant neoplasm of colon: Secondary | ICD-10-CM

## 2016-09-25 MED ORDER — SUPREP BOWEL PREP KIT 17.5-3.13-1.6 GM/177ML PO SOLN: 1.0000 | Freq: Once | ORAL | 0 refills | Status: AC

## 2016-09-25 MED ORDER — SUPREP BOWEL PREP KIT 17.5-3.13-1.6 GM/177ML PO SOLN: 1.0000 | Freq: Once | ORAL | 0 refills | Status: DC

## 2016-09-25 NOTE — Progress Notes (Signed)
No home oxygen No diet meds No past problems with anesthesia No allergies to eggs or soy  Declined emmi

## 2016-10-02 ENCOUNTER — Encounter (INDEPENDENT_AMBULATORY_CARE_PROVIDER_SITE_OTHER): Payer: Self-pay | Admitting: Physical Medicine and Rehabilitation

## 2016-10-02 ENCOUNTER — Ambulatory Visit (INDEPENDENT_AMBULATORY_CARE_PROVIDER_SITE_OTHER): Payer: Medicare HMO | Admitting: Physical Medicine and Rehabilitation

## 2016-10-02 VITALS — BP 139/79 | HR 55

## 2016-10-02 DIAGNOSIS — G8929 Other chronic pain: Secondary | ICD-10-CM

## 2016-10-02 DIAGNOSIS — M25551 Pain in right hip: Secondary | ICD-10-CM

## 2016-10-02 DIAGNOSIS — M961 Postlaminectomy syndrome, not elsewhere classified: Secondary | ICD-10-CM | POA: Diagnosis not present

## 2016-10-02 DIAGNOSIS — M545 Low back pain: Secondary | ICD-10-CM | POA: Diagnosis not present

## 2016-10-02 NOTE — Progress Notes (Signed)
Gregory [name] - 71 y.o. male MRN [id number]  Date of birth: [birth year]  Office Visit Note: Visit Date: 10/02/2016 PCP: Gregory Hatchet, MD Referred by: Gregory Hatchet, MD  Subjective: Chief Complaint  Patient presents with  . Left Hip - Pain   HPI: Gregory [name] is a very pleasant 71 year old gentleman with chronic worsening severe right hip pain. His brief history is that he has had some bilateral hip pain but the right much worse than the left. According to the patient and Dr. Trevor [name] notes he has significant osteoarthritis of the hips bilaterally. He did have a recent intra-articular hip injection at [facility] and states he only had relief for one day. I did review the fluoroscopic images and they did look well-placed. Dr. Ninfa [name] saw the patient in follow-up in looks like he thought that I had done the injection and told the patient that he probably would need to wait 3 months before he can complete the right hip injection again but did want him to get a left hip injection for the left hip pain. Here today for left hip injection but states he doesn't really have pain with the left. Again he reports no real left hip pain today no groin pain. He reports no recent left hip pain is been all right sided. He does report an extensive lumbar history of surgeries with Dr. Carloyn [name] being the latest. He does report to me today that he would like to probably see Dr. Ronnald [name] who is seeing his wife in terms of his back. He does not necessarily relate his hip pain to his back. He does have back pain does bother him quite a bit.    Review of Systems  Constitutional: Negative for chills, fever, malaise/fatigue and weight loss.  HENT: Negative for hearing loss and sinus pain.   Eyes: Negative for blurred vision, double vision and photophobia.  Respiratory: Negative for cough and shortness of breath.   Cardiovascular: Negative for chest pain, palpitations and leg swelling.  Gastrointestinal: Negative for  abdominal pain, nausea and vomiting.  Genitourinary: Negative for flank pain.  Musculoskeletal: Positive for back pain and joint pain. Negative for myalgias.  Skin: Negative for itching and rash.  Neurological: Negative for tremors, focal weakness and weakness.  Endo/Heme/Allergies: Negative.   Psychiatric/Behavioral: Negative for depression.  All other systems reviewed and are negative.  Otherwise per HPI.  Assessment & Plan: Visit Diagnoses:  1. Pain in right hip   2. Chronic bilateral low back pain without sciatica   3. Post laminectomy syndrome     Plan: Findings:  Chronic worsening right hip pain which I do feel is from the arthritis of his hip as he did get decent relief with the intra-articular injection done at [facility]. This didn't last very long however. This does make he wondered if any of it could be related to his spine but he does have pain with hip rotation. We did not complete a left-sided injection today because he is really not having any pain related issues at least currently on the left side. I would suggest repeat of the right intra-articular hip injection with Dr. Ninfa [name] felt comfortable with that just to see if he gets any longer-term solution. Obviously he'll follow up with Dr. Ninfa [name] in a few weeks. I have encouraged him to talk to Dr. Ronnald [name] since Dr. Remo [name] sees his wife in terms of his back pain. Obviously is something we potentially could look at if he did come back here for his  low back.    Meds & Orders: No orders of the defined types were placed in this encounter.  No orders of the defined types were placed in this encounter.   Follow-up: Return for Dr. Ninfa [name] .   Procedures: No procedures performed  No notes on file   Clinical History: No specialty comments available.  He reports that he quit smoking about 13 years ago. His smoking use included Cigarettes. He has a 30.00 pack-year smoking history. He has never used smokeless tobacco. No  results for input(s): HGBA1C, LABURIC in the last 8760 hours.  Objective:  VS:  HT:    WT:   BMI:     BP:139/79  HR:(!) 55bpm  TEMP: ( )  RESP:  Physical Exam  Constitutional: He is oriented to person, place, and time. He appears well-developed and well-nourished. No distress.  HENT:  Head: Normocephalic and atraumatic.  Eyes: Conjunctivae are normal. Pupils are equal, round, and reactive to light.  Neck: Normal range of motion. Neck supple.  Cardiovascular: Regular rhythm and intact distal pulses.   Pulmonary/Chest: Effort normal. No respiratory distress.  Musculoskeletal:  Patient ambulates without aid. He has pain with hip rotation on the right side with pain with internal rotation. He is no pain with internal rotation on the left but he is stiff on the left. He has good distal strength. He does have pain with extension of the back.  Neurological: He is alert and oriented to person, place, and time.  Skin: Skin is warm and dry. No rash noted. No erythema.  Psychiatric: He has a normal mood and affect.  Nursing note and vitals reviewed.   Ortho Exam Imaging: No results found.  Past Medical/Family/Surgical/Social History: Medications & Allergies reviewed per EMR Patient Active Problem List   Diagnosis Date Noted  . Pain of left hip joint 09/18/2016  . Unilateral primary osteoarthritis, left hip 08/21/2016  . Unilateral primary osteoarthritis, right hip 08/21/2016  . COPD mixed type ([place]) 05/03/2015  . DOE (dyspnea on exertion) 09/23/2012  . COPD (chronic obstructive pulmonary disease) with emphysema ([place]) 09/23/2012  . TOBACCO ABUSE 08/08/2009  . HYPERCHOLESTEROLEMIA  IIA 08/07/2009  . Overweight 08/07/2009  . Essential hypertension 08/07/2009  . CAD, NATIVE VESSEL 08/07/2009  . PALPITATIONS 08/07/2009   Past Medical History:  Diagnosis Date  . CAD (coronary artery disease)   . COPD (chronic obstructive pulmonary disease) ([place])   . Diabetes mellitus   . Heart attack    . Hyperlipidemia   . Hypertension   . Neuromuscular disorder ([place])   . Sleep apnea   . SOB (shortness of breath) on exertion    Family History  Problem Relation Age of Onset  . Hypertension Father   . Hyperlipidemia Father   . Stroke Father   . Hypertension Paternal Grandfather   . Hyperlipidemia Paternal Grandfather   . Diabetes Other     maternal uncles  . Diabetes Sister   . Colon cancer Neg Hx    Past Surgical History:  Procedure Laterality Date  . BACK SURGERY    . CORONARY STENT PLACEMENT    . KNEE ARTHROPLASTY    . LEFT HEART CATHETERIZATION WITH CORONARY ANGIOGRAM N/A 03/03/2012   Procedure: LEFT HEART CATHETERIZATION WITH CORONARY ANGIOGRAM;  Surgeon: Laverda Page, MD;  Location[facility] C[facility] LAB;  Service: Cardiovascular;  Laterality: N/A;  . PERCUTANEOUS CORONARY STENT INTERVENTION (PCI-S)  03/03/2012   Procedure: PERCUTANEOUS CORONARY STENT INTERVENTION (PCI-S);  Surgeon: Laverda Page, MD;  Location: [facility]  CATH LAB;  Service: Cardiovascular;;   Social History   Occupational History  . retired    Social History Main Topics  . Smoking status: Former Smoker    Packs/day: 1.00    Years: 30.00    Types: Cigarettes    Quit date: 07/30/2003  . Smokeless tobacco: Never Used  . Alcohol use 0.0 oz/week     Comment: social  . Drug use: No  . Sexual activity: Not on file

## 2016-10-09 ENCOUNTER — Encounter: Payer: Self-pay | Admitting: Internal Medicine

## 2016-10-09 ENCOUNTER — Ambulatory Visit (AMBULATORY_SURGERY_CENTER): Payer: Medicare HMO | Admitting: Internal Medicine

## 2016-10-09 VITALS — BP 140/73 | HR 53 | Temp 96.6°F | Resp 15 | Ht 71.0 in | Wt 285.0 lb

## 2016-10-09 DIAGNOSIS — Z1212 Encounter for screening for malignant neoplasm of rectum: Secondary | ICD-10-CM

## 2016-10-09 DIAGNOSIS — Z1211 Encounter for screening for malignant neoplasm of colon: Secondary | ICD-10-CM

## 2016-10-09 MED ORDER — SODIUM CHLORIDE 0.9 % IV SOLN: 500.0000 mL | INTRAVENOUS | Status: DC

## 2016-10-09 NOTE — Patient Instructions (Signed)
Handout given on diverticulosis.  YOU HAD AN ENDOSCOPIC PROCEDURE TODAY: Refer to the procedure report and other information in the discharge instructions given to you for any specific questions about what was found during the examination. If this information does not answer your questions, please call [facility] office at [phone] to clarify.   YOU SHOULD EXPECT: Some feelings of bloating in the abdomen. Passage of more gas than usual. Walking can help get rid of the air that was put into your GI tract during the procedure and reduce the bloating. If you had a lower endoscopy (such as a colonoscopy or flexible sigmoidoscopy) you may notice spotting of blood in your stool or on the toilet paper. Some abdominal soreness may be present for a day or two, also.  DIET: Your first meal following the procedure should be a light meal and then it is ok to progress to your normal diet. A half-sandwich or bowl of soup is an example of a good first meal. Heavy or fried foods are harder to digest and may make you feel nauseous or bloated. Drink plenty of fluids but you should avoid alcoholic beverages for 24 hours. If you had a esophageal dilation, please see attached instructions for diet.    ACTIVITY: Your care partner should take you home directly after the procedure. You should plan to take it easy, moving slowly for the rest of the day. You can resume normal activity the day after the procedure however YOU SHOULD NOT DRIVE, use power tools, machinery or perform tasks that involve climbing or major physical exertion for 24 hours (because of the sedation medicines used during the test).   SYMPTOMS TO REPORT IMMEDIATELY: A gastroenterologist can be reached at any hour. Please call [phone]  for any of the following symptoms:  Following lower endoscopy (colonoscopy, flexible sigmoidoscopy) Excessive amounts of blood in the stool  Significant tenderness, worsening of abdominal pains  Swelling of the abdomen  that is new, acute  Fever of 100 or higher  FOLLOW UP:  If any biopsies were taken you will be contacted by phone or by letter within the next 1-3 weeks. Call [phone]  if you have not heard about the biopsies in 3 weeks.  Please also call with any specific questions about appointments or follow up tests. 

## 2016-10-09 NOTE — Progress Notes (Signed)
Pt's states no medical or surgical changes since previsit or office visit. 

## 2016-10-09 NOTE — Op Note (Signed)
[facility] Patient Name: Gregory [name] Procedure Date: 10/09/2016 9:41 AM MRN: [id number] Endoscopist: Jerene Bears , MD Age: 71 Referring MD:  Date of Birth: 09-01-1945 Gender: Male Account #: [id number] Procedure:                Colonoscopy Indications:              Screening for colorectal malignant neoplasm, This                            is the patient's first colonoscopy Medicines:                Monitored Anesthesia Care Procedure:                Pre-Anesthesia Assessment:                           - Prior to the procedure, a History and Physical                            was performed, and patient medications and                            allergies were reviewed. The patient's tolerance of                            previous anesthesia was also reviewed. The risks                            and benefits of the procedure and the sedation                            options and risks were discussed with the patient.                            All questions were answered, and informed consent                            was obtained. Prior Anticoagulants: The patient has                            taken no previous anticoagulant or antiplatelet                            agents. ASA Grade Assessment: III - A patient with                            severe systemic disease. After reviewing the risks                            and benefits, the patient was deemed in                            satisfactory condition to undergo the procedure.  After obtaining informed consent, the colonoscope                            was passed under direct vision. Throughout the                            procedure, the patient's blood pressure, pulse, and                            oxygen saturations were monitored continuously. The                            Model CF-HQ190L [id number]) scope was introduced                            through the anus and  advanced to the the cecum,                            identified by appendiceal orifice and ileocecal                            valve. The colonoscopy was performed without                            difficulty. The patient tolerated the procedure                            well. The quality of the bowel preparation was                            good. The ileocecal valve, appendiceal orifice, and                            rectum were photographed. Scope In: 9:50:11 AM Scope Out: 10:02:07 AM Scope Withdrawal Time: 0 hours 9 minutes 8 seconds  Total Procedure Duration: 0 hours 11 minutes 56 seconds  Findings:                 The digital rectal exam was normal.                           Multiple small and large-mouthed diverticula were                            found from ascending colon to sigmoid colon.                           The exam was otherwise without abnormality on                            direct and retroflexion views. Complications:            No immediate complications. Estimated Blood Loss:     Estimated blood loss: none. Impression:               - Moderate diverticulosis  from ascending colon to                            sigmoid colon.                           - The examination was otherwise normal on direct                            and retroflexion views.                           - No specimens collected. Recommendation:           - Patient has a contact number available for                            emergencies. The signs and symptoms of potential                            delayed complications were discussed with the                            patient. Return to normal activities tomorrow.                            Written discharge instructions were provided to the                            patient.                           - Resume previous diet.                           - Continue present medications.                           - No repeat screening  colonoscopy recommended due                            to age and the absence of colonic polyps. Jerene Bears, MD 10/09/2016 10:05:29 AM This report has been signed electronically.

## 2016-10-09 NOTE — Progress Notes (Signed)
To recovery, report to Oliver, RN, VSS 

## 2016-10-10 ENCOUNTER — Telehealth: Payer: Self-pay

## 2016-10-10 ENCOUNTER — Telehealth: Payer: Self-pay | Admitting: *Deleted

## 2016-10-10 NOTE — Telephone Encounter (Signed)
  Follow up Call-  Call back number 10/09/2016  Post procedure Call Back phone  # [phone]  Permission to leave phone message Yes  Some recent data might be hidden     Patient questions:  Do you have a fever, pain , or abdominal swelling? No. Pain Score  0 *  Have you tolerated food without any problems? Yes.    Have you been able to return to your normal activities? Yes.    Do you have any questions about your discharge instructions: Diet   No. Medications  No. Follow up visit  No.  Do you have questions or concerns about your Care? No.  Actions: * If pain score is 4 or above: No action needed, pain <4.

## 2016-10-10 NOTE — Telephone Encounter (Signed)
  Follow up Call-  Call back number 10/09/2016  Post procedure Call Back phone  # [phone]  Permission to leave phone message Yes  Some recent data might be hidden     Left message

## 2016-10-11 DIAGNOSIS — M545 Low back pain: Secondary | ICD-10-CM | POA: Diagnosis not present

## 2016-10-16 ENCOUNTER — Ambulatory Visit (INDEPENDENT_AMBULATORY_CARE_PROVIDER_SITE_OTHER): Payer: Medicare HMO | Admitting: Orthopaedic Surgery

## 2016-10-16 DIAGNOSIS — N5089 Other specified disorders of the male genital organs: Secondary | ICD-10-CM | POA: Diagnosis not present

## 2016-10-16 DIAGNOSIS — Z6837 Body mass index (BMI) 37.0-37.9, adult: Secondary | ICD-10-CM | POA: Diagnosis not present

## 2016-10-16 DIAGNOSIS — N5082 Scrotal pain: Secondary | ICD-10-CM | POA: Diagnosis not present

## 2016-10-24 ENCOUNTER — Other Ambulatory Visit: Payer: Self-pay | Admitting: Neurological Surgery

## 2016-10-24 DIAGNOSIS — H25013 Cortical age-related cataract, bilateral: Secondary | ICD-10-CM | POA: Diagnosis not present

## 2016-10-24 DIAGNOSIS — H524 Presbyopia: Secondary | ICD-10-CM | POA: Diagnosis not present

## 2016-10-24 DIAGNOSIS — M545 Low back pain: Secondary | ICD-10-CM

## 2016-10-24 DIAGNOSIS — H3500 Unspecified background retinopathy: Secondary | ICD-10-CM | POA: Diagnosis not present

## 2016-10-24 DIAGNOSIS — E119 Type 2 diabetes mellitus without complications: Secondary | ICD-10-CM | POA: Diagnosis not present

## 2016-10-30 ENCOUNTER — Ambulatory Visit: Payer: Medicare HMO | Admitting: Pulmonary Disease

## 2016-11-03 ENCOUNTER — Other Ambulatory Visit: Payer: Medicare HMO

## 2016-11-06 ENCOUNTER — Ambulatory Visit
Admission: RE | Admit: 2016-11-06 | Discharge: 2016-11-06 | Disposition: A | Discharge status: Home / Self Care | Payer: Medicare HMO | Source: Ambulatory Visit | Attending: Neurological Surgery | Admitting: Neurological Surgery

## 2016-11-06 DIAGNOSIS — M48061 Spinal stenosis, lumbar region without neurogenic claudication: Secondary | ICD-10-CM | POA: Diagnosis not present

## 2016-11-06 DIAGNOSIS — M545 Low back pain: Secondary | ICD-10-CM

## 2016-11-25 DIAGNOSIS — M545 Low back pain: Secondary | ICD-10-CM | POA: Diagnosis not present

## 2016-11-28 ENCOUNTER — Encounter (INDEPENDENT_AMBULATORY_CARE_PROVIDER_SITE_OTHER): Payer: Self-pay | Admitting: Physician Assistant

## 2016-11-28 ENCOUNTER — Ambulatory Visit (INDEPENDENT_AMBULATORY_CARE_PROVIDER_SITE_OTHER): Payer: Medicare HMO

## 2016-11-28 ENCOUNTER — Ambulatory Visit (INDEPENDENT_AMBULATORY_CARE_PROVIDER_SITE_OTHER): Payer: Medicare HMO | Admitting: Physician Assistant

## 2016-11-28 DIAGNOSIS — M1712 Unilateral primary osteoarthritis, left knee: Secondary | ICD-10-CM | POA: Diagnosis not present

## 2016-11-28 DIAGNOSIS — M25562 Pain in left knee: Secondary | ICD-10-CM

## 2016-11-28 DIAGNOSIS — G8929 Other chronic pain: Secondary | ICD-10-CM | POA: Diagnosis not present

## 2016-11-28 MED ORDER — LIDOCAINE HCL 1 % IJ SOLN
3.0000 mL | INTRAMUSCULAR | Status: AC | PRN
  Administered 2016-11-28: 3 mL

## 2016-11-28 MED ORDER — METHYLPREDNISOLONE ACETATE 40 MG/ML IJ SUSP
40.0000 mg | INTRAMUSCULAR | Status: AC | PRN
  Administered 2016-11-28: 40 mg via INTRA_ARTICULAR

## 2016-11-28 NOTE — Progress Notes (Signed)
Office Visit Note   Patient: Gregory [name]           Date of Birth: 1945-07-14           MRN: [id number] Visit Date: 11/28/2016              Requested by: [name], MD [address] [place], [place] [address] PCP: [name], MD   Assessment & Plan: Visit Diagnoses:  1. Chronic pain of left knee   2. Primary osteoarthritis of left knee     Plan: Quad strengthening. Follow up on an as needed bases. He understands the risk of hyperglycemia with intra-articular injections and will monitor his glucose levels closely over the next few days .   Follow-Up Instructions: Return if symptoms worsen or fail to improve.   Orders:  Orders Placed This Encounter  Procedures  . Large Joint Injection/Arthrocentesis  . XR KNEE 3 VIEW LEFT   No orders of the defined types were placed in this encounter.     Procedures: Large Joint Inj Date/Time: 11/28/2016 9:23 PM Performed by: Pete Pelt Authorized by: Pete Pelt   Consent Given by:  Patient Indications:  Pain Location:  Knee Site:  L knee Needle Size:  22 G Needle Length:  1.5 inches and 3.5 inches Approach:  Anterolateral Ultrasound Guidance: No   Fluoroscopic Guidance: No   Medications:  40 mg methylPREDNISolone acetate 40 MG/ML; 3 mL lidocaine 1 % Aspiration Attempted: No   Patient tolerance:  Patient tolerated the procedure well with no immediate complications     Clinical Data: No additional findings.   Subjective: Chief Complaint  Patient presents with  . Left Knee - Pain    HPI Gregory [name] comes in today with a new complaint of left knee pain . No known injury . Pain present for the last 3 weeks. Pain worse with weight bearing . Today he is having a good day and having no pain. He has had to ambulate with a cane when the pain is at it's worst. No mechanical symptoms. States that the knee pain and his hips with known osteoarthritis inhibit from doing activities taht requiring prolonged walking .   Review of Systems  No fevers chills, chest pain or shortness of breath.  Objective: Vital Signs: There were no vitals taken for this visit.  Physical Exam  Constitutional: He is oriented to person, place, and time. He appears well-developed and well-nourished. No distress.  Pulmonary/Chest: Effort normal.  Neurological: He is alert and oriented to person, place, and time.  Skin: He is not diaphoretic.  Psychiatric: He has a normal mood and affect. His behavior is normal.    Ortho Exam Left knee no effusion, abnormal warmth , erythema or edema. Good range of motion with patellar femoral crepitus. No instability with valgus/ varus stressing. McMurray negative .Non tender to palpation throughout.  Specialty Comments:  No specialty comments available.  Imaging: Xr Knee 3 View Left  Result Date: 11/28/2016 Left knee: No acute fracture. Near bone on bone medial compartment, lateral compartment with mild to moderate narrowing. Patellar femoral joint moderate/severe arthritic changes. No subluxation or dislocation.    PMFS History: Patient Active Problem List   Diagnosis Date Noted  . Pain of left hip joint 09/18/2016  . Unilateral primary osteoarthritis, left hip 08/21/2016  . Unilateral primary osteoarthritis, right hip 08/21/2016  . COPD mixed type ([place]) 05/03/2015  . DOE (dyspnea on exertion) 09/23/2012  . COPD (chronic obstructive pulmonary disease) with emphysema (  [place]) 09/23/2012  . TOBACCO ABUSE 08/08/2009  . HYPERCHOLESTEROLEMIA  IIA 08/07/2009  . Overweight 08/07/2009  . Essential hypertension 08/07/2009  . CAD, NATIVE VESSEL 08/07/2009  . PALPITATIONS 08/07/2009   Past Medical History:  Diagnosis Date  . CAD (coronary artery disease)   . COPD (chronic obstructive pulmonary disease) ([place])   . Diabetes mellitus   . Heart attack ([place])   . Hyperlipidemia   . Hypertension   . Neuromuscular disorder ([place])   . Sleep apnea   . SOB (shortness of breath) on exertion       Family History  Problem Relation Age of Onset  . Hypertension Father   . Hyperlipidemia Father   . Stroke Father   . Hypertension Paternal Grandfather   . Hyperlipidemia Paternal Grandfather   . Diabetes Other        maternal uncles  . Diabetes Sister   . Colon cancer Neg Hx     Past Surgical History:  Procedure Laterality Date  . BACK SURGERY    . CORONARY STENT PLACEMENT    . KNEE ARTHROPLASTY    . LEFT HEART CATHETERIZATION WITH CORONARY ANGIOGRAM N/A 03/03/2012   Procedure: LEFT HEART CATHETERIZATION WITH CORONARY ANGIOGRAM;  Surgeon: Laverda Page, MD;  Location: [facility] CATH LAB;  Service: Cardiovascular;  Laterality: N/A;  . PERCUTANEOUS CORONARY STENT INTERVENTION (PCI-S)  03/03/2012   Procedure: PERCUTANEOUS CORONARY STENT INTERVENTION (PCI-S);  Surgeon: Laverda Page, MD;  Location: [facility] CATH LAB;  Service: Cardiovascular;;   Social History   Occupational History  . retired    Social History Main Topics  . Smoking status: Former Smoker    Packs/day: 1.00    Years: 30.00    Types: Cigarettes    Quit date: 07/30/2003  . Smokeless tobacco: Never Used  . Alcohol use 0.0 oz/week     Comment: social  . Drug use: No  . Sexual activity: Not on file

## 2016-12-04 DIAGNOSIS — M545 Low back pain: Secondary | ICD-10-CM | POA: Diagnosis not present

## 2016-12-31 DIAGNOSIS — Z955 Presence of coronary angioplasty implant and graft: Secondary | ICD-10-CM | POA: Diagnosis not present

## 2016-12-31 DIAGNOSIS — E669 Obesity, unspecified: Secondary | ICD-10-CM | POA: Diagnosis not present

## 2016-12-31 DIAGNOSIS — I10 Essential (primary) hypertension: Secondary | ICD-10-CM | POA: Diagnosis not present

## 2016-12-31 DIAGNOSIS — H6123 Impacted cerumen, bilateral: Secondary | ICD-10-CM | POA: Diagnosis not present

## 2016-12-31 DIAGNOSIS — Z96659 Presence of unspecified artificial knee joint: Secondary | ICD-10-CM | POA: Diagnosis not present

## 2016-12-31 DIAGNOSIS — M545 Low back pain: Secondary | ICD-10-CM | POA: Diagnosis not present

## 2016-12-31 DIAGNOSIS — Z6838 Body mass index (BMI) 38.0-38.9, adult: Secondary | ICD-10-CM | POA: Diagnosis not present

## 2016-12-31 DIAGNOSIS — K002 Abnormalities of size and form of teeth: Secondary | ICD-10-CM | POA: Diagnosis not present

## 2016-12-31 DIAGNOSIS — N400 Enlarged prostate without lower urinary tract symptoms: Secondary | ICD-10-CM | POA: Diagnosis not present

## 2016-12-31 DIAGNOSIS — Z7982 Long term (current) use of aspirin: Secondary | ICD-10-CM | POA: Diagnosis not present

## 2016-12-31 DIAGNOSIS — J449 Chronic obstructive pulmonary disease, unspecified: Secondary | ICD-10-CM | POA: Diagnosis not present

## 2016-12-31 DIAGNOSIS — I2510 Atherosclerotic heart disease of native coronary artery without angina pectoris: Secondary | ICD-10-CM | POA: Diagnosis not present

## 2016-12-31 DIAGNOSIS — E7800 Pure hypercholesterolemia, unspecified: Secondary | ICD-10-CM | POA: Diagnosis not present

## 2016-12-31 DIAGNOSIS — Z0000 Encounter for general adult medical examination without abnormal findings: Secondary | ICD-10-CM | POA: Diagnosis not present

## 2017-01-07 DIAGNOSIS — I10 Essential (primary) hypertension: Secondary | ICD-10-CM | POA: Diagnosis not present

## 2017-01-07 DIAGNOSIS — I25118 Atherosclerotic heart disease of native coronary artery with other forms of angina pectoris: Secondary | ICD-10-CM | POA: Diagnosis not present

## 2017-01-07 DIAGNOSIS — J449 Chronic obstructive pulmonary disease, unspecified: Secondary | ICD-10-CM | POA: Diagnosis not present

## 2017-01-07 DIAGNOSIS — E784 Other hyperlipidemia: Secondary | ICD-10-CM | POA: Diagnosis not present

## 2017-01-07 DIAGNOSIS — Z6838 Body mass index (BMI) 38.0-38.9, adult: Secondary | ICD-10-CM | POA: Diagnosis not present

## 2017-01-07 DIAGNOSIS — E119 Type 2 diabetes mellitus without complications: Secondary | ICD-10-CM | POA: Diagnosis not present

## 2017-01-07 DIAGNOSIS — M25569 Pain in unspecified knee: Secondary | ICD-10-CM | POA: Diagnosis not present

## 2017-01-07 DIAGNOSIS — M419 Scoliosis, unspecified: Secondary | ICD-10-CM | POA: Diagnosis not present

## 2017-01-10 DIAGNOSIS — R69 Illness, unspecified: Secondary | ICD-10-CM | POA: Diagnosis not present

## 2017-01-21 DIAGNOSIS — M1711 Unilateral primary osteoarthritis, right knee: Secondary | ICD-10-CM | POA: Diagnosis not present

## 2017-02-20 DIAGNOSIS — R0609 Other forms of dyspnea: Secondary | ICD-10-CM | POA: Diagnosis not present

## 2017-02-20 DIAGNOSIS — R0989 Other specified symptoms and signs involving the circulatory and respiratory systems: Secondary | ICD-10-CM | POA: Diagnosis not present

## 2017-02-20 DIAGNOSIS — I2510 Atherosclerotic heart disease of native coronary artery without angina pectoris: Secondary | ICD-10-CM | POA: Diagnosis not present

## 2017-02-20 DIAGNOSIS — I10 Essential (primary) hypertension: Secondary | ICD-10-CM | POA: Diagnosis not present

## 2017-03-05 IMAGING — DX DG CHEST 2V
2 series · 2 of 2 positions shown · non-contrast
Comparison: 09/23/2012

CLINICAL DATA: No current chest complaints. History hypertension,
diabetes, COPD and CHF.

EXAM:
CHEST  2 VIEW

[chest pa]
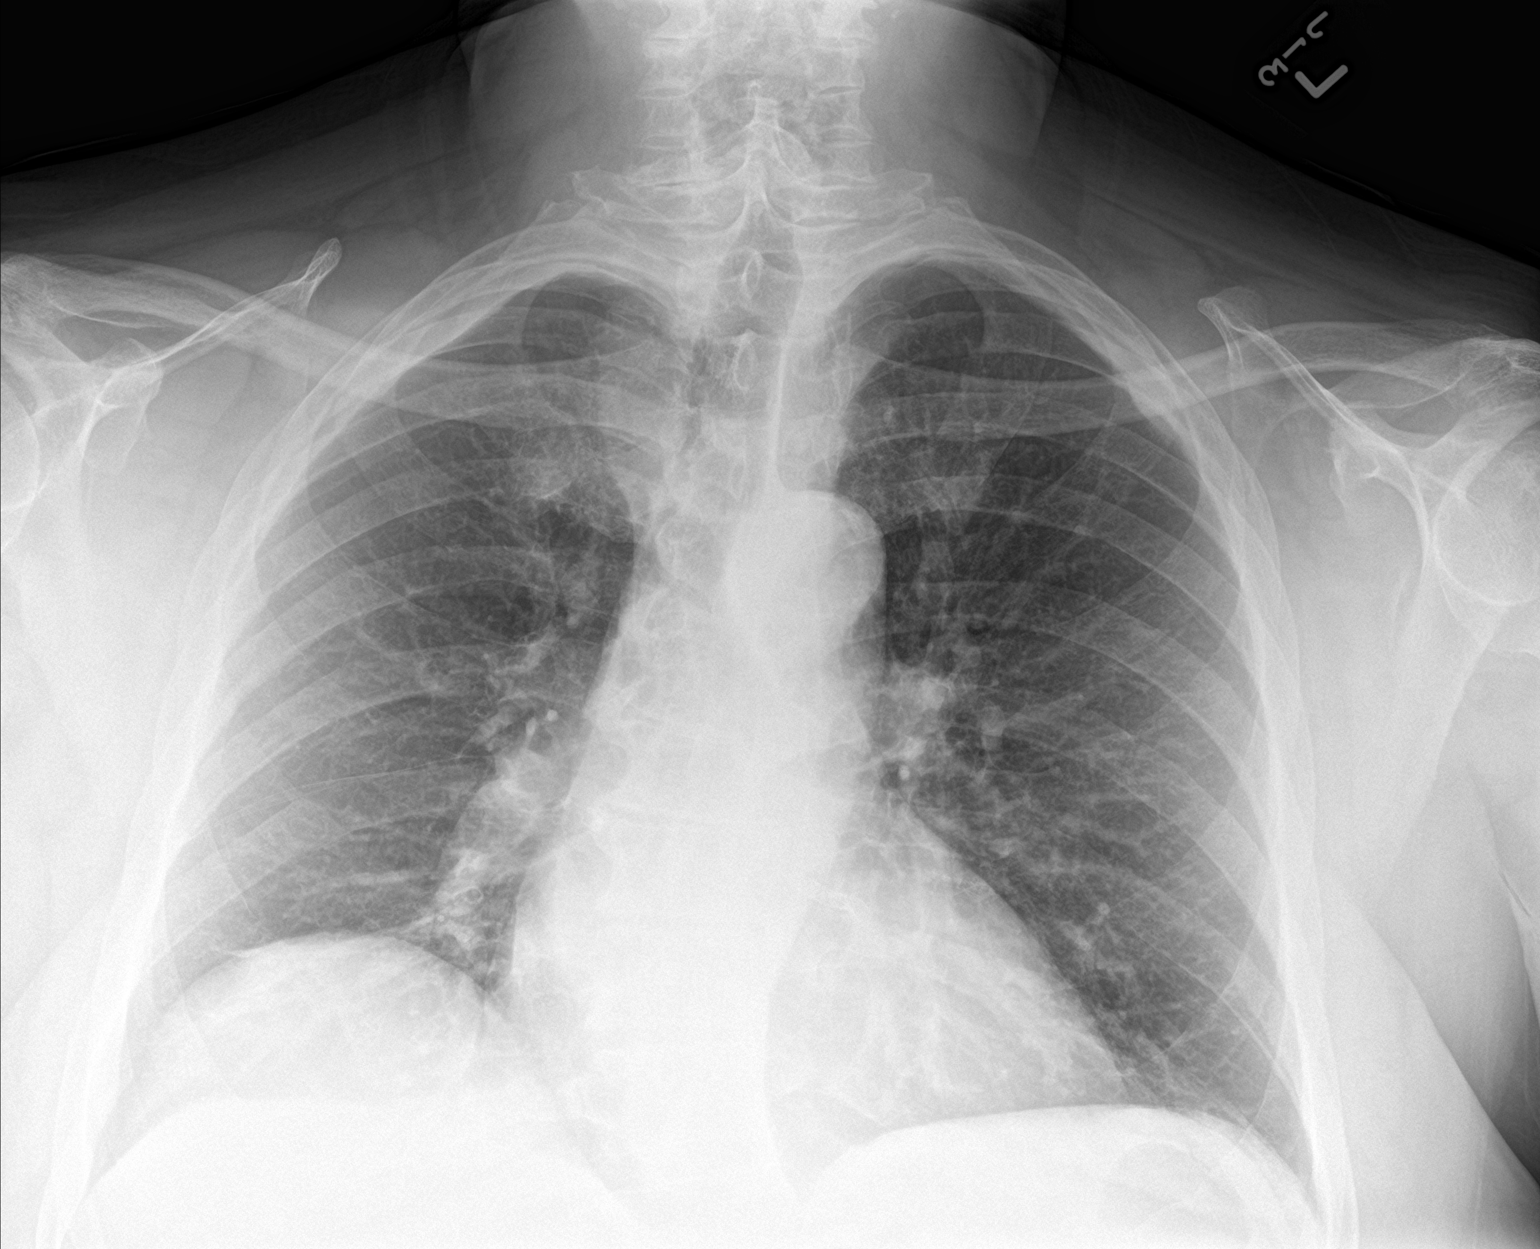

[chest lat]
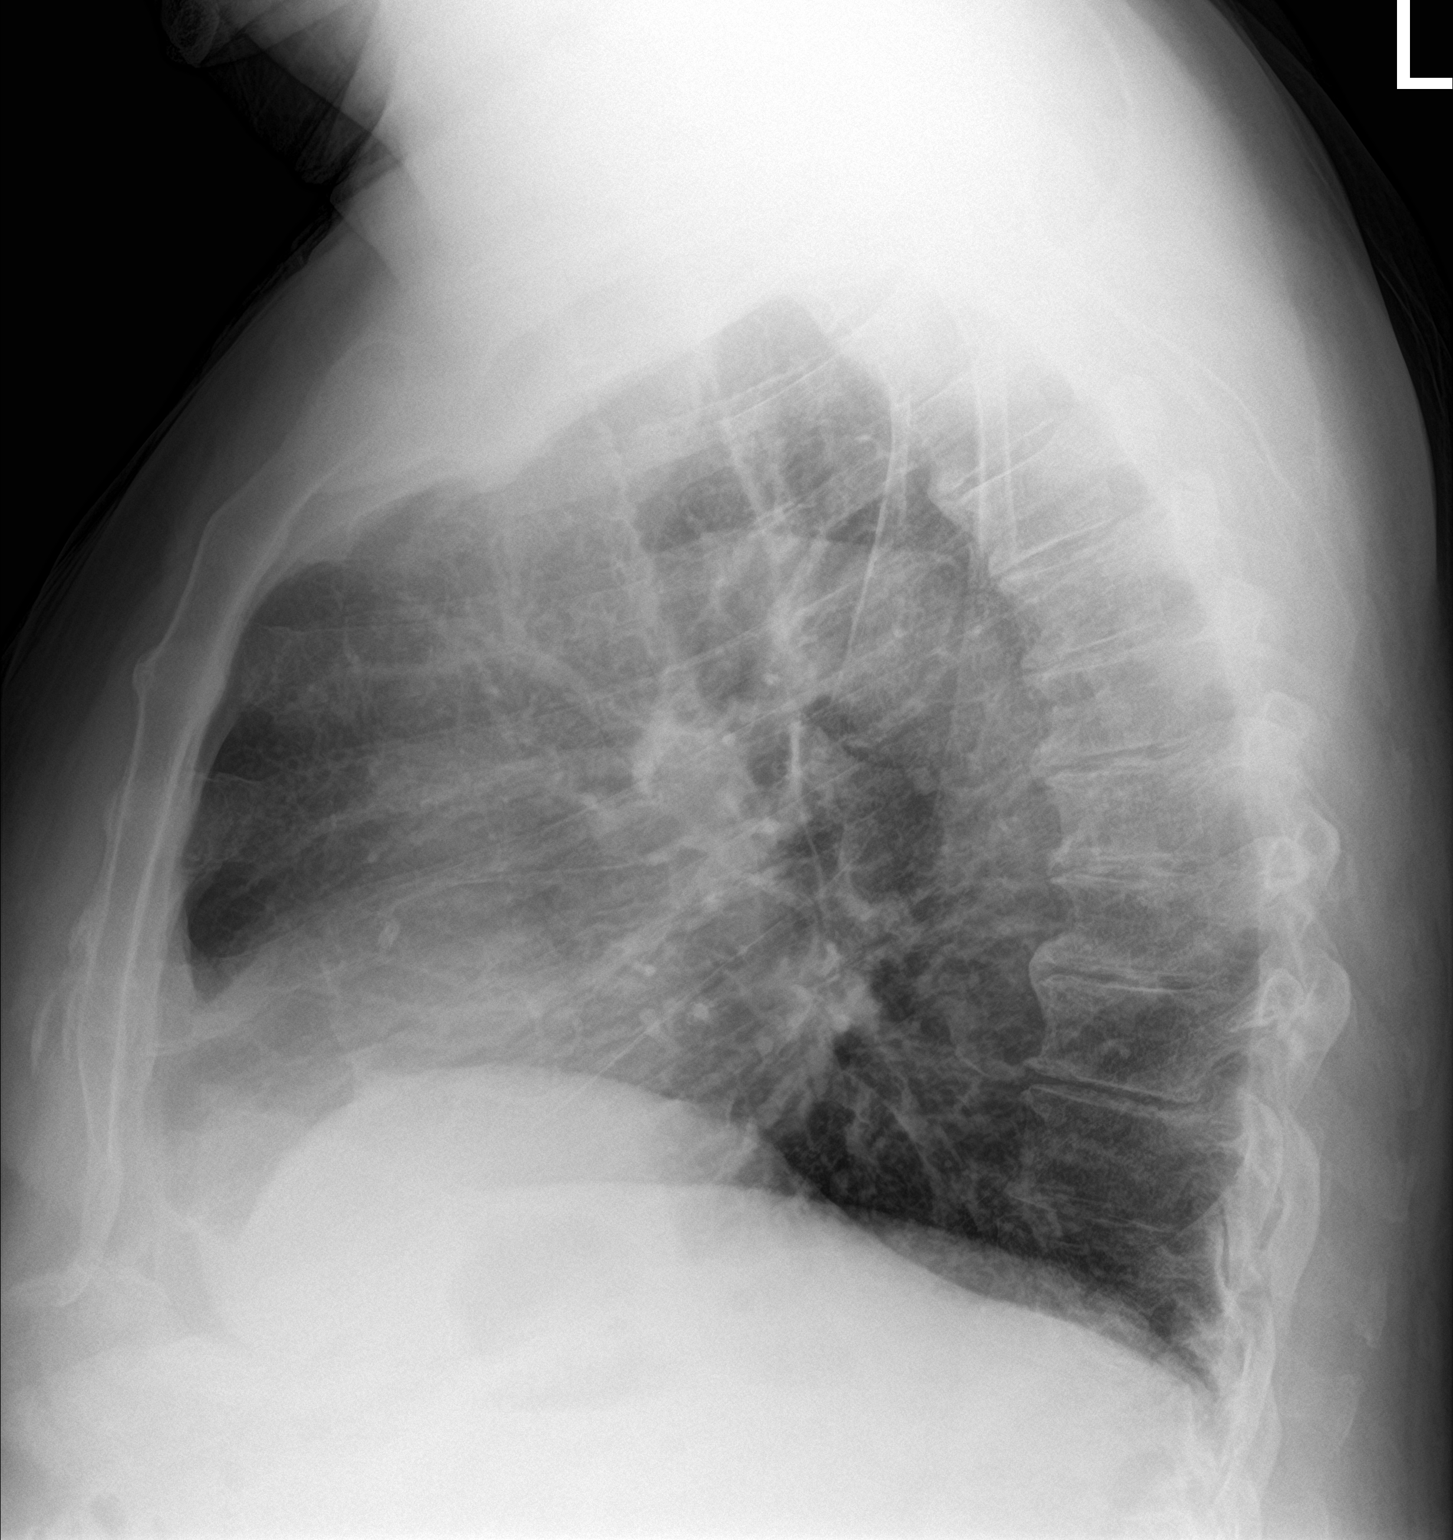

[2 of 2 positions shown; findings below may reference images not displayed]

FINDINGS: Cardiac silhouette is normal in size and configuration. No
mediastinal or hilar masses or convincing adenopathy.

Minor reticular scarring at the lung bases. Lungs otherwise clear.
No pleural effusion or pneumothorax.

Bony thorax is demineralized but intact.
IMPRESSION: No acute cardiopulmonary disease.

## 2017-05-12 DIAGNOSIS — E7849 Other hyperlipidemia: Secondary | ICD-10-CM | POA: Diagnosis not present

## 2017-05-12 DIAGNOSIS — Z6838 Body mass index (BMI) 38.0-38.9, adult: Secondary | ICD-10-CM | POA: Diagnosis not present

## 2017-05-12 DIAGNOSIS — E1122 Type 2 diabetes mellitus with diabetic chronic kidney disease: Secondary | ICD-10-CM | POA: Diagnosis not present

## 2017-05-12 DIAGNOSIS — I10 Essential (primary) hypertension: Secondary | ICD-10-CM | POA: Diagnosis not present

## 2017-05-12 DIAGNOSIS — E119 Type 2 diabetes mellitus without complications: Secondary | ICD-10-CM | POA: Diagnosis not present

## 2017-05-12 DIAGNOSIS — N183 Chronic kidney disease, stage 3 (moderate): Secondary | ICD-10-CM | POA: Diagnosis not present

## 2017-05-12 DIAGNOSIS — H6123 Impacted cerumen, bilateral: Secondary | ICD-10-CM | POA: Diagnosis not present

## 2017-08-06 DIAGNOSIS — E1151 Type 2 diabetes mellitus with diabetic peripheral angiopathy without gangrene: Secondary | ICD-10-CM | POA: Diagnosis not present

## 2017-08-06 DIAGNOSIS — Z125 Encounter for screening for malignant neoplasm of prostate: Secondary | ICD-10-CM | POA: Diagnosis not present

## 2017-08-06 DIAGNOSIS — E7849 Other hyperlipidemia: Secondary | ICD-10-CM | POA: Diagnosis not present

## 2017-08-06 DIAGNOSIS — R82998 Other abnormal findings in urine: Secondary | ICD-10-CM | POA: Diagnosis not present

## 2017-08-06 DIAGNOSIS — I10 Essential (primary) hypertension: Secondary | ICD-10-CM | POA: Diagnosis not present

## 2017-08-13 DIAGNOSIS — I10 Essential (primary) hypertension: Secondary | ICD-10-CM | POA: Diagnosis not present

## 2017-08-13 DIAGNOSIS — Z1389 Encounter for screening for other disorder: Secondary | ICD-10-CM | POA: Diagnosis not present

## 2017-08-13 DIAGNOSIS — E1122 Type 2 diabetes mellitus with diabetic chronic kidney disease: Secondary | ICD-10-CM | POA: Diagnosis not present

## 2017-08-13 DIAGNOSIS — N400 Enlarged prostate without lower urinary tract symptoms: Secondary | ICD-10-CM | POA: Diagnosis not present

## 2017-08-13 DIAGNOSIS — N183 Chronic kidney disease, stage 3 (moderate): Secondary | ICD-10-CM | POA: Diagnosis not present

## 2017-08-13 DIAGNOSIS — J449 Chronic obstructive pulmonary disease, unspecified: Secondary | ICD-10-CM | POA: Diagnosis not present

## 2017-08-13 DIAGNOSIS — E7849 Other hyperlipidemia: Secondary | ICD-10-CM | POA: Diagnosis not present

## 2017-08-13 DIAGNOSIS — Z6838 Body mass index (BMI) 38.0-38.9, adult: Secondary | ICD-10-CM | POA: Diagnosis not present

## 2017-08-13 DIAGNOSIS — Z0000 Encounter for general adult medical examination without abnormal findings: Secondary | ICD-10-CM | POA: Diagnosis not present

## 2017-08-13 DIAGNOSIS — I25118 Atherosclerotic heart disease of native coronary artery with other forms of angina pectoris: Secondary | ICD-10-CM | POA: Diagnosis not present

## 2017-08-14 DIAGNOSIS — Z1212 Encounter for screening for malignant neoplasm of rectum: Secondary | ICD-10-CM | POA: Diagnosis not present

## 2017-10-24 DIAGNOSIS — H3500 Unspecified background retinopathy: Secondary | ICD-10-CM | POA: Diagnosis not present

## 2017-10-24 DIAGNOSIS — E119 Type 2 diabetes mellitus without complications: Secondary | ICD-10-CM | POA: Diagnosis not present

## 2017-10-24 DIAGNOSIS — H25013 Cortical age-related cataract, bilateral: Secondary | ICD-10-CM | POA: Diagnosis not present

## 2017-10-24 DIAGNOSIS — H524 Presbyopia: Secondary | ICD-10-CM | POA: Diagnosis not present

## 2017-10-29 DIAGNOSIS — M545 Low back pain: Secondary | ICD-10-CM | POA: Diagnosis not present

## 2018-03-11 DIAGNOSIS — E7849 Other hyperlipidemia: Secondary | ICD-10-CM | POA: Diagnosis not present

## 2018-03-11 DIAGNOSIS — G608 Other hereditary and idiopathic neuropathies: Secondary | ICD-10-CM | POA: Diagnosis not present

## 2018-03-11 DIAGNOSIS — I25118 Atherosclerotic heart disease of native coronary artery with other forms of angina pectoris: Secondary | ICD-10-CM | POA: Diagnosis not present

## 2018-03-11 DIAGNOSIS — I10 Essential (primary) hypertension: Secondary | ICD-10-CM | POA: Diagnosis not present

## 2018-03-11 DIAGNOSIS — N183 Chronic kidney disease, stage 3 (moderate): Secondary | ICD-10-CM | POA: Diagnosis not present

## 2018-03-11 DIAGNOSIS — E1151 Type 2 diabetes mellitus with diabetic peripheral angiopathy without gangrene: Secondary | ICD-10-CM | POA: Diagnosis not present

## 2018-03-11 DIAGNOSIS — J449 Chronic obstructive pulmonary disease, unspecified: Secondary | ICD-10-CM | POA: Diagnosis not present

## 2018-03-11 DIAGNOSIS — E1140 Type 2 diabetes mellitus with diabetic neuropathy, unspecified: Secondary | ICD-10-CM | POA: Diagnosis not present

## 2018-03-11 DIAGNOSIS — Z6836 Body mass index (BMI) 36.0-36.9, adult: Secondary | ICD-10-CM | POA: Diagnosis not present

## 2018-03-11 DIAGNOSIS — I130 Hypertensive heart and chronic kidney disease with heart failure and stage 1 through stage 4 chronic kidney disease, or unspecified chronic kidney disease: Secondary | ICD-10-CM | POA: Diagnosis not present

## 2018-04-08 DIAGNOSIS — R0609 Other forms of dyspnea: Secondary | ICD-10-CM | POA: Diagnosis not present

## 2018-04-08 DIAGNOSIS — R0989 Other specified symptoms and signs involving the circulatory and respiratory systems: Secondary | ICD-10-CM | POA: Diagnosis not present

## 2018-04-08 DIAGNOSIS — I10 Essential (primary) hypertension: Secondary | ICD-10-CM | POA: Diagnosis not present

## 2018-04-08 DIAGNOSIS — I2510 Atherosclerotic heart disease of native coronary artery without angina pectoris: Secondary | ICD-10-CM | POA: Diagnosis not present

## 2018-05-14 DIAGNOSIS — R69 Illness, unspecified: Secondary | ICD-10-CM | POA: Diagnosis not present

## 2018-05-27 DIAGNOSIS — R69 Illness, unspecified: Secondary | ICD-10-CM | POA: Diagnosis not present

## 2018-08-17 ENCOUNTER — Other Ambulatory Visit: Payer: Self-pay

## 2018-08-17 MED ORDER — AMLODIPINE BESYLATE 5 MG PO TABS: 2.5000 mg | ORAL_TABLET | Freq: Every day | ORAL | 2 refills | Status: DC

## 2018-08-17 MED ORDER — VALSARTAN-HYDROCHLOROTHIAZIDE 160-25 MG PO TABS: 1.0000 | ORAL_TABLET | Freq: Every day | ORAL | 2 refills | Status: DC

## 2018-08-25 ENCOUNTER — Other Ambulatory Visit: Payer: Self-pay | Admitting: Internal Medicine

## 2018-08-25 DIAGNOSIS — Z87891 Personal history of nicotine dependence: Secondary | ICD-10-CM

## 2018-08-25 DIAGNOSIS — Z0000 Encounter for general adult medical examination without abnormal findings: Secondary | ICD-10-CM

## 2018-09-01 ENCOUNTER — Ambulatory Visit
Admission: RE | Admit: 2018-09-01 | Discharge: 2018-09-01 | Disposition: A | Discharge status: Home / Self Care | Payer: Medicare Other | Source: Ambulatory Visit | Attending: Internal Medicine | Admitting: Internal Medicine

## 2018-09-01 DIAGNOSIS — Z0000 Encounter for general adult medical examination without abnormal findings: Secondary | ICD-10-CM

## 2018-09-01 DIAGNOSIS — Z87891 Personal history of nicotine dependence: Secondary | ICD-10-CM

## 2018-11-02 ENCOUNTER — Other Ambulatory Visit: Payer: Self-pay

## 2018-11-02 MED ORDER — VALSARTAN-HYDROCHLOROTHIAZIDE 320-25 MG PO TABS: 1.0000 | ORAL_TABLET | Freq: Every day | ORAL | Status: DC

## 2018-11-02 MED ORDER — AMLODIPINE BESYLATE 5 MG PO TABS: 5.0000 mg | ORAL_TABLET | Freq: Every day | ORAL | 1 refills | Status: DC

## 2018-11-04 ENCOUNTER — Other Ambulatory Visit: Payer: Self-pay | Admitting: Cardiology

## 2018-11-04 MED ORDER — AMLODIPINE BESYLATE 5 MG PO TABS: 5.0000 mg | ORAL_TABLET | Freq: Every day | ORAL | 3 refills | Status: DC

## 2019-03-15 ENCOUNTER — Other Ambulatory Visit: Payer: Self-pay

## 2019-03-15 MED ORDER — VALSARTAN-HYDROCHLOROTHIAZIDE 320-25 MG PO TABS: 1.0000 | ORAL_TABLET | Freq: Every day | ORAL | 0 refills | Status: DC

## 2019-03-16 ENCOUNTER — Other Ambulatory Visit: Payer: Self-pay

## 2019-03-16 MED ORDER — VALSARTAN-HYDROCHLOROTHIAZIDE 320-25 MG PO TABS: 1.0000 | ORAL_TABLET | Freq: Every day | ORAL | 0 refills | Status: DC

## 2019-03-18 ENCOUNTER — Encounter (HOSPITAL_COMMUNITY): Payer: Self-pay | Admitting: *Deleted

## 2019-03-18 ENCOUNTER — Inpatient Hospital Stay (HOSPITAL_COMMUNITY)
Admission: EM | Admit: 2019-03-18 | Discharge: 2019-03-19 | DRG: 282 | Disposition: A | Discharge status: Home / Self Care | Payer: Medicare Other | Attending: Cardiology | Admitting: Cardiology

## 2019-03-18 ENCOUNTER — Emergency Department (HOSPITAL_COMMUNITY): Payer: Medicare Other

## 2019-03-18 ENCOUNTER — Other Ambulatory Visit: Payer: Self-pay

## 2019-03-18 DIAGNOSIS — E7800 Pure hypercholesterolemia, unspecified: Secondary | ICD-10-CM | POA: Diagnosis present

## 2019-03-18 DIAGNOSIS — E876 Hypokalemia: Secondary | ICD-10-CM | POA: Diagnosis not present

## 2019-03-18 DIAGNOSIS — I10 Essential (primary) hypertension: Secondary | ICD-10-CM | POA: Diagnosis present

## 2019-03-18 DIAGNOSIS — Z7982 Long term (current) use of aspirin: Secondary | ICD-10-CM

## 2019-03-18 DIAGNOSIS — Z96659 Presence of unspecified artificial knee joint: Secondary | ICD-10-CM | POA: Diagnosis present

## 2019-03-18 DIAGNOSIS — Z8349 Family history of other endocrine, nutritional and metabolic diseases: Secondary | ICD-10-CM | POA: Diagnosis not present

## 2019-03-18 DIAGNOSIS — Z8249 Family history of ischemic heart disease and other diseases of the circulatory system: Secondary | ICD-10-CM

## 2019-03-18 DIAGNOSIS — G4730 Sleep apnea, unspecified: Secondary | ICD-10-CM | POA: Diagnosis present

## 2019-03-18 DIAGNOSIS — I252 Old myocardial infarction: Secondary | ICD-10-CM

## 2019-03-18 DIAGNOSIS — I25110 Atherosclerotic heart disease of native coronary artery with unstable angina pectoris: Secondary | ICD-10-CM | POA: Diagnosis present

## 2019-03-18 DIAGNOSIS — Z955 Presence of coronary angioplasty implant and graft: Secondary | ICD-10-CM

## 2019-03-18 DIAGNOSIS — J449 Chronic obstructive pulmonary disease, unspecified: Secondary | ICD-10-CM | POA: Diagnosis present

## 2019-03-18 DIAGNOSIS — Z1211 Encounter for screening for malignant neoplasm of colon: Secondary | ICD-10-CM | POA: Diagnosis not present

## 2019-03-18 DIAGNOSIS — Z20828 Contact with and (suspected) exposure to other viral communicable diseases: Secondary | ICD-10-CM | POA: Diagnosis present

## 2019-03-18 DIAGNOSIS — Z87891 Personal history of nicotine dependence: Secondary | ICD-10-CM | POA: Diagnosis not present

## 2019-03-18 DIAGNOSIS — E785 Hyperlipidemia, unspecified: Secondary | ICD-10-CM | POA: Diagnosis present

## 2019-03-18 DIAGNOSIS — Z833 Family history of diabetes mellitus: Secondary | ICD-10-CM

## 2019-03-18 DIAGNOSIS — Z823 Family history of stroke: Secondary | ICD-10-CM | POA: Diagnosis not present

## 2019-03-18 DIAGNOSIS — E119 Type 2 diabetes mellitus without complications: Secondary | ICD-10-CM | POA: Diagnosis present

## 2019-03-18 DIAGNOSIS — I214 Non-ST elevation (NSTEMI) myocardial infarction: Secondary | ICD-10-CM | POA: Diagnosis present

## 2019-03-18 DIAGNOSIS — R079 Chest pain, unspecified: Secondary | ICD-10-CM

## 2019-03-18 LAB — CBC
HCT: 46.2 % (ref 39.0–52.0)
Hemoglobin: 14.8 g/dL (ref 13.0–17.0)
MCH: 28.3 pg (ref 26.0–34.0)
MCHC: 32 g/dL (ref 30.0–36.0)
MCV: 88.3 fL (ref 80.0–100.0)
Platelets: 184 10*3/uL (ref 150–400)
RBC: 5.23 MIL/uL (ref 4.22–5.81)
RDW: 13.2 % (ref 11.5–15.5)
WBC: 7.5 10*3/uL (ref 4.0–10.5)
nRBC: 0 % (ref 0.0–0.2)

## 2019-03-18 LAB — BASIC METABOLIC PANEL
Anion gap: 14 (ref 5–15)
BUN: 11 mg/dL (ref 8–23)
CO2: 27 mmol/L (ref 22–32)
Calcium: 9.3 mg/dL (ref 8.9–10.3)
Chloride: 98 mmol/L (ref 98–111)
Creatinine, Ser: 1.43 mg/dL — ABNORMAL HIGH (ref 0.61–1.24)
GFR calc Af Amer: 56 mL/min — ABNORMAL LOW (ref >60)
GFR calc non Af Amer: 48 mL/min — ABNORMAL LOW (ref >60)
Glucose, Bld: 115 mg/dL — ABNORMAL HIGH (ref 70–99)
Potassium: 3 mmol/L — ABNORMAL LOW (ref 3.5–5.1)
Sodium: 139 mmol/L (ref 135–145)

## 2019-03-18 LAB — MAGNESIUM: Magnesium: 2 mg/dL (ref 1.7–2.4)

## 2019-03-18 LAB — TROPONIN I (HIGH SENSITIVITY)
Troponin I (High Sensitivity): 22 ng/L — ABNORMAL HIGH (ref <18)
Troponin I (High Sensitivity): 25 ng/L — ABNORMAL HIGH (ref <18)

## 2019-03-18 LAB — CBG MONITORING, ED: Glucose-Capillary: 111 mg/dL — ABNORMAL HIGH (ref 70–99)

## 2019-03-18 LAB — HEMOGLOBIN A1C
Hgb A1c MFr Bld: 6 % — ABNORMAL HIGH (ref 4.8–5.6)
Mean Plasma Glucose: 125.5 mg/dL

## 2019-03-18 MED ORDER — LABETALOL HCL 5 MG/ML IV SOLN: 10.0000 mg | Freq: Four times a day (QID) | INTRAVENOUS | Status: DC | PRN

## 2019-03-18 MED ORDER — ASPIRIN 300 MG RE SUPP: 300.0000 mg | RECTAL | Status: AC

## 2019-03-18 MED ORDER — SODIUM CHLORIDE 0.9% FLUSH: 3.0000 mL | Freq: Two times a day (BID) | INTRAVENOUS | Status: DC

## 2019-03-18 MED ORDER — NITROGLYCERIN IN D5W 200-5 MCG/ML-% IV SOLN
2.0000 ug/min | INTRAVENOUS | Status: DC
  Administered 2019-03-18: 5 ug/min via INTRAVENOUS
  Filled 2019-03-18: qty 250

## 2019-03-18 MED ORDER — DEXTROSE 50 % IV SOLN
12.5000 g | INTRAVENOUS | Status: AC
  Filled 2019-03-18: qty 50

## 2019-03-18 MED ORDER — HYDRALAZINE HCL 25 MG PO TABS
100.0000 mg | ORAL_TABLET | Freq: Two times a day (BID) | ORAL | Status: DC
  Administered 2019-03-18 – 2019-03-19 (×3): 100 mg via ORAL
  Filled 2019-03-18 (×3): qty 4

## 2019-03-18 MED ORDER — ASPIRIN 81 MG PO TABS: 81.0000 mg | ORAL_TABLET | Freq: Every day | ORAL | Status: DC

## 2019-03-18 MED ORDER — POTASSIUM CHLORIDE CRYS ER 20 MEQ PO TBCR
40.0000 meq | EXTENDED_RELEASE_TABLET | Freq: Once | ORAL | Status: AC
  Administered 2019-03-18: 40 meq via ORAL
  Filled 2019-03-18: qty 2

## 2019-03-18 MED ORDER — SODIUM CHLORIDE 0.9 % IV SOLN: 250.0000 mL | INTRAVENOUS | Status: DC | PRN

## 2019-03-18 MED ORDER — ASPIRIN 81 MG PO CHEW
81.0000 mg | CHEWABLE_TABLET | ORAL | Status: AC
  Administered 2019-03-19: 81 mg via ORAL
  Filled 2019-03-18: qty 1

## 2019-03-18 MED ORDER — ASPIRIN 81 MG PO CHEW
324.0000 mg | CHEWABLE_TABLET | ORAL | Status: AC
  Administered 2019-03-18: 324 mg via ORAL
  Filled 2019-03-18: qty 4

## 2019-03-18 MED ORDER — POTASSIUM CHLORIDE 10 MEQ/100ML IV SOLN
10.0000 meq | INTRAVENOUS | Status: AC
  Administered 2019-03-18 – 2019-03-19 (×4): 10 meq via INTRAVENOUS
  Filled 2019-03-18 (×4): qty 100

## 2019-03-18 MED ORDER — NITROGLYCERIN 0.4 MG SL SUBL: 0.4000 mg | SUBLINGUAL_TABLET | SUBLINGUAL | Status: DC | PRN

## 2019-03-18 MED ORDER — ASPIRIN EC 81 MG PO TBEC: 81.0000 mg | DELAYED_RELEASE_TABLET | Freq: Every day | ORAL | Status: DC

## 2019-03-18 MED ORDER — HEPARIN BOLUS VIA INFUSION
4000.0000 [IU] | Freq: Once | INTRAVENOUS | Status: AC
  Administered 2019-03-18: 4000 [IU] via INTRAVENOUS
  Filled 2019-03-18: qty 4000

## 2019-03-18 MED ORDER — ATORVASTATIN CALCIUM 80 MG PO TABS
80.0000 mg | ORAL_TABLET | Freq: Every day | ORAL | Status: DC
  Administered 2019-03-19: 09:00:00 80 mg via ORAL
  Filled 2019-03-18: qty 1

## 2019-03-18 MED ORDER — SODIUM CHLORIDE 0.9% FLUSH: 3.0000 mL | Freq: Once | INTRAVENOUS | Status: DC

## 2019-03-18 MED ORDER — AMLODIPINE BESYLATE 5 MG PO TABS
5.0000 mg | ORAL_TABLET | Freq: Every day | ORAL | Status: DC
  Administered 2019-03-19: 09:00:00 5 mg via ORAL
  Filled 2019-03-18: qty 1

## 2019-03-18 MED ORDER — SODIUM CHLORIDE 0.9 % WEIGHT BASED INFUSION
1.0000 mL/kg/h | INTRAVENOUS | Status: DC
  Administered 2019-03-19: 1 mL/kg/h via INTRAVENOUS

## 2019-03-18 MED ORDER — SODIUM CHLORIDE 0.9 % WEIGHT BASED INFUSION
3.0000 mL/kg/h | INTRAVENOUS | Status: AC
  Administered 2019-03-19: 3 mL/kg/h via INTRAVENOUS

## 2019-03-18 MED ORDER — SODIUM CHLORIDE 0.9% FLUSH: 3.0000 mL | INTRAVENOUS | Status: DC | PRN

## 2019-03-18 MED ORDER — ACETAMINOPHEN 325 MG PO TABS: 650.0000 mg | ORAL_TABLET | ORAL | Status: DC | PRN

## 2019-03-18 MED ORDER — TAMSULOSIN HCL 0.4 MG PO CAPS
0.4000 mg | ORAL_CAPSULE | Freq: Every day | ORAL | Status: DC
  Administered 2019-03-19: 0.4 mg via ORAL
  Filled 2019-03-18: qty 1

## 2019-03-18 MED ORDER — ONDANSETRON HCL 4 MG/2ML IJ SOLN: 4.0000 mg | Freq: Four times a day (QID) | INTRAMUSCULAR | Status: DC | PRN

## 2019-03-18 MED ORDER — INSULIN ASPART 100 UNIT/ML ~~LOC~~ SOLN: 0.0000 [IU] | Freq: Three times a day (TID) | SUBCUTANEOUS | Status: DC

## 2019-03-18 MED ORDER — CARVEDILOL 25 MG PO TABS
25.0000 mg | ORAL_TABLET | Freq: Two times a day (BID) | ORAL | Status: DC
  Administered 2019-03-18 – 2019-03-19 (×3): 25 mg via ORAL
  Filled 2019-03-18 (×3): qty 1

## 2019-03-18 MED ORDER — HEPARIN (PORCINE) 25000 UT/250ML-% IV SOLN
1400.0000 [IU]/h | INTRAVENOUS | Status: DC
  Administered 2019-03-18: 1400 [IU]/h via INTRAVENOUS
  Filled 2019-03-18: qty 250

## 2019-03-18 NOTE — ED Provider Notes (Signed)
[facility] Provider Note   CSN: [id number] Arrival date & time: 03/18/19  1311     History   Chief Complaint Chief Complaint  Patient presents with  . Chest Pain  . Palpitations    HPI Kristen Bushway is a 73 y.o. male.  Past medical history of coronary artery disease s/p stents many years ago per patient.  Present emergency room chief complaint chest pain.  Patient states having intermittent episodes of chest tightness, up to 5 out of 10 in severity, currently 2 out of 10 in severity, states mild discomfort, no alleviating or aggravating factors.  No associated shortness of breath or back pain.  Has not taken any medication for this.  States he normally does not have chest pain.  Cardiologist is Dr. Einar Gip.     HPI  Past Medical History:  Diagnosis Date  . CAD (coronary artery disease)   . COPD (chronic obstructive pulmonary disease) ([place])   . Diabetes mellitus   . Heart attack ([place])   . Hyperlipidemia   . Hypertension   . Neuromuscular disorder ([place])   . Sleep apnea   . SOB (shortness of breath) on exertion     Patient Active Problem List   Diagnosis Date Noted  . Pain of left hip joint 09/18/2016  . Unilateral primary osteoarthritis, left hip 08/21/2016  . Unilateral primary osteoarthritis, right hip 08/21/2016  . COPD mixed type ([place]) 05/03/2015  . DOE (dyspnea on exertion) 09/23/2012  . COPD (chronic obstructive pulmonary disease) with emphysema ([place]) 09/23/2012  . TOBACCO ABUSE 08/08/2009  . HYPERCHOLESTEROLEMIA  IIA 08/07/2009  . Overweight 08/07/2009  . Essential hypertension 08/07/2009  . CAD, NATIVE VESSEL 08/07/2009  . PALPITATIONS 08/07/2009    Past Surgical History:  Procedure Laterality Date  . BACK SURGERY    . CORONARY STENT PLACEMENT    . KNEE ARTHROPLASTY    . LEFT HEART CATHETERIZATION WITH CORONARY ANGIOGRAM N/A 03/03/2012   Procedure: LEFT HEART CATHETERIZATION WITH CORONARY ANGIOGRAM;  Surgeon:  Laverda Page, MD;  Location: [facility] CATH LAB;  Service: Cardiovascular;  Laterality: N/A;  . PERCUTANEOUS CORONARY STENT INTERVENTION (PCI-S)  03/03/2012   Procedure: PERCUTANEOUS CORONARY STENT INTERVENTION (PCI-S);  Surgeon: Laverda Page, MD;  Location: [facility] CATH LAB;  Service: Cardiovascular;;        Home Medications    Prior to Admission medications   Medication Sig Start Date End Date Taking? Authorizing Provider  amLODipine (NORVASC) 5 MG tablet Take 1 tablet (5 mg total) by mouth daily. 11/04/18   Miquel Dunn, NP  aspirin 81 MG tablet Take 81 mg by mouth daily.     [provider]  atorvastatin (LIPITOR) 10 MG tablet Take 10 mg by mouth daily.     [provider]  budesonide (PULMICORT) 0.25 MG/2ML nebulizer solution Take 2 mLs (0.25 mg total) by nebulization 2 (two) times daily. DX: J44.9. Patient not taking: Reported on 10/09/2016 10/31/15   Noralee Space, MD  carvedilol (COREG) 25 MG tablet Take 50 mg by mouth daily.  03/04/12   Adrian Prows, MD  hydrALAZINE (APRESOLINE) 100 MG tablet  10/06/16   [provider]  ipratropium-albuterol (DUONEB) 0.5-2.5 (3) MG/3ML SOLN Take 3 mLs by nebulization 2 (two) times daily. DX: J44.9 10/31/15   Noralee Space, MD  metFORMIN (GLUCOPHAGE) 500 MG tablet Take 500 mg by mouth daily.    [provider]  Tamsulosin HCl (FLOMAX) 0.4 MG CAPS Take 0.4 mg by mouth  daily.    [provider]  valsartan-hydrochlorothiazide (DIOVAN-HCT) 320-25 MG tablet Take 1 tablet by mouth daily. 03/16/19   Adrian Prows, MD    Family History Family History  Problem Relation Age of Onset  . Hypertension Father   . Hyperlipidemia Father   . Stroke Father   . Hypertension Paternal Grandfather   . Hyperlipidemia Paternal Grandfather   . Diabetes Other        maternal uncles  . Diabetes Sister   . Colon cancer Neg Hx     Social History Social History   Tobacco Use  . Smoking status: Former Smoker    Packs/day:  1.00    Years: 30.00    Pack years: 30.00    Types: Cigarettes    Quit date: 07/30/2003    Years since quitting: 15.6  . Smokeless tobacco: Never Used  Substance Use Topics  . Alcohol use: Yes    Alcohol/week: 0.0 standard drinks    Comment: social  . Drug use: No     Allergies   Patient has no known allergies.   Review of Systems Review of Systems  Constitutional: Negative for chills and fever.  HENT: Negative for ear pain and sore throat.   Eyes: Negative for pain and visual disturbance.  Respiratory: Positive for chest tightness. Negative for cough and shortness of breath.   Cardiovascular: Positive for chest pain. Negative for palpitations.  Gastrointestinal: Negative for abdominal pain and vomiting.  Genitourinary: Negative for dysuria and hematuria.  Musculoskeletal: Negative for arthralgias and back pain.  Skin: Negative for color change and rash.  Neurological: Negative for seizures and syncope.  All other systems reviewed and are negative.    Physical Exam Updated Vital Signs BP 133/88   Pulse 62   Temp 98.1 F (36.7 C) (Oral)   Resp 16   SpO2 98%   Physical Exam Vitals signs and nursing note reviewed.  Constitutional:      Appearance: He is well-developed.  HENT:     Head: Normocephalic and atraumatic.  Eyes:     Conjunctiva/sclera: Conjunctivae normal.  Neck:     Musculoskeletal: Neck supple.  Cardiovascular:     Rate and Rhythm: Normal rate and regular rhythm.     Heart sounds: No murmur.  Pulmonary:     Effort: Pulmonary effort is normal. No respiratory distress.     Breath sounds: Normal breath sounds.  Abdominal:     Palpations: Abdomen is soft.     Tenderness: There is no abdominal tenderness.  Skin:    General: Skin is warm and dry.     Capillary Refill: Capillary refill takes less than 2 seconds.  Neurological:     General: No focal deficit present.     Mental Status: He is alert.      ED Treatments / Results  Labs (all labs  ordered are listed, but only abnormal results are displayed) Labs Reviewed  BASIC METABOLIC PANEL - Abnormal; Notable for the following components:      Result Value   Potassium 3.0 (*)    Glucose, Bld 115 (*)    Creatinine, Ser 1.43 (*)    GFR calc non Af Amer 48 (*)    GFR calc Af Amer 56 (*)    All other components within normal limits  TROPONIN I (HIGH SENSITIVITY) - Abnormal; Notable for the following components:   Troponin I (High Sensitivity) 22 (*)    All other components within normal limits  CBC  TROPONIN I (  HIGH SENSITIVITY)    EKG EKG Interpretation  Date/Time:  Thursday March 18 2019 13:25:24 EDT Ventricular Rate:  80 PR Interval:  194 QRS Duration: 74 QT Interval:  412 QTC Calculation: 475 R Axis:   29 Text Interpretation:  Critical Test Result: Arrhythmia Sinus rhythm with frequent and consecutive Premature ventricular complexes Low voltage QRS Cannot rule out Anterior infarct , age undetermined Abnormal ECG No significant change since last tracing Confirmed by Merrily Pew [phone]) on 03/18/2019 3:14:50 PM   Radiology Dg Chest 2 View  Result Date: 03/18/2019 CLINICAL DATA:  Chest pain EXAM: CHEST - 2 VIEW COMPARISON:  May 03, 2015 FINDINGS: There is mild cardiomegaly. Elevation of the right hemidiaphragm is seen. The lungs are clear. No focal airspace consolidation or pleural effusion. No acute osseous abnormality. IMPRESSION: No acute cardiopulmonary process. Electronically Signed   By: Prudencio Pair M.D.   On: 03/18/2019 14:12    Procedures .Critical Care Performed by: Lucrezia Starch, MD Authorized by: Lucrezia Starch, MD   Critical care provider statement:    Critical care time (minutes):  35   Critical care was necessary to treat or prevent imminent or life-threatening deterioration of the following conditions:  Cardiac failure   Critical care was time spent personally by me on the following activities:  Discussions with consultants,  evaluation of patient's response to treatment, examination of patient, ordering and performing treatments and interventions, ordering and review of laboratory studies, ordering and review of radiographic studies, pulse oximetry, re-evaluation of patient's condition, obtaining history from patient or surrogate and review of old charts   (including critical care time)  Medications Ordered in ED Medications  sodium chloride flush (NS) 0.9 % injection 3 mL (3 mLs Intravenous Not Given 03/18/19 2107)  aspirin EC tablet 81 mg (has no administration in time range)  nitroGLYCERIN (NITROSTAT) SL tablet 0.4 mg (has no administration in time range)  acetaminophen (TYLENOL) tablet 650 mg (has no administration in time range)  ondansetron (ZOFRAN) injection 4 mg (has no administration in time range)  dextrose 50 % solution 12.5 g (0 g Intravenous Hold 03/18/19 2118)  insulin aspart (novoLOG) injection 0-15 Units (has no administration in time range)  amLODipine (NORVASC) tablet 5 mg (has no administration in time range)  atorvastatin (LIPITOR) tablet 80 mg (has no administration in time range)  carvedilol (COREG) tablet 25 mg (25 mg Oral Given 03/18/19 2239)  hydrALAZINE (APRESOLINE) tablet 100 mg (100 mg Oral Given 03/18/19 2239)  tamsulosin (FLOMAX) capsule 0.4 mg (has no administration in time range)  labetalol (NORMODYNE) injection 10 mg (has no administration in time range)  nitroGLYCERIN 50 mg in dextrose 5 % 250 mL (0.2 mg/mL) infusion (10 mcg/min Intravenous Rate/Dose Change 03/18/19 2158)  sodium chloride flush (NS) 0.9 % injection 3 mL (3 mLs Intravenous Not Given 03/18/19 2248)  sodium chloride flush (NS) 0.9 % injection 3 mL (has no administration in time range)  0.9 %  sodium chloride infusion (has no administration in time range)  aspirin chewable tablet 81 mg (has no administration in time range)  0.9% sodium chloride infusion (has no administration in time range)    Followed by  0.9% sodium  chloride infusion (has no administration in time range)  heparin ADULT infusion 100 units/mL (25000 units/260mL sodium chloride 0.45%) (1,400 Units/hr Intravenous New Bag/Given 03/18/19 2105)  potassium chloride 10 mEq in 100 mL IVPB (has no administration in time range)  aspirin chewable tablet 324 mg (324 mg Oral Given 03/18/19 2044)  Or  aspirin suppository 300 mg ( Rectal See Alternative 03/18/19 2044)  heparin bolus via infusion 4,000 Units (4,000 Units Intravenous Bolus from Bag 03/18/19 2105)  potassium chloride SA (K-DUR) CR tablet 40 mEq (40 mEq Oral Given 03/18/19 2333)     Initial Impression / Assessment and Plan / ED Course  I have reviewed the triage vital signs and the nursing notes.  Pertinent labs & imaging results that were available during my care of the patient were reviewed by me and considered in my medical decision making (see chart for details).        73 year old male past medical history of coronary artery disease presents the ER with new onset chest tightness.  On exam patient was well-appearing with normal vital signs, noted mild ongoing chest pain.  EKG without a clear ischemic change however high-sensitivity troponin was mildly elevated.  I discussed case with cardiology who will admit patient.  Patient will receive nitro, aspirin, heparin per ACS protocol and further managed observation by cardiology.  Dr. Virgina Jock accepting.  Final Clinical Impressions(s) / ED Diagnoses   Final diagnoses:  Chest pain, unspecified type  NSTEMI (non-ST elevated myocardial infarction) [facility])    ED Discharge Orders    None       Lucrezia Starch, MD 03/18/19 2338

## 2019-03-18 NOTE — H&P (Signed)
Gregory [name] is an 73 y.o. male.   Chief Complaint: Chest pain HPI:   73 y.o. African-American male  with hypertension, type 2 diabetes mellitus, hyperlipidemia, former smoker, coronary artery disease status post prior PCI, admitted with chest pain and palpitations.  Patient has been doing well until early 03/18/2019 morning 1 AM when he woke up from sleep with chest tightness.  He also noticed his blood pressure and heart rate were elevated more than usual.  Chest tightness continued off and on throughout the day.  Eventually, came to the ED.  Troponin high-sensitivity elevated at 22.  Patient currently has 3/10 chest tightness compared to 6/10 during the day.  He appears comfortable and denies any shortness of breath.  Of note, patient has presented with dyspnea with his previous CAD.  He has not had chest pain symptoms are quite he is currently having.  He endorses leg swelling.  He has been feeling tired and fatigued lately with exertion without any shortness of breath.  Denies any presyncope, syncope, orthopnea, PND.  Past Medical History:  Diagnosis Date  . CAD (coronary artery disease)   . COPD (chronic obstructive pulmonary disease) ([place])   . Diabetes mellitus   . Heart attack ([place])   . Hyperlipidemia   . Hypertension   . Neuromuscular disorder ([place])   . Sleep apnea   . SOB (shortness of breath) on exertion     Past Surgical History:  Procedure Laterality Date  . BACK SURGERY    . CORONARY STENT PLACEMENT    . KNEE ARTHROPLASTY    . LEFT HEART CATHETERIZATION WITH CORONARY ANGIOGRAM N/A 03/03/2012   Procedure: LEFT HEART CATHETERIZATION WITH CORONARY ANGIOGRAM;  Surgeon: Laverda Page, MD;  Location[facility] C[facility] LAB;  Service: Cardiovascular;  Laterality: N/A;  . PERCUTANEOUS CORONARY STENT INTERVENTION (PCI-S)  03/03/2012   Procedure: PERCUTANEOUS CORONARY STENT INTERVENTION (PCI-S);  Surgeon: Laverda Page, MD;  Location: [facility] CATH LAB;  Service: Cardiovascular;;    Family  History  Problem Relation Age of Onset  . Hypertension Father   . Hyperlipidemia Father   . Stroke Father   . Hypertension Paternal Grandfather   . Hyperlipidemia Paternal Grandfather   . Diabetes Other        maternal uncles  . Diabetes Sister   . Colon cancer Neg Hx    Social History:  reports that he quit smoking about 15 years ago. His smoking use included cigarettes. He has a 30.00 pack-year smoking history. He has never used smokeless tobacco. He reports current alcohol use. He reports that he does not use drugs.  Allergies: No Known Allergies  Review of Systems  Constitution: Positive for malaise/fatigue. Negative for decreased appetite, weight gain and weight loss.  HENT: Negative for congestion.   Eyes: Negative for visual disturbance.  Cardiovascular: Positive for chest pain and palpitations. Negative for dyspnea on exertion, leg swelling and syncope.  Respiratory: Negative for cough.   Endocrine: Negative for cold intolerance.  Hematologic/Lymphatic: Does not bruise/bleed easily.  Skin: Negative for itching and rash.  Musculoskeletal: Negative for myalgias.  Gastrointestinal: Negative for abdominal pain, nausea and vomiting.  Genitourinary: Negative for dysuria.  Neurological: Negative for dizziness and weakness.  Psychiatric/Behavioral: The patient is not nervous/anxious.   All other systems reviewed and are negative.    Blood pressure (!) 159/96, pulse 64, temperature 98.1 F (36.7 C), temperature source Oral, resp. rate 18, SpO2 98 %. There is no height or weight on file to calculate BMI.  Physical  Exam  Constitutional: He is oriented to person, place, and time. He appears well-developed and well-nourished. No distress.  HENT:  Head: Normocephalic and atraumatic.  Eyes: Pupils are equal, round, and reactive to light. Conjunctivae are normal.  Neck: No JVD present.  Cardiovascular: Normal rate, regular rhythm and intact distal pulses. Frequent extrasystoles are  present.  No murmur heard. Pulmonary/Chest: Effort normal and breath sounds normal. He has no wheezes. He has no rales.  Abdominal: Soft. Bowel sounds are normal. There is no rebound.  Musculoskeletal:        General: Edema (1+ b/l) present.  Lymphadenopathy:    He has no cervical adenopathy.  Neurological: He is alert and oriented to person, place, and time. No cranial nerve deficit.  Skin: Skin is warm and dry. He is not diaphoretic.  Psychiatric: He has a normal mood and affect.  Nursing note and vitals reviewed.   Results for orders placed or performed during the hospital encounter of 03/18/19 (from the past 48 hour(s))  Basic metabolic panel     Status: Abnormal   Collection Time: 03/18/19  1:34 PM  Result Value Ref Range   Sodium 139 135 - 145 mmol/L   Potassium 3.0 (L) 3.5 - 5.1 mmol/L   Chloride 98 98 - 111 mmol/L   CO2 27 22 - 32 mmol/L   Glucose, Bld 115 (H) 70 - 99 mg/dL   BUN 11 8 - 23 mg/dL   Creatinine, Ser 1.43 (H) 0.61 - 1.24 mg/dL   Calcium 9.3 8.9 - 10.3 mg/dL   GFR calc non Af Amer 48 (L) >60 mL/min   GFR calc Af Amer 56 (L) >60 mL/min   Anion gap 14 5 - 15    Comment: Performed at [facility], [address] [place] [address]  CBC     Status: None   Collection Time: 03/18/19  1:34 PM  Result Value Ref Range   WBC 7.5 4.0 - 10.5 K/uL   RBC 5.23 4.22 - 5.81 MIL/uL   Hemoglobin 14.8 13.0 - 17.0 g/dL   HCT 46.2 39.0 - 52.0 %   MCV 88.3 80.0 - 100.0 fL   MCH 28.3 26.0 - 34.0 pg   MCHC 32.0 30.0 - 36.0 g/dL   RDW 13.2 11.5 - 15.5 %   Platelets 184 150 - 400 K/uL   nRBC 0.0 0.0 - 0.2 %    Comment: Performed at [facility], [place] [address] [place], [place] [address]  Troponin I (High Sensitivity)     Status: Abnormal   Collection Time: 03/18/19  1:34 PM  Result Value Ref Range   Troponin I (High Sensitivity) 22 (H) <18 ng/L    Comment: (NOTE) Elevated high sensitivity troponin I (hsTnI) values and significant  changes across serial  measurements may suggest ACS but many other  chronic and acute conditions are known to elevate hsTnI results.  Refer to the "Links" section for chest pain algorithms and additional  guidance. Performed at [facility], [place] [address] [place], [place] [address]   Magnesium     Status: None   Collection Time: 03/18/19  6:55 PM  Result Value Ref Range   Magnesium 2.0 1.7 - 2.4 mg/dL    Comment: Performed at [facility], [place] [address], [place], [place] [address]  Troponin I (High Sensitivity)     Status: Abnormal   Collection Time: 03/18/19  7:00 PM  Result Value Ref Range  Troponin I (High Sensitivity) 25 (H) <18 ng/L    Comment: (NOTE) Elevated high sensitivity troponin I (hsTnI) values and significant  changes across serial measurements may suggest ACS but many other  chronic and acute conditions are known to elevate hsTnI results.  Refer to the "Links" section for chest pain algorithms and additional  guidance. Performed at [facility], [place] [address] [place], [place] [address]     Labs:   Lab Results  Component Value Date   WBC 7.5 03/18/2019   HGB 14.8 03/18/2019   HCT 46.2 03/18/2019   MCV 88.3 03/18/2019   PLT 184 03/18/2019    Recent Labs  Lab 03/18/19 1334  NA 139  K 3.0*  CL 98  CO2 27  BUN 11  CREATININE 1.43*  CALCIUM 9.3  GLUCOSE 115*    Lipid Panel  No results found for: CHOL, TRIG, HDL, CHOLHDL, VLDL, LDLCALC  BNP (last 3 results) No results for input(s): BNP in the last 8760 hours.  HEMOGLOBIN A1C No results found for: HGBA1C, MPG  Cardiac Panel (last 3 results) No results for input(s): CKTOTAL, CKMB, RELINDX in the last 8760 hours.  Invalid input(s): TROPONINHS  No results found for: CKTOTAL, CKMB, CKMBINDEX   TSH No results for input(s): TSH in the last 8760 hours.   (Not in a hospital admission)     Current Facility-Administered Medications:  .  0.9 %  sodium chloride infusion, 500 mL, Intravenous,  Continuous, Pyrtle, [name], MD .  acetaminophen (TYLENOL) tablet 650 mg, 650 mg, Oral, Q4H PRN, Nataly Pacifico J, MD .  Derrill Memo ON 03/19/2019] amLODipine (NORVASC) tablet 5 mg, 5 mg, Oral, Daily, Nayib Remer J, MD .  aspirin chewable tablet 324 mg, 324 mg, Oral, NOW **OR** aspirin suppository 300 mg, 300 mg, Rectal, NOW, Jamey Harman J, MD .  Derrill Memo ON 03/19/2019] aspirin EC tablet 81 mg, 81 mg, Oral, Daily, Abhi Moccia J, MD .  Derrill Memo ON 03/19/2019] aspirin tablet 81 mg, 81 mg, Oral, Daily, Luay Balding J, MD .  atorvastatin (LIPITOR) tablet 80 mg, 80 mg, Oral, Daily, Coty Larsh J, MD .  carvedilol (COREG) tablet 25 mg, 25 mg, Oral, BID WC, Eryanna Regal J, MD .  dextrose 50 % solution 12.5 g, 12.5 g, Intravenous, STAT, Agustine Rossitto J, MD .  hydrALAZINE (APRESOLINE) tablet 100 mg, 100 mg, Oral, BID, Loriel Diehl J, MD .  Derrill Memo ON 03/19/2019] insulin aspart (novoLOG) injection 0-15 Units, 0-15 Units, Subcutaneous, TID WC, Zackery Brine J, MD .  labetalol (NORMODYNE) injection 10 mg, 10 mg, Intravenous, Q6H PRN, Vedanshi Massaro J, MD .  nitroGLYCERIN (NITROSTAT) SL tablet 0.4 mg, 0.4 mg, Sublingual, Q5 Min x 3 PRN, Satin Boal J, MD .  nitroGLYCERIN 50 mg in dextrose 5 % 250 mL (0.2 mg/mL) infusion, 2-200 mcg/min, Intravenous, Titrated, Punam Broussard J, MD .  ondansetron (ZOFRAN) injection 4 mg, 4 mg, Intravenous, Q6H PRN, Kem Parcher J, MD .  sodium chloride flush (NS) 0.9 % injection 3 mL, 3 mL, Intravenous, Once, Dykstra, Richard S, MD .  tamsulosin (FLOMAX) capsule 0.4 mg, 0.4 mg, Oral, Daily, Melainie Krinsky J, MD  Current Outpatient Medications:  .  amLODipine (NORVASC) 5 MG tablet, Take 1 tablet (5 mg total) by mouth daily., Disp: 90 tablet, Rfl: 3 .  aspirin 81 MG tablet, Take 81 mg by mouth daily. , Disp: , Rfl:  .  atorvastatin (LIPITOR) 10 MG tablet, Take 5 mg by mouth daily. , Disp: , Rfl:  .  carvedilol  (COREG) 25 MG tablet, Take 50 mg by mouth daily. , Disp: , Rfl:  .  hydrALAZINE (APRESOLINE) 100 MG tablet, Take 100 mg by mouth 2 (two) times daily. , Disp: , Rfl:  .  metFORMIN (GLUCOPHAGE) 500 MG tablet, Take 500 mg by mouth daily., Disp: , Rfl:  .  Tamsulosin HCl (FLOMAX) 0.4 MG CAPS, Take 0.4 mg by mouth daily., Disp: , Rfl:  .  valsartan-hydrochlorothiazide (DIOVAN-HCT) 160-25 MG tablet, Take 1 tablet by mouth daily., Disp: , Rfl:    Today's Vitals   03/18/19 1641 03/18/19 1852 03/18/19 1930 03/18/19 1945  BP: 133/88 (!) 151/99 (!) 150/89 (!) 159/96  Pulse: 62  63 64  Resp: 16  18 18   Temp: 98.1 F (36.7 C)     TempSrc: Oral     SpO2: 98%  97% 98%  PainSc:       There is no height or weight on file to calculate BMI.  CARDIAC STUDIES:  EKG 03/18/2019: Sinus rhythm. Old anteroseptal infarct. Frequent PVC's  Echocardiogram pending  Coronary angiography 2013: Mild LAD disease. D2 85% stenosis-->XIence 2.5 X 15 mm DES. Patent LCx stents from 2005- Mid LCx 3.5X18 & 3.5X12 mm ZOmax study stent RCA:??  Assessment/Plan  73 y.o. African-American male  with hypertension, type 2 diabetes mellitus, hyperlipidemia, former smoker, coronary artery disease status post prior PCI, admitted with chest pain and palpitations.  Chest pain palpitations. Sinus rhythm with frequent PVCs.  3/10 chest tightness.  Appears comfortable.  High-sensitivity troponin elevated.  Admitted for NSTEMI. Recommend aspirin, heparin drip, Coreg 25 mg twice daily, Lipitor 80 mg daily. Started nitro drip for chest pain control. Continue amlodipine 5 mg daily.  Hold valsartan HCTZ in light of coronary angiography tomorrow. Echocardiogram, lipid panel  Type II DM: Hold metformin Check A1C   Nigel Mormon, MD 03/18/2019, 8:21 PM [facility] Pager: [phone] Office: [phone] If no answer: [phone]

## 2019-03-18 NOTE — Progress Notes (Signed)
ANTICOAGULATION CONSULT NOTE - Initial Consult  Pharmacy Consult for Heparin Indication: chest pain/ACS  No Known Allergies  Patient Measurements: Height: 6' (182.9 cm) Weight: 266 lb (120.7 kg) IBW/kg (Calculated) : 77.6 Heparin Dosing Weight: 104.1 kg  Vital Signs: Temp: 98.1 F (36.7 C) (09/24 1641) Temp Source: Oral (09/24 1641) BP: 159/96 (09/24 1945) Pulse Rate: 64 (09/24 1945)  Labs: Recent Labs    03/18/19 1334 03/18/19 1900  HGB 14.8  --   HCT 46.2  --   PLT 184  --   CREATININE 1.43*  --   TROPONINIHS 22* 25*    Estimated Creatinine Clearance: 60.8 mL/min (A) (by C-G formula based on SCr of 1.43 mg/dL (H)).   Medical History: Past Medical History:  Diagnosis Date  . CAD (coronary artery disease)   . COPD (chronic obstructive pulmonary disease) ([place])   . Diabetes mellitus   . Heart attack ([place])   . Hyperlipidemia   . Hypertension   . Neuromuscular disorder ([place])   . Sleep apnea   . SOB (shortness of breath) on exertion     Medications:  Scheduled:  . [START ON 03/19/2019] amLODipine  5 mg Oral Daily  . aspirin  324 mg Oral NOW   Or  . aspirin  300 mg Rectal NOW  . [START ON 03/19/2019] aspirin EC  81 mg Oral Daily  . [START ON 03/19/2019] atorvastatin  80 mg Oral Daily  . carvedilol  25 mg Oral BID WC  . dextrose  12.5 g Intravenous STAT  . heparin  4,000 Units Intravenous Once  . hydrALAZINE  100 mg Oral BID  . [START ON 03/19/2019] insulin aspart  0-15 Units Subcutaneous TID WC  . sodium chloride flush  3 mL Intravenous Once  . [START ON 03/19/2019] tamsulosin  0.4 mg Oral Daily   Infusions:  . sodium chloride    . heparin    . nitroGLYCERIN      Assessment: 73 y.o. male presenting with chest pain and palpitations. PMH includes HTN, DM, HLD, CAD s/p PCI. No anticoagulation PTA. Hgb 14.8, Plts 184, Scr 1.43. Pharmacy consulted for heparin dosing.  Goal of Therapy:  Heparin level 0.3-0.7 units/ml Monitor platelets by anticoagulation  protocol: Yes   Plan:  Heparin bolus 4000 units Heparin gtt 1400 units/hr Check 6hr HL Daily HL and CBC Monitor for s/sx of bleeding   Lorel Monaco, PharmD PGY1 Ambulatory Care Resident [facility] # [phone]

## 2019-03-18 NOTE — ED Triage Notes (Signed)
Pt states since last night he has been having cp as well as feeling like his heart was beating irregular. Pts ekg reveled SR with frequent PVC's.

## 2019-03-19 ENCOUNTER — Encounter (HOSPITAL_COMMUNITY): Payer: Self-pay | Admitting: Cardiology

## 2019-03-19 ENCOUNTER — Inpatient Hospital Stay (HOSPITAL_COMMUNITY): Payer: Medicare Other

## 2019-03-19 ENCOUNTER — Encounter (HOSPITAL_COMMUNITY)
Admission: EM | Disposition: A | Discharge status: Home / Self Care | Payer: Self-pay | Source: Home / Self Care | Attending: Cardiology

## 2019-03-19 HISTORY — PX: LEFT HEART CATH AND CORONARY ANGIOGRAPHY: CATH118249

## 2019-03-19 LAB — CBC
HCT: 39.9 % (ref 39.0–52.0)
Hemoglobin: 13.5 g/dL (ref 13.0–17.0)
MCH: 28.9 pg (ref 26.0–34.0)
MCHC: 33.8 g/dL (ref 30.0–36.0)
MCV: 85.4 fL (ref 80.0–100.0)
Platelets: 161 10*3/uL (ref 150–400)
RBC: 4.67 MIL/uL (ref 4.22–5.81)
RDW: 13.1 % (ref 11.5–15.5)
WBC: 7.9 10*3/uL (ref 4.0–10.5)
nRBC: 0 % (ref 0.0–0.2)

## 2019-03-19 LAB — BASIC METABOLIC PANEL
Anion gap: 9 (ref 5–15)
Anion gap: 10 (ref 5–15)
BUN: 10 mg/dL (ref 8–23)
BUN: 10 mg/dL (ref 8–23)
CO2: 28 mmol/L (ref 22–32)
CO2: 26 mmol/L (ref 22–32)
Calcium: 8.6 mg/dL — ABNORMAL LOW (ref 8.9–10.3)
Calcium: 8.5 mg/dL — ABNORMAL LOW (ref 8.9–10.3)
Chloride: 100 mmol/L (ref 98–111)
Chloride: 101 mmol/L (ref 98–111)
Creatinine, Ser: 1.38 mg/dL — ABNORMAL HIGH (ref 0.61–1.24)
Creatinine, Ser: 1.38 mg/dL — ABNORMAL HIGH (ref 0.61–1.24)
GFR calc Af Amer: 58 mL/min — ABNORMAL LOW (ref >60)
GFR calc Af Amer: 58 mL/min — ABNORMAL LOW (ref >60)
GFR calc non Af Amer: 50 mL/min — ABNORMAL LOW (ref >60)
GFR calc non Af Amer: 50 mL/min — ABNORMAL LOW (ref >60)
Glucose, Bld: 106 mg/dL — ABNORMAL HIGH (ref 70–99)
Glucose, Bld: 107 mg/dL — ABNORMAL HIGH (ref 70–99)
Potassium: 3.1 mmol/L — ABNORMAL LOW (ref 3.5–5.1)
Potassium: 3.2 mmol/L — ABNORMAL LOW (ref 3.5–5.1)
Sodium: 137 mmol/L (ref 135–145)
Sodium: 137 mmol/L (ref 135–145)

## 2019-03-19 LAB — GLUCOSE, CAPILLARY
Glucose-Capillary: 98 mg/dL (ref 70–99)
Glucose-Capillary: 100 mg/dL — ABNORMAL HIGH (ref 70–99)
Glucose-Capillary: 95 mg/dL (ref 70–99)
Glucose-Capillary: 105 mg/dL — ABNORMAL HIGH (ref 70–99)

## 2019-03-19 LAB — LIPID PANEL
Cholesterol: 148 mg/dL (ref 0–200)
HDL: 32 mg/dL — ABNORMAL LOW (ref >40)
LDL Cholesterol: 90 mg/dL (ref 0–99)
Total CHOL/HDL Ratio: 4.6 RATIO
Triglycerides: 132 mg/dL (ref <150)
VLDL: 26 mg/dL (ref 0–40)

## 2019-03-19 LAB — HEPARIN LEVEL (UNFRACTIONATED)
Heparin Unfractionated: 0.35 IU/mL (ref 0.30–0.70)
Heparin Unfractionated: 0.41 IU/mL (ref 0.30–0.70)

## 2019-03-19 LAB — SARS CORONAVIRUS 2 (TAT 6-24 HRS): SARS Coronavirus 2: NEGATIVE

## 2019-03-19 LAB — ECHOCARDIOGRAM COMPLETE
Height: 72 in
Weight: 4235.2 oz

## 2019-03-19 LAB — POCT ACTIVATED CLOTTING TIME
Activated Clotting Time: 186 seconds
Activated Clotting Time: 158 seconds

## 2019-03-19 SURGERY — LEFT HEART CATH AND CORONARY ANGIOGRAPHY
Anesthesia: LOCAL

## 2019-03-19 MED ORDER — MIDAZOLAM HCL 2 MG/2ML IJ SOLN
INTRAMUSCULAR | Status: DC | PRN
  Administered 2019-03-19: 1 mg via INTRAVENOUS

## 2019-03-19 MED ORDER — LIDOCAINE HCL (PF) 1 % IJ SOLN
INTRAMUSCULAR | Status: DC | PRN
  Administered 2019-03-19: 17 mL via INTRADERMAL
  Administered 2019-03-19: 2 mL via INTRADERMAL

## 2019-03-19 MED ORDER — LIDOCAINE HCL (PF) 1 % IJ SOLN
INTRAMUSCULAR | Status: AC
  Filled 2019-03-19: qty 30

## 2019-03-19 MED ORDER — POTASSIUM CHLORIDE CRYS ER 20 MEQ PO TBCR: 40.0000 meq | EXTENDED_RELEASE_TABLET | Freq: Every day | ORAL | 2 refills | Status: DC

## 2019-03-19 MED ORDER — POTASSIUM CHLORIDE 20 MEQ PO PACK
40.0000 meq | PACK | Freq: Once | ORAL | Status: AC
  Administered 2019-03-19: 40 meq via ORAL
  Filled 2019-03-19: qty 2

## 2019-03-19 MED ORDER — HEPARIN (PORCINE) IN NACL 1000-0.9 UT/500ML-% IV SOLN
INTRAVENOUS | Status: AC
  Filled 2019-03-19: qty 1000

## 2019-03-19 MED ORDER — FENTANYL CITRATE (PF) 100 MCG/2ML IJ SOLN
INTRAMUSCULAR | Status: AC
  Filled 2019-03-19: qty 2

## 2019-03-19 MED ORDER — CLOPIDOGREL BISULFATE 75 MG PO TABS: 75.0000 mg | ORAL_TABLET | Freq: Every day | ORAL | 2 refills | Status: DC

## 2019-03-19 MED ORDER — SODIUM CHLORIDE 0.9% FLUSH
3.0000 mL | Freq: Two times a day (BID) | INTRAVENOUS | Status: DC
  Administered 2019-03-19: 3 mL via INTRAVENOUS

## 2019-03-19 MED ORDER — SODIUM CHLORIDE 0.9 % IV SOLN
INTRAVENOUS | Status: AC | PRN
  Administered 2019-03-19: 121 mL/h via INTRAVENOUS

## 2019-03-19 MED ORDER — VERAPAMIL HCL 2.5 MG/ML IV SOLN
INTRAVENOUS | Status: DC | PRN
  Administered 2019-03-19: 10 mL via INTRA_ARTERIAL

## 2019-03-19 MED ORDER — PERFLUTREN LIPID MICROSPHERE
1.0000 mL | INTRAVENOUS | Status: AC | PRN
  Administered 2019-03-19: 3.5 mL via INTRAVENOUS
  Filled 2019-03-19: qty 10

## 2019-03-19 MED ORDER — VERAPAMIL HCL 2.5 MG/ML IV SOLN
INTRAVENOUS | Status: AC
  Filled 2019-03-19: qty 2

## 2019-03-19 MED ORDER — MIDAZOLAM HCL 2 MG/2ML IJ SOLN
INTRAMUSCULAR | Status: AC
  Filled 2019-03-19: qty 2

## 2019-03-19 MED ORDER — CLOPIDOGREL BISULFATE 75 MG PO TABS: 75.0000 mg | ORAL_TABLET | Freq: Every day | ORAL | Status: DC

## 2019-03-19 MED ORDER — HEPARIN SODIUM (PORCINE) 1000 UNIT/ML IJ SOLN
INTRAMUSCULAR | Status: DC | PRN
  Administered 2019-03-19: 4000 [IU] via INTRAVENOUS

## 2019-03-19 MED ORDER — ATORVASTATIN CALCIUM 20 MG PO TABS: 20.0000 mg | ORAL_TABLET | Freq: Every day | ORAL | 2 refills | Status: DC

## 2019-03-19 MED ORDER — SODIUM CHLORIDE 0.9% FLUSH: 3.0000 mL | INTRAVENOUS | Status: DC | PRN

## 2019-03-19 MED ORDER — NITROGLYCERIN 1 MG/10 ML FOR IR/CATH LAB
INTRA_ARTERIAL | Status: AC
  Filled 2019-03-19: qty 10

## 2019-03-19 MED ORDER — SODIUM CHLORIDE 0.9 % IV SOLN: 250.0000 mL | INTRAVENOUS | Status: DC | PRN

## 2019-03-19 MED ORDER — HYDRALAZINE HCL 20 MG/ML IJ SOLN: 10.0000 mg | INTRAMUSCULAR | Status: DC | PRN

## 2019-03-19 MED ORDER — FENTANYL CITRATE (PF) 100 MCG/2ML IJ SOLN
INTRAMUSCULAR | Status: DC | PRN
  Administered 2019-03-19: 50 ug via INTRAVENOUS

## 2019-03-19 MED ORDER — IOHEXOL 350 MG/ML SOLN
INTRAVENOUS | Status: DC | PRN
  Administered 2019-03-19: 50 mL via INTRA_ARTERIAL

## 2019-03-19 MED ORDER — HEPARIN SODIUM (PORCINE) 1000 UNIT/ML IJ SOLN
INTRAMUSCULAR | Status: AC
  Filled 2019-03-19: qty 1

## 2019-03-19 MED ORDER — SODIUM CHLORIDE 0.9 % IV SOLN: INTRAVENOUS | Status: AC

## 2019-03-19 MED FILL — POTASSIUM CL ER 20 MEQ TABL: 20 | 30 days supply | Qty: 60 | Fill #0

## 2019-03-19 MED FILL — ATORVASTATIN CALCIUM 20 MG: 20 | 30 days supply | Qty: 30 | Fill #0

## 2019-03-19 MED FILL — CLOPIDOGREL 75 MG TABLET: 75 | 56 days supply | Qty: 60 | Fill #0

## 2019-03-19 SURGICAL SUPPLY — 17 items
CATH 5FR JL3.5 JR4 ANG PIG MP (CATHETERS) ×1 IMPLANT
CATH INFINITI 5F JL4 125CM (CATHETERS) ×1 IMPLANT
CATH INFINITI 5FR JL4 (CATHETERS) ×1 IMPLANT
CATH INFINITI 5FR JR4 125CM (CATHETERS) ×1 IMPLANT
DEVICE RAD COMP TR BAND LRG (VASCULAR PRODUCTS) ×1 IMPLANT
GLIDESHEATH SLEND A-KIT 6F 22G (SHEATH) ×1 IMPLANT
GUIDEWIRE INQWIRE 1.5J.035X260 (WIRE) IMPLANT
INQWIRE 1.5J .035X260CM (WIRE) ×2
KIT ENCORE 26 ADVANTAGE (KITS) IMPLANT
KIT HEART LEFT (KITS) ×2 IMPLANT
KIT MICROPUNCTURE NIT STIFF (SHEATH) ×1 IMPLANT
PACK CARDIAC CATHETERIZATION (CUSTOM PROCEDURE TRAY) ×2 IMPLANT
SHEATH PINNACLE 5F 10CM (SHEATH) ×1 IMPLANT
SHEATH PROBE COVER 6X72 (BAG) ×1 IMPLANT
TRANSDUCER W/STOPCOCK (MISCELLANEOUS) ×2 IMPLANT
TUBING CIL FLEX 10 FLL-RA (TUBING) ×2 IMPLANT
WIRE EMERALD 3MM-J .035X150CM (WIRE) ×1 IMPLANT

## 2019-03-19 NOTE — Progress Notes (Signed)
ANTICOAGULATION CONSULT NOTE  Pharmacy Consult for Heparin Indication: chest pain/ACS  No Known Allergies  Patient Measurements: Height: 6' (182.9 cm) Weight: 266 lb (120.7 kg) IBW/kg (Calculated) : 77.6 Heparin Dosing Weight: 104.1 kg  Vital Signs: Temp: 98.3 F (36.8 C) (09/24 2219) Temp Source: Oral (09/24 2219) BP: 155/76 (09/24 2219) Pulse Rate: 74 (09/24 2219)  Labs: Recent Labs    03/18/19 1334 03/18/19 1900 03/19/19 0321  HGB 14.8  --  13.5  HCT 46.2  --  39.9  PLT 184  --  161  HEPARINUNFRC  --   --  0.35  CREATININE 1.43*  --   --   TROPONINIHS 22* 25*  --     Estimated Creatinine Clearance: 60.8 mL/min (A) (by C-G formula based on SCr of 1.43 mg/dL (H)).   Medical History: Past Medical History:  Diagnosis Date  . CAD (coronary artery disease)   . COPD (chronic obstructive pulmonary disease) ([place])   . Diabetes mellitus   . Heart attack ([place])   . Hyperlipidemia   . Hypertension   . Neuromuscular disorder ([place])   . SOB (shortness of breath) on exertion     Medications:  Scheduled:  . amLODipine  5 mg Oral Daily  . aspirin  81 mg Oral Pre-Cath  . aspirin EC  81 mg Oral Daily  . atorvastatin  80 mg Oral Daily  . carvedilol  25 mg Oral BID WC  . dextrose  12.5 g Intravenous STAT  . hydrALAZINE  100 mg Oral BID  . insulin aspart  0-15 Units Subcutaneous TID WC  . sodium chloride flush  3 mL Intravenous Once  . sodium chloride flush  3 mL Intravenous Q12H  . tamsulosin  0.4 mg Oral Daily   Infusions:  . sodium chloride    . sodium chloride     Followed by  . sodium chloride    . heparin 1,400 Units/hr (03/18/19 2105)  . nitroGLYCERIN 10 mcg/min (03/18/19 2158)  . potassium chloride 10 mEq (03/19/19 0258)    Assessment: 73 y.o. male presenting with chest pain and palpitations. PMH includes HTN, DM, HLD, CAD s/p PCI. No anticoagulation PTA. Hgb 14.8, Plts 184, Scr 1.43. Pharmacy consulted for heparin dosing.  Initial heparin level  therapeutic at 0.35  Goal of Therapy:  Heparin level 0.3-0.7 units/ml Monitor platelets by anticoagulation protocol: Yes   Plan:  Continue heparin gtt at 1400 units/hr F/u 6 hour heparin level to confirm  Bertis Ruddy, PharmD Clinical Pharmacist Please check AMION for all [place] numbers 03/19/2019 3:54 AM

## 2019-03-19 NOTE — Interval H&P Note (Signed)
History and Physical Interval Note:  03/19/2019 2:45 PM  Gregory [name]  has presented today for surgery, with the diagnosis of unstable angina.  The various methods of treatment have been discussed with the patient and family. After consideration of risks, benefits and other options for treatment, the patient has consented to  Procedure(s): LEFT HEART CATH AND CORONARY ANGIOGRAPHY (N/A) as a surgical intervention.  The patient's history has been reviewed, patient examined, no change in status, stable for surgery.  I have reviewed the patient's chart and labs.  Questions were answered to the patient's satisfaction.    2016 Appropriate Use Criteria for Coronary Revascularization in Patients With Acute Coronary Syndrome NSTEMI/UA High Risk (TIMI Score 5-7) NSTEMI/Unstable angina, stabilized patient at high risk Link Here: sistemancia.com Indication:  Revascularization by PCI or CABG of 1 or more arteries in a patient with NSTEMI or unstable angina with Stabilization after presentation High risk for clinical events  A (7) Indication: 16; Score 7    [place]

## 2019-03-19 NOTE — Progress Notes (Signed)
Site area: Right groin a 5 french arterial sheath was removed  Site Prior to Removal:  Level 0  Pressure Applied For 20 MINUTES    Bedrest Beginning at 1710p  Manual:   Yes.    Patient Status During Pull:  stable  Post Pull Groin Site:  Level 0  Post Pull Instructions Given:  Yes.    Post Pull Pulses Present:  Yes.    Dressing Applied:  Yes.    Comments:  VS remain stable

## 2019-03-19 NOTE — Progress Notes (Signed)
Noted heparin drip was stopped when this nurse went to turn the pump off for a cardiac cath.  Pt stated nobody came in to turn it off.  He stated he might have turned it off accidentally when the pump was beeping about 35-45 minutes ago.  Idolina Primer, RN.

## 2019-03-19 NOTE — Progress Notes (Signed)
Echocardiogram 2D Echocardiogram has been performed.  Oneal Deputy Dicy Smigel 03/19/2019, 1:43 PM

## 2019-03-19 NOTE — Progress Notes (Signed)
Subjective:  No chest pain this morning  Objective:  Vital Signs in the last 24 hours: Temp:  [98.1 F (36.7 C)-98.3 F (36.8 C)] 98.2 F (36.8 C) (09/25 0623) Pulse Rate:  [58-74] 61 (09/25 0623) Resp:  [11-20] 20 (09/25 0623) BP: (131-171)/(63-99) 131/80 (09/25 0623) SpO2:  [92 %-99 %] 92 % (09/25 0623) Weight:  [120.1 kg-120.7 kg] 120.1 kg (09/25 0623)  Intake/Output from previous day: 09/24 0701 - 09/25 0700 In: 1281.5 [P.O.:236; I.V.:639.8; IV Piggyback:405.7] Out: -   Physical Exam Constitutional: He is oriented to person, place, and time. He appears well-developed and well-nourished. No distress.  HENT:  Head: Normocephalic and atraumatic.  Eyes: Pupils are equal, round, and reactive to light. Conjunctivae are normal.  Neck: No JVD present.  Cardiovascular: Normal rate, regular rhythm and intact distal pulses. Frequent extrasystoles are present.  No murmur heard. Pulmonary/Chest: Effort normal and breath sounds normal. He has no wheezes. He has no rales.  Abdominal: Soft. Bowel sounds are normal. There is no rebound.  Musculoskeletal:        General: Edema -trace present.  Lymphadenopathy:    He has no cervical adenopathy.  Neurological: He is alert and oriented to person, place, and time. No cranial nerve deficit.  Skin: Skin is warm and dry. He is not diaphoretic.  Psychiatric: He has a normal mood and affect.  Nursing note and vitals reviewed.    Lab Results: BMP Recent Labs    03/18/19 1334 03/19/19 0321 03/19/19 0641  NA 139 137 137  K 3.0* 3.1* 3.2*  CL 98 100 101  CO2 27 28 26   GLUCOSE 115* 106* 107*  BUN 11 10 10   CREATININE 1.43* 1.38* 1.38*  CALCIUM 9.3 8.6* 8.5*  GFRNONAA 48* 50* 50*  GFRAA 56* 58* 58*    CBC Recent Labs  Lab 03/19/19 0321  WBC 7.9  RBC 4.67  HGB 13.5  HCT 39.9  PLT 161  MCV 85.4  MCH 28.9  MCHC 33.8  RDW 13.1    HEMOGLOBIN A1C Lab Results  Component Value Date   HGBA1C 6.0 (H) 03/18/2019   MPG 125.5  03/18/2019    Cardiac Panel (last 3 results) No results for input(s): CKTOTAL, CKMB, TROPONINI, RELINDX in the last 8760 hours.  BNP (last 3 results) No results for input(s): BNP in the last 8760 hours.  TSH No results for input(s): TSH in the last 8760 hours.  Lipid Panel     Component Value Date/Time   CHOL 148 03/19/2019 0321   TRIG 132 03/19/2019 0321   HDL 32 (L) 03/19/2019 0321   CHOLHDL 4.6 03/19/2019 0321   VLDL 26 03/19/2019 0321   LDLCALC 90 03/19/2019 0321    CARDIAC STUDIES:  EKG 03/18/2019: Sinus rhythm. Old anteroseptal infarct. Frequent PVC's  Echocardiogram pending  Coronary angiography 2013: Mild LAD disease. D2 85% stenosis-->XIence 2.5 X 15 mm DES. Patent LCx stents from 2005- Mid LCx 3.5X18 & 3.5X12 mm ZOmax study stent RCA:??  Assessment/Plan  73 y.o. African-American male  with hypertension, type 2 diabetes mellitus, hyperlipidemia, former smoker, coronary artery disease status post prior PCI, admitted with chest pain and palpitations.  NSTEMI: Mild trop elevation with chest pain at rest. Currently chest pain free. Plan for cath this afternoon. Okay to eat breakfast.  Recommend aspirin, heparin drip, Coreg 25 mg twice daily, Lipitor 80 mg daily. Started nitro drip for chest pain control. Continue amlodipine 5 mg daily.  Hold valsartan HCTZ in light of coronary angiography tomorrow. Echocardiogram,  lipid panel  Type II DM: Hold metformin  Nigel Mormon, M.D. 03/19/2019, 7:39 AM [facility] Pager: [phone] Office: [phone] If no answer: [phone]

## 2019-03-19 NOTE — Discharge Summary (Signed)
Physician Discharge Summary  Patient ID: Gregory [name] MRN: [id number] DOB/AGE: 1946/04/15 73 y.o.  Admit date: 03/18/2019 Discharge date: 03/19/2019  Primary Discharge Diagnosis: NSTEMI  Secondary Discharge Diagnosis: Hypertension Type 2 DM   Hospital Course:   73 y.o. African-American male  with hypertension, type 2 diabetes mellitus, hyperlipidemia, former smoker, coronary artery disease status post prior PCI, admitted with chest pain and palpitations.  Chest pain/palpitations. Mild HS trop elevation 22-->25. No new stenoses that warrant intervention. Recommend medical management with Aspirin and plavix  Palpitations: No arrhthymias noted, other than frequent PVC's. No changes made to baseline medical management.  Hypertension: Better controlled on discharge.  Hypokalemia: Chronic problem. Consider outpatient workup for hyperaldosteronism,   Discharge Exam: Blood pressure 120/73, pulse 61, temperature 98.2 F (36.8 C), temperature source Oral, resp. rate 20, height 6' (1.829 m), weight 120.1 kg, SpO2 92 %.   Constitutional: He isoriented to person, place, and time. He appearswell-developedand well-nourished.No distress.  HENT:  Head:Normocephalicand atraumatic.  Eyes:Pupils are equal, round, and reactive to light.Conjunctivaeare normal.  Neck:No JVDpresent.  Cardiovascular:Normal rate,regular rhythmand intact distal pulses.Frequentextrasystolesare present.  No murmurheard. Pulmonary/Chest:Effort normaland breath sounds normal. He hasno wheezes. He hasno rales.  Abdominal:Soft.Bowel sounds are normal. There isno rebound.  Musculoskeletal:  General: Edema-tracepresent.  Lymphadenopathy:  He has no cervical adenopathy.  Neurological: He isalertand oriented to person, place, and time. Nocranial nerve deficit.  Skin: Skin iswarmand dry. He isnot diaphoretic.  Psychiatric: He has anormal mood and affect. Nursing noteand  vitalsreviewed. Recommendations on discharge:   Significant Diagnostic Studies:  Coronary angiography 03/19/2019: LM: Normal LAD: Mid LAD focal 40% stenosis          Ostial diag 2 80% stenosis with TIMI III flow. Unchanged compared to 2013 angiography.          Patent Diag 2 stent with ISR LCx:: Patent mid LCX overlapping stents with 10-20% late lumen loss RCA: Mid 100% occlusion with grade 3 left-to-right collaterals.   Echocardiogram 03/19/2019: 1. Mild basal septal hypertrophy. Moderate inferior hypokinesis. Assessment limited due to frequent PVC's. LVEF 40-45%. Grade 1 diastolic dysfunction. Left ventricular ejection fraction, by visual estimation, is 40 to 45%. The left ventricle has mildly  decreased function. Left ventricular septal wall thickness was mildly increased. There is mildly increased left ventricular hypertrophy. 2. Mild tricuspid regurgitation. Estimated PASP 30 mmHg.   EKG9/24/2020: Sinus rhythm. Old anteroseptal infarct. Frequent PVC's  Labs:   Lab Results  Component Value Date   WBC 7.9 03/19/2019   HGB 13.5 03/19/2019   HCT 39.9 03/19/2019   MCV 85.4 03/19/2019   PLT 161 03/19/2019    Recent Labs  Lab 03/19/19 0641  NA 137  K 3.2*  CL 101  CO2 26  BUN 10  CREATININE 1.38*  CALCIUM 8.5*  GLUCOSE 107*    Lipid Panel     Component Value Date/Time   CHOL 148 03/19/2019 0321   TRIG 132 03/19/2019 0321   HDL 32 (L) 03/19/2019 0321   CHOLHDL 4.6 03/19/2019 0321   VLDL 26 03/19/2019 0321   LDLCALC 90 03/19/2019 0321    BNP (last 3 results) No results for input(s): BNP in the last 8760 hours.  HEMOGLOBIN A1C Lab Results  Component Value Date   HGBA1C 6.0 (H) 03/18/2019   MPG 125.5 03/18/2019   Radiology: Dg Chest 2 View  Result Date: 03/18/2019 CLINICAL DATA:  Chest pain EXAM: CHEST - 2 VIEW COMPARISON:  May 03, 2015 FINDINGS: There is mild cardiomegaly. Elevation of the right  hemidiaphragm is seen. The lungs are clear. No  focal airspace consolidation or pleural effusion. No acute osseous abnormality. IMPRESSION: No acute cardiopulmonary process. Electronically Signed   By: Prudencio Pair M.D.   On: 03/18/2019 14:12      FOLLOW UP PLANS AND APPOINTMENTS Discharge Instructions    Diet - low sodium heart healthy   Complete by: As directed    Increase activity slowly   Complete by: As directed      Allergies as of 03/19/2019   No Known Allergies     Medication List    TAKE these medications   amLODipine 5 MG tablet Commonly known as: NORVASC Take 1 tablet (5 mg total) by mouth daily.   aspirin 81 MG tablet Take 81 mg by mouth daily.   atorvastatin 20 MG tablet Commonly known as: LIPITOR Take 1 tablet (20 mg total) by mouth daily at 6 PM. What changed:   medication strength  how much to take  when to take this   carvedilol 25 MG tablet Commonly known as: COREG Take 50 mg by mouth daily.   clopidogrel 75 MG tablet Commonly known as: PLAVIX Take 1 tablet (75 mg total) by mouth daily. Please take 4 pills on 9/26 AM. Then, take 1 pill daily from 9/27 onwards Start taking on: March 20, 2019   hydrALAZINE 100 MG tablet Commonly known as: APRESOLINE Take 100 mg by mouth 2 (two) times daily.   metFORMIN 500 MG tablet Commonly known as: GLUCOPHAGE Take 500 mg by mouth daily.   potassium chloride SA 20 MEQ tablet Commonly known as: K-DUR Take 2 tablets (40 mEq total) by mouth daily.   tamsulosin 0.4 MG Caps capsule Commonly known as: FLOMAX Take 0.4 mg by mouth daily.   valsartan-hydrochlorothiazide 160-25 MG tablet Commonly known as: DIOVAN-HCT Take 1 tablet by mouth daily.      Follow-up Information    Adrian Prows, MD Follow up on 03/29/2019.   Specialty: Cardiology Why: 11:45 AM Please arrive by 11:30 AM Contact information: [place] [address] [place] MD, [facility] Pager:  [phone] Office: [phone] If no answer: [phone]

## 2019-03-19 NOTE — Progress Notes (Signed)
ANTICOAGULATION CONSULT NOTE  Pharmacy Consult for Heparin Indication: chest pain/ACS  No Known Allergies  Patient Measurements: Height: 6' (182.9 cm) Weight: 264 lb 11.2 oz (120.1 kg) IBW/kg (Calculated) : 77.6 Heparin Dosing Weight: 104.1 kg  Vital Signs: Temp: 98.2 F (36.8 C) (09/25 0623) Temp Source: Oral (09/25 0623) BP: 120/73 (09/25 0852) Pulse Rate: 61 (09/25 0623)  Labs: Recent Labs    03/18/19 1334 03/18/19 1900 03/19/19 0321 03/19/19 0641 03/19/19 0954  HGB 14.8  --  13.5  --   --   HCT 46.2  --  39.9  --   --   PLT 184  --  161  --   --   HEPARINUNFRC  --   --  0.35  --  0.41  CREATININE 1.43*  --  1.38* 1.38*  --   TROPONINIHS 22* 25*  --   --   --     Estimated Creatinine Clearance: 62.8 mL/min (A) (by C-G formula based on SCr of 1.38 mg/dL (H)).   Medical History: Past Medical History:  Diagnosis Date  . CAD (coronary artery disease)   . COPD (chronic obstructive pulmonary disease) ([place])   . Diabetes mellitus   . Heart attack ([place])   . Hyperlipidemia   . Hypertension   . Neuromuscular disorder ([place])   . SOB (shortness of breath) on exertion     Medications:  Scheduled:  . amLODipine  5 mg Oral Daily  . aspirin EC  81 mg Oral Daily  . atorvastatin  80 mg Oral Daily  . carvedilol  25 mg Oral BID WC  . dextrose  12.5 g Intravenous STAT  . hydrALAZINE  100 mg Oral BID  . insulin aspart  0-15 Units Subcutaneous TID WC  . sodium chloride flush  3 mL Intravenous Once  . sodium chloride flush  3 mL Intravenous Q12H  . tamsulosin  0.4 mg Oral Daily   Infusions:  . sodium chloride    . sodium chloride 1 mL/kg/hr (03/19/19 0903)  . heparin 1,400 Units/hr (03/19/19 0545)  . nitroGLYCERIN 10 mcg/min (03/19/19 0545)    Assessment: 73 y.o. male presenting with chest pain and palpitations. PMH includes HTN, DM, HLD, CAD s/p PCI. No anticoagulation PTA. Plans noted for cath today -heparin level at goal, CBC stable   Goal of Therapy:   Heparin level 0.3-0.7 units/ml Monitor platelets by anticoagulation protocol: Yes   Plan:  Continue heparin gtt at 1400 units/hr Daily heparin level and CBC  Hildred Laser, PharmD Clinical Pharmacist **Pharmacist phone directory can now be found on [email or web address] ([email or web address]).  Listed under [place].

## 2019-03-19 NOTE — H&P (View-Only) (Signed)
Subjective:  No chest pain this morning  Objective:  Vital Signs in the last 24 hours: Temp:  [98.1 F (36.7 C)-98.3 F (36.8 C)] 98.2 F (36.8 C) (09/25 0623) Pulse Rate:  [58-74] 61 (09/25 0623) Resp:  [11-20] 20 (09/25 0623) BP: (131-171)/(63-99) 131/80 (09/25 0623) SpO2:  [92 %-99 %] 92 % (09/25 0623) Weight:  [120.1 kg-120.7 kg] 120.1 kg (09/25 0623)  Intake/Output from previous day: 09/24 0701 - 09/25 0700 In: 1281.5 [P.O.:236; I.V.:639.8; IV Piggyback:405.7] Out: -   Physical Exam Constitutional: He is oriented to person, place, and time. He appears well-developed and well-nourished. No distress.  HENT:  Head: Normocephalic and atraumatic.  Eyes: Pupils are equal, round, and reactive to light. Conjunctivae are normal.  Neck: No JVD present.  Cardiovascular: Normal rate, regular rhythm and intact distal pulses. Frequent extrasystoles are present.  No murmur heard. Pulmonary/Chest: Effort normal and breath sounds normal. He has no wheezes. He has no rales.  Abdominal: Soft. Bowel sounds are normal. There is no rebound.  Musculoskeletal:        General: Edema -trace present.  Lymphadenopathy:    He has no cervical adenopathy.  Neurological: He is alert and oriented to person, place, and time. No cranial nerve deficit.  Skin: Skin is warm and dry. He is not diaphoretic.  Psychiatric: He has a normal mood and affect.  Nursing note and vitals reviewed.    Lab Results: BMP Recent Labs    03/18/19 1334 03/19/19 0321 03/19/19 0641  NA 139 137 137  K 3.0* 3.1* 3.2*  CL 98 100 101  CO2 27 28 26   GLUCOSE 115* 106* 107*  BUN 11 10 10   CREATININE 1.43* 1.38* 1.38*  CALCIUM 9.3 8.6* 8.5*  GFRNONAA 48* 50* 50*  GFRAA 56* 58* 58*    CBC Recent Labs  Lab 03/19/19 0321  WBC 7.9  RBC 4.67  HGB 13.5  HCT 39.9  PLT 161  MCV 85.4  MCH 28.9  MCHC 33.8  RDW 13.1    HEMOGLOBIN A1C Lab Results  Component Value Date   HGBA1C 6.0 (H) 03/18/2019   MPG 125.5  03/18/2019    Cardiac Panel (last 3 results) No results for input(s): CKTOTAL, CKMB, TROPONINI, RELINDX in the last 8760 hours.  BNP (last 3 results) No results for input(s): BNP in the last 8760 hours.  TSH No results for input(s): TSH in the last 8760 hours.  Lipid Panel     Component Value Date/Time   CHOL 148 03/19/2019 0321   TRIG 132 03/19/2019 0321   HDL 32 (L) 03/19/2019 0321   CHOLHDL 4.6 03/19/2019 0321   VLDL 26 03/19/2019 0321   LDLCALC 90 03/19/2019 0321    CARDIAC STUDIES:  EKG 03/18/2019: Sinus rhythm. Old anteroseptal infarct. Frequent PVC's  Echocardiogram pending  Coronary angiography 2013: Mild LAD disease. D2 85% stenosis-->XIence 2.5 X 15 mm DES. Patent LCx stents from 2005- Mid LCx 3.5X18 & 3.5X12 mm ZOmax study stent RCA:??  Assessment/Plan  73 y.o. African-American male  with hypertension, type 2 diabetes mellitus, hyperlipidemia, former smoker, coronary artery disease status post prior PCI, admitted with chest pain and palpitations.  NSTEMI: Mild trop elevation with chest pain at rest. Currently chest pain free. Plan for cath this afternoon. Okay to eat breakfast.  Recommend aspirin, heparin drip, Coreg 25 mg twice daily, Lipitor 80 mg daily. Started nitro drip for chest pain control. Continue amlodipine 5 mg daily.  Hold valsartan HCTZ in light of coronary angiography tomorrow. Echocardiogram,  lipid panel  Type II DM: Hold metformin  Nigel Mormon, M.D. 03/19/2019, 7:39 AM [facility] Pager: [phone] Office: [phone] If no answer: [phone]

## 2019-03-22 MED FILL — Nitroglycerin IV Soln 100 MCG/ML in D5W: INTRA_ARTERIAL | Qty: 10 | Status: AC

## 2019-03-22 MED FILL — Heparin Sod (Porcine)-NaCl IV Soln 1000 Unit/500ML-0.9%: INTRAVENOUS | Qty: 1000 | Status: AC

## 2019-03-23 ENCOUNTER — Telehealth: Payer: Self-pay

## 2019-03-23 NOTE — Telephone Encounter (Signed)
-----   Message from Nigel Mormon, MD sent at 03/20/2019  5:52 AM EDT ----- Regarding: TOC Discharge follow up: TOC: Needed Follow up appt: 10/5  Discharge diagnosis: NSTEMI Discharge date: 9/25  Thanks MJP

## 2019-03-23 NOTE — Telephone Encounter (Signed)
Location of hospitalization: [facility] Reason for hospitalization: NSTEMI Date of discharge: 03/19/19 Date of first communication with patient: 03/22/19 Person contacting patient: Telford Nab B Current symptoms: good Do you understand why you were in the Hospital: Yes Questions regarding discharge instructions: None Where were you discharged to: Home Medications reviewed: Yes Allergies reviewed: Yes Dietary changes reviewed: Yes. Discussed low fat and low salt diet.  Referals reviewed: NA Activities of Daily Living: Able to with mild limitations Any transportation issues/concerns: None Any patient concerns: None Confirmed importance & date/time of Follow up appt: Yes 03/29/19  Confirmed with patient if condition begins to worsen call. Pt was given the office number and encouraged to call back with questions or concerns: Yes

## 2019-03-24 ENCOUNTER — Telehealth: Payer: Self-pay

## 2019-03-24 NOTE — Telephone Encounter (Signed)
error 

## 2019-03-29 ENCOUNTER — Ambulatory Visit: Payer: Medicare Other | Admitting: Cardiology

## 2019-03-29 ENCOUNTER — Other Ambulatory Visit: Payer: Self-pay

## 2019-03-29 ENCOUNTER — Encounter: Payer: Self-pay | Admitting: Cardiology

## 2019-03-29 VITALS — BP 165/90 | HR 71 | Ht 72.0 in | Wt 264.8 lb

## 2019-03-29 DIAGNOSIS — N1831 Chronic kidney disease, stage 3a: Secondary | ICD-10-CM

## 2019-03-29 DIAGNOSIS — I129 Hypertensive chronic kidney disease with stage 1 through stage 4 chronic kidney disease, or unspecified chronic kidney disease: Secondary | ICD-10-CM

## 2019-03-29 DIAGNOSIS — I10 Essential (primary) hypertension: Secondary | ICD-10-CM

## 2019-03-29 DIAGNOSIS — E876 Hypokalemia: Secondary | ICD-10-CM | POA: Diagnosis not present

## 2019-03-29 DIAGNOSIS — I480 Paroxysmal atrial fibrillation: Secondary | ICD-10-CM

## 2019-03-29 DIAGNOSIS — I2510 Atherosclerotic heart disease of native coronary artery without angina pectoris: Secondary | ICD-10-CM | POA: Diagnosis not present

## 2019-03-29 MED ORDER — SPIRONOLACTONE 25 MG PO TABS: 25.0000 mg | ORAL_TABLET | ORAL | 2 refills | Status: DC

## 2019-03-29 MED ORDER — APIXABAN 5 MG PO TABS: 5.0000 mg | ORAL_TABLET | Freq: Two times a day (BID) | ORAL | 3 refills | Status: DC

## 2019-03-29 MED ORDER — APIXABAN 5 MG PO TABS: 5.0000 mg | ORAL_TABLET | Freq: Two times a day (BID) | ORAL | 0 refills | Status: DC

## 2019-03-29 NOTE — Progress Notes (Signed)
Primary Physician/Referring:  [name], MD  Patient ID: Gregory [name], male    DOB: 1946/06/18, 73 y.o.   MRN: [id number]  Chief Complaint  Patient presents with  . Hospitalization Follow-up    LHC  . Chest Pain   HPI:    Gregory [name]  is a 73 y.o. 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  He presented with palpitations, dyspnea. on 03/18/2019 with minimally elevated cardiac markers, underwent coronary angiography the following day revealing patent diagonal stent, however the ostium of the D2 had 80% stenosis that was unchanged from 2013.  Previously placed circumflex stent was widely patent.  States that he has not had any chest pain but still continues to have palpitations And also marked dyspnea.  No leg edema, no PND or orthopnea.  Otherwise feels well but is concerned about symptoms.  Past Medical History:  Diagnosis Date  . CAD (coronary artery disease)   . COPD (chronic obstructive pulmonary disease) ([place])   . Diabetes mellitus   . Heart attack ([place])   . Hyperlipidemia   . Hypertension   . Neuromuscular disorder ([place])   . SOB (shortness of breath) on exertion    Past Surgical History:  Procedure Laterality Date  . BACK SURGERY    . CARDIAC CATHETERIZATION    . CORONARY STENT PLACEMENT    . KNEE ARTHROPLASTY    . LEFT HEART CATH AND CORONARY ANGIOGRAPHY N/A 03/19/2019   Procedure: LEFT HEART CATH AND CORONARY ANGIOGRAPHY;  Surgeon: Nigel Mormon, MD;  Location: [place] [facility];  Service: Cardiovascular;  Laterality: N/A;  . LEFT HEART CATHETERIZATION WITH CORONARY ANGIOGRAM N/A 03/03/2012   Procedure: LEFT HEART CATHETERIZATION WITH CORONARY ANGIOGRAM;  Surgeon: Laverda Page, MD;  Location: [facility] CATH LAB;  Service: Cardiovascular;  Laterality: N/A;  . PERCUTANEOUS CORONARY  STENT INTERVENTION (PCI-S)  03/03/2012   Procedure: PERCUTANEOUS CORONARY STENT INTERVENTION (PCI-S);  Surgeon: Laverda Page, MD;  Location: [facility] CATH LAB;  Service: Cardiovascular;;   Social History   Socioeconomic History  . Marital status: Married    Spouse name: Not on file  . Number of children: 3  . Years of education: Not on file  . Highest education level: Not on file  Occupational History  . Occupation: retired  Scientific laboratory technician  . Financial resource strain: Not on file  . Food insecurity    Worry: Not on file    Inability: Not on file  . Transportation needs    Medical: Not on file    Non-medical: Not on file  Tobacco Use  . Smoking status: Former Smoker    Packs/day: 1.00    Years: 30.00    Pack years: 30.00    Types: Cigarettes    Quit date: 07/30/2003    Years since quitting: 15.6  . Smokeless tobacco: Never Used  Substance and Sexual Activity  . Alcohol use: Yes    Alcohol/week: 0.0 standard drinks    Comment: wine rarely  . Drug use: No  . Sexual activity: Not on file  Lifestyle  . Physical activity    Days per week: Not on file    Minutes per session: Not on file  . Stress: Not on file  Relationships  . Social Herbalist on phone: Not on file    Gets together: Not on file  Attends religious service: Not on file    Active member of club or organization: Not on file    Attends meetings of clubs or organizations: Not on file    Relationship status: Not on file  . Intimate partner violence    Fear of current or ex partner: Not on file    Emotionally abused: Not on file    Physically abused: Not on file    Forced sexual activity: Not on file  Other Topics Concern  . Not on file  Social History Narrative  . Not on file   ROS  Review of Systems  Constitution: Negative for chills, decreased appetite, malaise/fatigue and weight gain.  Cardiovascular: Positive for palpitations. Negative for dyspnea on exertion, leg swelling and syncope.   Respiratory: Positive for cough and shortness of breath.   Endocrine: Negative for cold intolerance.  Hematologic/Lymphatic: Does not bruise/bleed easily.  Musculoskeletal: Negative for joint swelling.  Gastrointestinal: Negative for abdominal pain, anorexia, change in bowel habit, hematochezia and melena.  Neurological: Negative for headaches and light-headedness.  Psychiatric/Behavioral: Negative for depression and substance abuse.  All other systems reviewed and are negative.  Objective   Vitals with BMI 03/29/2019 03/19/2019 03/19/2019  Height 6\' 0"  - -  Weight 264 lbs 13 oz - -  BMI 01.02 - -  Systolic 725 366 440  Diastolic 90 95 94  Pulse 71 - 58    Blood pressure (!) 165/90, pulse 71, height 6' (1.829 m), weight 264 lb 12.8 oz (120.1 kg), SpO2 98 %. Body mass index is 35.91 kg/m.   Physical Exam  Constitutional:  He is well-built and moderately obese in no acute distress.  HENT:  Head: Atraumatic.  Eyes: Conjunctivae are normal.  Neck: Neck supple. No JVD present. No thyromegaly present.  Cardiovascular: Intact distal pulses and normal pulses. An irregularly irregular rhythm present. Exam reveals no gallop, no S3 and no S4.  No murmur heard. S1 is variable, S2 is normal.   Pulmonary/Chest: Effort normal and breath sounds normal.  Abdominal: Soft. Bowel sounds are normal.  Musculoskeletal: Normal range of motion.        General: No edema.  Neurological: He is alert.  Skin: Skin is warm and dry.  Psychiatric: He has a normal mood and affect.   Radiology: No results found.  Laboratory examination:   Recent Labs    03/18/19 1334 03/19/19 0321 03/19/19 0641  NA 139 137 137  K 3.0* 3.1* 3.2*  CL 98 100 101  CO2 27 28 26   GLUCOSE 115* 106* 107*  BUN 11 10 10   CREATININE 1.43* 1.38* 1.38*  CALCIUM 9.3 8.6* 8.5*  GFRNONAA 48* 50* 50*  GFRAA 56* 58* 58*   CMP Latest Ref Rng & Units 03/19/2019 03/19/2019 03/18/2019  Glucose 70 - 99 mg/dL 107(H) 106(H) 115(H)   BUN 8 - 23 mg/dL 10 10 11   Creatinine 0.61 - 1.24 mg/dL 1.38(H) 1.38(H) 1.43(H)  Sodium 135 - 145 mmol/L 137 137 139  Potassium 3.5 - 5.1 mmol/L 3.2(L) 3.1(L) 3.0(L)  Chloride 98 - 111 mmol/L 101 100 98  CO2 22 - 32 mmol/L 26 28 27   Calcium 8.9 - 10.3 mg/dL 8.5(L) 8.6(L) 9.3   CBC Latest Ref Rng & Units 03/19/2019 03/18/2019 03/04/2012  WBC 4.0 - 10.5 K/uL 7.9 7.5 8.5  Hemoglobin 13.0 - 17.0 g/dL 13.5 14.8 12.4(L)  Hematocrit 39.0 - 52.0 % 39.9 46.2 37.7(L)  Platelets 150 - 400 K/uL 161 184 161   Lipid Panel  Component Value Date/Time   CHOL 148 03/19/2019 0321   TRIG 132 03/19/2019 0321   HDL 32 (L) 03/19/2019 0321   CHOLHDL 4.6 03/19/2019 0321   VLDL 26 03/19/2019 0321   LDLCALC 90 03/19/2019 0321   HEMOGLOBIN A1C Lab Results  Component Value Date   HGBA1C 6.0 (H) 03/18/2019   MPG 125.5 03/18/2019   TSH No results for input(s): TSH in the last 8760 hours. Medications and allergies  No Known Allergies   Prior to Admission medications   Medication Sig Start Date End Date Taking? Authorizing Provider  amLODipine (NORVASC) 5 MG tablet Take 1 tablet (5 mg total) by mouth daily. 11/04/18  Yes Miquel Dunn, NP  aspirin 81 MG tablet Take 81 mg by mouth daily.    Yes [provider]  atorvastatin (LIPITOR) 20 MG tablet Take 1 tablet (20 mg total) by mouth daily at 6 PM. 03/19/19  Yes Patwardhan, Manish J, MD  carvedilol (COREG) 25 MG tablet Take 25 mg by mouth daily.  03/04/12  Yes Adrian Prows, MD  clopidogrel (PLAVIX) 75 MG tablet Take 1 tablet (75 mg total) by mouth daily. Please take 4 pills on 9/26 AM. Then, take 1 pill daily from 9/27 onwards 03/20/19  Yes Patwardhan, Manish J, MD  hydrALAZINE (APRESOLINE) 100 MG tablet Take 100 mg by mouth 2 (two) times daily.  10/06/16  Yes [provider]  metFORMIN (GLUCOPHAGE) 500 MG tablet Take 500 mg by mouth daily.   Yes [provider]  potassium chloride SA (K-DUR) 20 MEQ tablet Take 2 tablets (40 mEq  total) by mouth daily. 03/19/19  Yes Patwardhan, Manish J, MD  Tamsulosin HCl (FLOMAX) 0.4 MG CAPS Take 0.4 mg by mouth daily.   Yes [provider]  valsartan-hydrochlorothiazide (DIOVAN-HCT) 160-25 MG tablet Take 1 tablet by mouth daily.   Yes [provider]     Current Outpatient Medications  Medication Instructions  . amLODipine (NORVASC) 5 mg, Oral, Daily  . apixaban (ELIQUIS) 5 mg, Oral, 2 times daily  . apixaban (ELIQUIS) 5 mg, Oral, 2 times daily  . atorvastatin (LIPITOR) 20 mg, Oral, Daily-1800  . carvedilol (COREG) 25 mg, Oral, 2 times daily  . hydrALAZINE (APRESOLINE) 100 mg, Oral, 2 times daily  . metFORMIN (GLUCOPHAGE) 500 mg, Daily  . potassium chloride SA (K-DUR) 20 MEQ tablet 40 mEq, Oral, Daily  . spironolactone (ALDACTONE) 25 mg, Oral, BH-each morning  . tamsulosin (FLOMAX) 0.4 mg, Oral, Daily  . valsartan-hydrochlorothiazide (DIOVAN-HCT) 160-25 MG tablet 1 tablet, Oral, Daily    Cardiac Studies:   Echocardiogram 03/19/2019:    1. Mild basal septal hypertrophy. Moderate inferior hypokinesis. Assessment limited due to frequent PVC's. LVEF 40-45%. Grade 1 diastolic dysfunction. Left ventricular ejection fraction, by visual estimation, is 40 to 45%. The left ventricle has mildly  decreased function. Left ventricular septal wall thickness was mildly increased. There is mildly increased left ventricular hypertrophy.  2. Mild tricuspid regurgitation. Estimated PASP 30 mmhg.   Coronary Angiography 03/19/2019:  No change from 03/06/2012 LM: Normal LAD: Mid LAD focal 40% stenosis          Ostial diag-2 80% stenosis with TIMI III flow. Unchanged compared to 2013 angiography. Patent mid Diag 2 stent without ISR LCx: Patent mid LCX overlapping stents with 10-20% late lumen loss RCA: CTO Mid 100% occlusion with grade 3 left-to-right collaterals.  Assessment     ICD-10-CM   1. Paroxysmal atrial fibrillation (HCC)  I48.0 EKG 12-Lead    apixaban (  ELIQUIS) 5  MG TABS tablet    apixaban (ELIQUIS) 5 MG TABS tablet   CHA2DS2-VASc Score is 4.  Yearly risk of stroke: 4%.   2. Atherosclerosis of native coronary artery of native heart without angina pectoris  I25.10   3. Essential hypertension  I10 spironolactone (ALDACTONE) 25 MG tablet    Basic metabolic panel  4. Hypokalemia  E87.6   5. Stage 3a chronic kidney disease  N18.31     EKG 03/29/2019: Atrial fibrillation with  rapid ventricular response at rate of 115 bpm, normal axis.  Anteroseptal infarct old.  Nonspecific T normality.  Single PVC.  Recommendations:   Patient presenting for follow-up of recent hospitalization with shortness of breath and palpitations, today he is in atrial fibrillation with RVR.  I reviewed his labs, cardiac catheterization, his symptoms clearly probably related to new onset of atrial fibrillation.  He is presently taking carvedilol 25 mg once a day which I increased it to twice daily.  He needs better rate control, I will consider adding amiodarone or Multitaq once he is fully anticoagulated.  Weight loss was discussed extensively with the patient.  I will start him on Eliquis 5 mg p.o. twice daily, I have extensively discussed with the patient regarding the risks associated with anticoagulation and benefits of anticoagulation in view of high cardioembolic risk.  Patient is willing to be on Eliquis.  I will discontinue aspirin and Plavix as his coronary artery disease has been stable and his presentation was more consistent with A. fib with RVR and not non-STEMI or unstable angina pectoris.  Blood pressure still is elevated, continues to have hypokalemia, consider secondary causes of hypertension including adrenal adenoma.  I will start him on Aldactone 25 mg daily and obtain a BMP in 2 weeks, he does have underlying stage III chronic kidney disease and needs to be closely followed up.  With regard to coronary artery disease, no significant change in coronary anatomy.  I  would like to see him back in 3 weeks for follow-up.  This was a transition of care visit less than 7 days due to complexity of medical issues and compliance with medications.  Adrian Prows, MD, [facility] 03/29/2019, 1:02 PM [facility]. [place] Pager: [phone] Office: [phone] If no answer Cell [phone]

## 2019-04-13 LAB — BASIC METABOLIC PANEL
BUN/Creatinine Ratio: 14 (ref 10–24)
BUN: 25 mg/dL (ref 8–27)
CO2: 23 mmol/L (ref 20–29)
Calcium: 9 mg/dL (ref 8.6–10.2)
Chloride: 101 mmol/L (ref 96–106)
Creatinine, Ser: 1.85 mg/dL — ABNORMAL HIGH (ref 0.76–1.27)
GFR calc Af Amer: 41 mL/min/{1.73_m2} — ABNORMAL LOW (ref >59)
GFR calc non Af Amer: 35 mL/min/{1.73_m2} — ABNORMAL LOW (ref >59)
Glucose: 108 mg/dL — ABNORMAL HIGH (ref 65–99)
Potassium: 4.8 mmol/L (ref 3.5–5.2)
Sodium: 138 mmol/L (ref 134–144)

## 2019-04-13 NOTE — Addendum Note (Signed)
Addended by: Kela Millin on: 04/13/2019 11:18 PM   Modules accepted: Orders

## 2019-04-14 ENCOUNTER — Ambulatory Visit: Payer: Self-pay | Admitting: Cardiology

## 2019-04-22 LAB — BASIC METABOLIC PANEL
BUN/Creatinine Ratio: 14 (ref 10–24)
BUN: 23 mg/dL (ref 8–27)
CO2: 21 mmol/L (ref 20–29)
Calcium: 9.5 mg/dL (ref 8.6–10.2)
Chloride: 103 mmol/L (ref 96–106)
Creatinine, Ser: 1.69 mg/dL — ABNORMAL HIGH (ref 0.76–1.27)
GFR calc Af Amer: 45 mL/min/{1.73_m2} — ABNORMAL LOW (ref >59)
GFR calc non Af Amer: 39 mL/min/{1.73_m2} — ABNORMAL LOW (ref >59)
Glucose: 89 mg/dL (ref 65–99)
Potassium: 4.9 mmol/L (ref 3.5–5.2)
Sodium: 138 mmol/L (ref 134–144)

## 2019-04-26 ENCOUNTER — Ambulatory Visit: Payer: Medicare Other | Admitting: Cardiology

## 2019-04-26 ENCOUNTER — Encounter: Payer: Self-pay | Admitting: Cardiology

## 2019-04-26 ENCOUNTER — Other Ambulatory Visit: Payer: Self-pay

## 2019-04-26 VITALS — BP 120/68 | HR 52 | Temp 97.3°F | Ht 72.0 in | Wt 259.8 lb

## 2019-04-26 DIAGNOSIS — I129 Hypertensive chronic kidney disease with stage 1 through stage 4 chronic kidney disease, or unspecified chronic kidney disease: Secondary | ICD-10-CM | POA: Diagnosis not present

## 2019-04-26 DIAGNOSIS — N1831 Chronic kidney disease, stage 3a: Secondary | ICD-10-CM

## 2019-04-26 DIAGNOSIS — I10 Essential (primary) hypertension: Secondary | ICD-10-CM

## 2019-04-26 DIAGNOSIS — E876 Hypokalemia: Secondary | ICD-10-CM

## 2019-04-26 DIAGNOSIS — I2510 Atherosclerotic heart disease of native coronary artery without angina pectoris: Secondary | ICD-10-CM | POA: Diagnosis not present

## 2019-04-26 DIAGNOSIS — I480 Paroxysmal atrial fibrillation: Secondary | ICD-10-CM

## 2019-04-26 MED ORDER — POTASSIUM CHLORIDE 20 MEQ/15ML (10%) PO SOLN: 40.0000 meq | Freq: Every day | ORAL | 6 refills | Status: DC

## 2019-04-26 NOTE — Progress Notes (Signed)
Primary Physician/Referring:  [name], MD  Patient ID: Gregory [name], male    DOB: November 24, 1945, 73 y.o.   MRN: [id number]  Chief Complaint  Patient presents with  . Atrial Fibrillation  . Follow-up    3 weeks   HPI:    Gregory [name]  is a 73 y.o. 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  He presented with palpitations, dyspnea. on 03/18/2019 with minimally elevated cardiac markers, underwent coronary angiography the following day revealing patent diagonal stent, however the ostium of the D2 had 80% stenosis that was unchanged from 2013.  Previously placed circumflex stent was widely patent.  However when he presented to the office on 03/29/2019, stating that he feels similar to hospitalization, he was found to be in new onset atrial fibrillation with rapid ventricular response.  He was also complaining of fatigue and not feeling well.  I had started him on Eliquis, Aldactone for hypertension and also chronic hypokalemia, discontinued aspirin and Plavix and he now presents for follow-up.  States that since last office visit he has felt the best he has in quite a while.  Dyspnea is improved, fatigue is also improved.  He has not had any chest pain.  Tolerating all his medications well.  States that he is having difficulty swallowing potassium supplements as the tablets a very large.  Past Medical History:  Diagnosis Date  . CAD (coronary artery disease)   . COPD (chronic obstructive pulmonary disease) ([place])   . Diabetes mellitus   . Heart attack ([place])   . Hyperlipidemia   . Hypertension   . Neuromuscular disorder ([place])   . SOB (shortness of breath) on exertion    Past Surgical History:  Procedure Laterality Date  . BACK SURGERY    .  CARDIAC CATHETERIZATION    . CORONARY STENT PLACEMENT    . KNEE ARTHROPLASTY    . LEFT HEART CATH AND CORONARY ANGIOGRAPHY N/A 03/19/2019   Procedure: LEFT HEART CATH AND CORONARY ANGIOGRAPHY;  Surgeon: Nigel Mormon, MD;  Location: [facility];  Service: Cardiovascular;  Laterality: N/A;  . LEFT HEART CATHETERIZATION WITH CORONARY ANGIOGRAM N/A 03/03/2012   Procedure: LEFT HEART CATHETERIZATION WITH CORONARY ANGIOGRAM;  Surgeon: Laverda Page, MD;  Location: [facility] CATH [facility];  Service: Cardiovascular;  Laterality: N/A;  . PERCUTANEOUS CORONARY STENT INTERVENTION (PCI-S)  03/03/2012   Procedure: PERCUTANEOUS CORONARY STENT INTERVENTION (PCI-S);  Surgeon: Laverda Page, MD;  Location: [facility] CATH LAB;  Service: Cardiovascular;;   Social History   Socioeconomic History  . Marital status: Married    Spouse name: Not on file  . Number of children: 3  . Years of education: Not on file  . Highest education level: Not on file  Occupational History  . Occupation: retired  Scientific laboratory technician  . Financial resource strain: Not on file  . Food insecurity    Worry: Not on file    Inability: Not on file  . Transportation needs    Medical: Not on file    Non-medical: Not on file  Tobacco Use  . Smoking status: Former Smoker    Packs/day: 1.00    Years: 30.00    Pack years: 30.00    Types: Cigarettes    Quit date: 07/30/2003    Years since quitting: 15.7  .  Smokeless tobacco: Never Used  Substance and Sexual Activity  . Alcohol use: Yes    Alcohol/week: 0.0 standard drinks    Comment: wine rarely  . Drug use: No  . Sexual activity: Not on file  Lifestyle  . Physical activity    Days per week: Not on file    Minutes per session: Not on file  . Stress: Not on file  Relationships  . Social Herbalist on phone: Not on file    Gets together: Not on file    Attends religious service: Not on file    Active member of club or organization: Not on file    Attends meetings of  clubs or organizations: Not on file    Relationship status: Not on file  . Intimate partner violence    Fear of current or ex partner: Not on file    Emotionally abused: Not on file    Physically abused: Not on file    Forced sexual activity: Not on file  Other Topics Concern  . Not on file  Social History Narrative  . Not on file   ROS  Review of Systems  Constitution: Negative for chills, decreased appetite, malaise/fatigue and weight gain.  Cardiovascular: Positive for palpitations. Negative for dyspnea on exertion, leg swelling and syncope.  Respiratory: Positive for cough and shortness of breath.   Endocrine: Negative for cold intolerance.  Hematologic/Lymphatic: Does not bruise/bleed easily.  Musculoskeletal: Positive for muscle cramps (at night). Negative for joint swelling.  Gastrointestinal: Negative for abdominal pain, anorexia, change in bowel habit, hematochezia and melena.  Neurological: Negative for headaches and light-headedness.  Psychiatric/Behavioral: Negative for depression and substance abuse.  All other systems reviewed and are negative.  Objective   Vitals with BMI 04/26/2019 03/29/2019 03/19/2019  Height 6' 0"  6' 0"  -  Weight 259 lbs 13 oz 264 lbs 13 oz -  BMI 16.10 96.04 -  Systolic 540 981 191  Diastolic 68 90 95  Pulse 52 71 -    Blood pressure 120/68, pulse (!) 52, temperature (!) 97.3 F (36.3 C), height 6' (1.829 m), weight 259 lb 12.8 oz (117.8 kg), SpO2 97 %. Body mass index is 35.24 kg/m.   Physical Exam  Constitutional:  He is well-built and moderately obese in no acute distress.  HENT:  Head: Atraumatic.  Eyes: Conjunctivae are normal.  Neck: Neck supple. No JVD present. No thyromegaly present.  Cardiovascular: Intact distal pulses and normal pulses. An irregularly irregular rhythm present. Exam reveals no gallop, no S3 and no S4.  No murmur heard. S1 is variable, S2 is normal.   Pulmonary/Chest: Effort normal and breath sounds normal.   Abdominal: Soft. Bowel sounds are normal.  Musculoskeletal: Normal range of motion.        General: No edema.  Neurological: He is alert.  Skin: Skin is warm and dry.  Psychiatric: He has a normal mood and affect.   Radiology: No results found.  Laboratory examination:   Recent Labs    03/19/19 0641 04/12/19 1135 04/21/19 1204  NA 137 138 138  K 3.2* 4.8 4.9  CL 101 101 103  CO2 26 23 21   GLUCOSE 107* 108* 89  BUN 10 25 23   CREATININE 1.38* 1.85* 1.69*  CALCIUM 8.5* 9.0 9.5  GFRNONAA 50* 35* 39*  GFRAA 58* 41* 45*   CMP Latest Ref Rng & Units 04/21/2019 04/12/2019 03/19/2019  Glucose 65 - 99 mg/dL 89 108(H) 107(H)  BUN 8 - 27  mg/dL 23 25 10   Creatinine 0.76 - 1.27 mg/dL 1.69(H) 1.85(H) 1.38(H)  Sodium 134 - 144 mmol/L 138 138 137  Potassium 3.5 - 5.2 mmol/L 4.9 4.8 3.2(L)  Chloride 96 - 106 mmol/L 103 101 101  CO2 20 - 29 mmol/L 21 23 26   Calcium 8.6 - 10.2 mg/dL 9.5 9.0 8.5(L)   CBC Latest Ref Rng & Units 03/19/2019 03/18/2019 03/04/2012  WBC 4.0 - 10.5 K/uL 7.9 7.5 8.5  Hemoglobin 13.0 - 17.0 g/dL 13.5 14.8 12.4(L)  Hematocrit 39.0 - 52.0 % 39.9 46.2 37.7(L)  Platelets 150 - 400 K/uL 161 184 161   Lipid Panel     Component Value Date/Time   CHOL 148 03/19/2019 0321   TRIG 132 03/19/2019 0321   HDL 32 (L) 03/19/2019 0321   CHOLHDL 4.6 03/19/2019 0321   VLDL 26 03/19/2019 0321   LDLCALC 90 03/19/2019 0321   HEMOGLOBIN A1C Lab Results  Component Value Date   HGBA1C 6.0 (H) 03/18/2019   MPG 125.5 03/18/2019   TSH No results for input(s): TSH in the last 8760 hours. Medications and allergies  No Known Allergies   Prior to Admission medications   Medication Sig Start Date End Date Taking? Authorizing Provider  amLODipine (NORVASC) 5 MG tablet Take 1 tablet (5 mg total) by mouth daily. 11/04/18  Yes Miquel Dunn, NP  aspirin 81 MG tablet Take 81 mg by mouth daily.    Yes [provider]  atorvastatin (LIPITOR) 20 MG tablet Take 1 tablet  (20 mg total) by mouth daily at 6 PM. 03/19/19  Yes Patwardhan, Manish J, MD  carvedilol (COREG) 25 MG tablet Take 25 mg by mouth daily.  03/04/12  Yes Adrian Prows, MD  clopidogrel (PLAVIX) 75 MG tablet Take 1 tablet (75 mg total) by mouth daily. Please take 4 pills on 9/26 AM. Then, take 1 pill daily from 9/27 onwards 03/20/19  Yes Patwardhan, Manish J, MD  hydrALAZINE (APRESOLINE) 100 MG tablet Take 100 mg by mouth 2 (two) times daily.  10/06/16  Yes [provider]  metFORMIN (GLUCOPHAGE) 500 MG tablet Take 500 mg by mouth daily.   Yes [provider]  potassium chloride SA (K-DUR) 20 MEQ tablet Take 2 tablets (40 mEq total) by mouth daily. 03/19/19  Yes Patwardhan, Manish J, MD  Tamsulosin HCl (FLOMAX) 0.4 MG CAPS Take 0.4 mg by mouth daily.   Yes [provider]  valsartan-hydrochlorothiazide (DIOVAN-HCT) 160-25 MG tablet Take 1 tablet by mouth daily.   Yes [provider]     Current Outpatient Medications  Medication Instructions  . amLODipine (NORVASC) 5 mg, Oral, Daily  . apixaban (ELIQUIS) 5 mg, Oral, 2 times daily  . atorvastatin (LIPITOR) 20 mg, Oral, Daily-1800  . carvedilol (COREG) 25 mg, Oral, 2 times daily  . hydrALAZINE (APRESOLINE) 100 mg, Oral, 2 times daily  . metFORMIN (GLUCOPHAGE) 500 mg, Daily  . potassium chloride 20 MEQ/15ML (10%) SOLN 40 mEq, Oral, Daily  . spironolactone (ALDACTONE) 25 mg, Oral, BH-each morning  . tamsulosin (FLOMAX) 0.4 mg, Oral, Daily  . valsartan-hydrochlorothiazide (DIOVAN-HCT) 160-25 MG tablet 1 tablet, Oral, Daily    Cardiac Studies:   Echocardiogram 03/19/2019:    1. Mild basal septal hypertrophy. Moderate inferior hypokinesis. Assessment limited due to frequent PVC's. LVEF 40-45%. Grade 1 diastolic dysfunction. Left ventricular ejection fraction, by visual estimation, is 40 to 45%. The left ventricle has mildly  decreased function. Left ventricular septal wall thickness was mildly increased. There is mildly  increased left ventricular hypertrophy.  2. Mild tricuspid regurgitation. Estimated PASP 30 mmhg.   Coronary Angiography 03/19/2019:  No change from 03/06/2012 LM: Normal LAD: Mid LAD focal 40% stenosis          Ostial diag-2 80% stenosis with TIMI III flow. Unchanged compared to 2013 angiography. Patent mid Diag 2 stent without ISR LCx: Patent mid LCX overlapping stents with 10-20% late lumen loss RCA: CTO Mid 100% occlusion with grade 3 left-to-right collaterals.  Assessment     ICD-10-CM   1. Paroxysmal atrial fibrillation (HCC)  I48.0 EKG 12-Lead    CBC  2. Atherosclerosis of native coronary artery of native heart without angina pectoris  I25.10   3. Essential hypertension  I10   4. Stage 3a chronic kidney disease  N18.31 CMP14+EGFR  5. Hypokalemia  E87.6 potassium chloride 20 MEQ/15ML (10%) SOLN    EKG 04/26/2019: Normal sinus rhythm at rate of 63 bpm, normal axis, poor R-wave progression, cannot exclude anteroseptal infarct old.  Frequent PVCs. (5)   EKG 03/29/2019: Atrial fibrillation with  rapid ventricular response at rate of 115 bpm, normal axis.  Anteroseptal infarct old.  Nonspecific T normality.  Single PVC.  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  He presented to the office on 03/29/2019, stating that he feels similar to hospitalization, he was found to be in new onset atrial fibrillation with rapid ventricular response.  I started him on Aldactone and also Eliquis, he were as he had uncontrolled her origin and also chronic hypokalemia, he presents here for follow-up.  He states that he is feeling the best he has in a while.  This  is one month visit.  His blood pressure is now improved, potassium levels have normalized, on EKG he is back in sinus rhythm.  There is no clinical evidence of heart failure although he does have mild chronic dyspnea.    We will continue present medications, he does has stage III chronic kidney disease but tolerating all his medications well.  I'll like to see him back in 3 months and will obtain labs including CBC, CMP at that time.  With regard to coronary artery disease, he has not had any angina pectoris.  He is also lost 5 pounds in weight over the past one month and appears motivated.   He is having difficulty swallowing 40 mEq of potassium tablets, we will switch him over to oral liquid form of potassium supplement.  Adrian Prows, MD, [facility] 04/26/2019, 4:55 PM [facility]. [place] Pager: [phone] Office: [phone] If no answer Cell [phone]

## 2019-05-06 ENCOUNTER — Other Ambulatory Visit: Payer: Self-pay | Admitting: Cardiology

## 2019-05-07 ENCOUNTER — Other Ambulatory Visit: Payer: Self-pay | Admitting: Cardiology

## 2019-05-07 ENCOUNTER — Telehealth: Payer: Self-pay

## 2019-05-07 DIAGNOSIS — I480 Paroxysmal atrial fibrillation: Secondary | ICD-10-CM

## 2019-05-07 NOTE — Telephone Encounter (Signed)
PT called c/o eliquis being too expensive for him and I told him about pt assistance and he wants to know first if he can just go back to plavix ?

## 2019-05-08 NOTE — Telephone Encounter (Signed)
His risk of stroke is very high. A. Fib. He can try Coumadin if he wants but needs close monitoring and probably better so we can emphasize weight loss

## 2019-05-10 NOTE — Telephone Encounter (Signed)
Start 7.5 mg in the evening daily and INR check on 4-5 days.  Indication A. Fib. Goal INR 2-3

## 2019-05-10 NOTE — Telephone Encounter (Signed)
Pt is okay with that; Please let me know what dose and when he needs to come in for his first check

## 2019-05-11 ENCOUNTER — Other Ambulatory Visit: Payer: Self-pay

## 2019-05-11 DIAGNOSIS — I480 Paroxysmal atrial fibrillation: Secondary | ICD-10-CM

## 2019-05-11 MED ORDER — WARFARIN SODIUM 7.5 MG PO TABS: 7.5000 mg | ORAL_TABLET | Freq: Every day | ORAL | 3 refills | Status: DC

## 2019-05-11 NOTE — Telephone Encounter (Signed)
Pt aware.

## 2019-05-17 ENCOUNTER — Other Ambulatory Visit: Payer: Self-pay

## 2019-05-17 ENCOUNTER — Ambulatory Visit (INDEPENDENT_AMBULATORY_CARE_PROVIDER_SITE_OTHER): Payer: Medicare Other | Admitting: Cardiology

## 2019-05-17 DIAGNOSIS — Z5181 Encounter for therapeutic drug level monitoring: Secondary | ICD-10-CM

## 2019-05-17 DIAGNOSIS — I4821 Permanent atrial fibrillation: Secondary | ICD-10-CM | POA: Diagnosis not present

## 2019-05-17 DIAGNOSIS — I4891 Unspecified atrial fibrillation: Secondary | ICD-10-CM | POA: Insufficient documentation

## 2019-05-17 DIAGNOSIS — Z7901 Long term (current) use of anticoagulants: Secondary | ICD-10-CM

## 2019-05-17 DIAGNOSIS — I4819 Other persistent atrial fibrillation: Secondary | ICD-10-CM | POA: Insufficient documentation

## 2019-05-17 LAB — POCT INR: INR: 3 (ref 2.0–3.0)

## 2019-05-17 NOTE — Progress Notes (Signed)
Ref Range & Units 11:33 86yr ago  INR 2.0 - 3.0 3.0    New maintenance plan:  7.5 mg every day [] No changeStart Over  Maintenance plan weekly total: 52.5 mg Tablets on hand: 7.5 mg   Continue to take 7.5mg  daily and return in 2 weeks. Goal INR 2-3 for A. Fib

## 2019-05-22 ENCOUNTER — Other Ambulatory Visit: Payer: Self-pay | Admitting: Cardiology

## 2019-05-31 ENCOUNTER — Ambulatory Visit (INDEPENDENT_AMBULATORY_CARE_PROVIDER_SITE_OTHER): Payer: Medicare Other | Admitting: Cardiology

## 2019-05-31 ENCOUNTER — Other Ambulatory Visit: Payer: Self-pay

## 2019-05-31 DIAGNOSIS — I4821 Permanent atrial fibrillation: Secondary | ICD-10-CM

## 2019-05-31 DIAGNOSIS — Z7901 Long term (current) use of anticoagulants: Secondary | ICD-10-CM

## 2019-05-31 DIAGNOSIS — Z5181 Encounter for therapeutic drug level monitoring: Secondary | ICD-10-CM

## 2019-05-31 NOTE — Progress Notes (Signed)
INR checked on several machines and noted >8.0. No bleeding reported. Advised to hold Coumadin for now and recheck in 4 days (12/11).

## 2019-06-04 ENCOUNTER — Ambulatory Visit (INDEPENDENT_AMBULATORY_CARE_PROVIDER_SITE_OTHER): Payer: Medicare Other | Admitting: Cardiology

## 2019-06-04 ENCOUNTER — Other Ambulatory Visit: Payer: Self-pay

## 2019-06-04 DIAGNOSIS — Z5181 Encounter for therapeutic drug level monitoring: Secondary | ICD-10-CM

## 2019-06-04 DIAGNOSIS — I4821 Permanent atrial fibrillation: Secondary | ICD-10-CM

## 2019-06-04 DIAGNOSIS — Z7901 Long term (current) use of anticoagulants: Secondary | ICD-10-CM

## 2019-06-04 LAB — POCT INR
INR: 8 — AB (ref 2.0–3.0)
INR: 4.1 — AB (ref 2.0–3.0)

## 2019-06-04 MED ORDER — WARFARIN SODIUM 5 MG PO TABS: 5.0000 mg | ORAL_TABLET | Freq: Every day | ORAL | 3 refills | Status: DC

## 2019-06-11 ENCOUNTER — Ambulatory Visit (INDEPENDENT_AMBULATORY_CARE_PROVIDER_SITE_OTHER): Payer: Medicare Other | Admitting: Cardiology

## 2019-06-11 ENCOUNTER — Other Ambulatory Visit: Payer: Self-pay

## 2019-06-11 DIAGNOSIS — Z7901 Long term (current) use of anticoagulants: Secondary | ICD-10-CM

## 2019-06-11 DIAGNOSIS — I4821 Permanent atrial fibrillation: Secondary | ICD-10-CM

## 2019-06-11 DIAGNOSIS — Z5181 Encounter for therapeutic drug level monitoring: Secondary | ICD-10-CM

## 2019-06-11 LAB — POCT INR: INR: 2.7 (ref 2.0–3.0)

## 2019-06-11 NOTE — Progress Notes (Signed)
Goal: 2.0-3.0 Indication: Afib Today's INR: 2.7 (>8.0 on 12/11. Warfarin held for 3 days, now on 5 mg daily) Current dose:5 mg daily  New dose: Same  Next INR check: 10 days  Vernell Leep, MD

## 2019-06-21 ENCOUNTER — Other Ambulatory Visit: Payer: Self-pay | Admitting: Cardiology

## 2019-06-21 ENCOUNTER — Other Ambulatory Visit: Payer: Self-pay

## 2019-06-21 ENCOUNTER — Ambulatory Visit (INDEPENDENT_AMBULATORY_CARE_PROVIDER_SITE_OTHER): Payer: Medicare Other | Admitting: Cardiology

## 2019-06-21 DIAGNOSIS — I10 Essential (primary) hypertension: Secondary | ICD-10-CM

## 2019-06-21 DIAGNOSIS — Z5181 Encounter for therapeutic drug level monitoring: Secondary | ICD-10-CM | POA: Diagnosis not present

## 2019-06-21 DIAGNOSIS — I4821 Permanent atrial fibrillation: Secondary | ICD-10-CM

## 2019-06-21 DIAGNOSIS — Z7901 Long term (current) use of anticoagulants: Secondary | ICD-10-CM | POA: Diagnosis not present

## 2019-06-21 LAB — POCT INR: INR: 5.1 — AB (ref 2.0–3.0)

## 2019-06-21 NOTE — Progress Notes (Signed)
Goal: 2.0-3.0 Indication: Afib Today's INR: 5.1. No bleeding.  Current dose: 5 mg daily (2.7 on 12/18. No change was made. (>8.0 on 12/11. Warfarin held for 3 days) New dose: Hold for 3 days. Consider 5 mg M,W,F, 2.5 mg all other days upon return. Next INR check: 12/31  Strongly consider NOAC if he continues to have difficulty maintaining therapeutic INR.  Vernell Leep, MD

## 2019-06-24 ENCOUNTER — Ambulatory Visit (INDEPENDENT_AMBULATORY_CARE_PROVIDER_SITE_OTHER): Payer: Medicare Other | Admitting: Cardiology

## 2019-06-24 ENCOUNTER — Other Ambulatory Visit: Payer: Self-pay

## 2019-06-24 DIAGNOSIS — Z7901 Long term (current) use of anticoagulants: Secondary | ICD-10-CM

## 2019-06-24 DIAGNOSIS — Z5181 Encounter for therapeutic drug level monitoring: Secondary | ICD-10-CM | POA: Diagnosis not present

## 2019-06-24 DIAGNOSIS — I4821 Permanent atrial fibrillation: Secondary | ICD-10-CM | POA: Diagnosis not present

## 2019-06-24 LAB — POCT INR: INR: 3.3 — AB (ref 2.0–3.0)

## 2019-06-24 NOTE — Addendum Note (Signed)
Addended by: Gwinda Maine on: 06/24/2019 02:10 PM   Modules accepted: Level of Service

## 2019-06-24 NOTE — Progress Notes (Signed)
INR goal 2-3 for A fib.  INR continues to be elevated at 3.3 despite holding for 3 days. Will continue to hold coumadin today and tomorrow and will start back with 2.5 mg on Sat, Sun, and Tues. 5 mg on M and Wed. Will repeat INR on 01/07

## 2019-06-29 ENCOUNTER — Other Ambulatory Visit: Payer: Self-pay

## 2019-06-29 MED ORDER — ATORVASTATIN CALCIUM 20 MG PO TABS: 20.0000 mg | ORAL_TABLET | Freq: Every day | ORAL | 1 refills | Status: DC

## 2019-07-01 ENCOUNTER — Ambulatory Visit (INDEPENDENT_AMBULATORY_CARE_PROVIDER_SITE_OTHER): Payer: Medicare Other | Admitting: Cardiology

## 2019-07-01 ENCOUNTER — Other Ambulatory Visit: Payer: Self-pay

## 2019-07-01 DIAGNOSIS — I4821 Permanent atrial fibrillation: Secondary | ICD-10-CM | POA: Diagnosis not present

## 2019-07-01 DIAGNOSIS — Z5181 Encounter for therapeutic drug level monitoring: Secondary | ICD-10-CM

## 2019-07-01 DIAGNOSIS — Z7901 Long term (current) use of anticoagulants: Secondary | ICD-10-CM

## 2019-07-01 LAB — POCT INR: INR: 2.5 (ref 2.0–3.0)

## 2019-07-03 NOTE — Patient Instructions (Signed)
2.5 mg daily except Wed 5 mg and recheck in 2 weeks.

## 2019-07-03 NOTE — Progress Notes (Signed)
Ref Range & Units 2 d ago  (07/01/19) 9 d ago  (06/24/19) 12 d ago  (06/21/19) 3 wk ago  (06/11/19) 4 wk ago  (06/04/19) 1 mo ago  (05/31/19) 1 mo ago  (05/17/19)  INR 2.0 - 3.0 2.5  3.3Abnormal   5.1Abnormal   2.7 CM  4.1Abnormal   8.0Abnormal   3.0    New maintenance plan:  5 mg every Wed; 2.5 mg all other days [] No changeStart Over  Maintenance plan weekly total: 20 mg14.3% Tablets on hand: 5 mg  Total dose from past 5 days: 17.5 mg    2.5 mg daily except Wed 5 mg and recheck in 2 weeks.  Goal 2-3 for A. Fib

## 2019-07-15 ENCOUNTER — Ambulatory Visit (INDEPENDENT_AMBULATORY_CARE_PROVIDER_SITE_OTHER): Payer: Medicare Other | Admitting: Cardiology

## 2019-07-15 ENCOUNTER — Other Ambulatory Visit: Payer: Self-pay

## 2019-07-15 DIAGNOSIS — Z5181 Encounter for therapeutic drug level monitoring: Secondary | ICD-10-CM | POA: Diagnosis not present

## 2019-07-15 DIAGNOSIS — I4821 Permanent atrial fibrillation: Secondary | ICD-10-CM | POA: Diagnosis not present

## 2019-07-15 DIAGNOSIS — Z7901 Long term (current) use of anticoagulants: Secondary | ICD-10-CM | POA: Diagnosis not present

## 2019-07-15 LAB — POCT INR: INR: 1.9 — AB (ref 2.0–3.0)

## 2019-07-16 ENCOUNTER — Other Ambulatory Visit: Payer: Self-pay | Admitting: Cardiology

## 2019-07-16 DIAGNOSIS — I10 Essential (primary) hypertension: Secondary | ICD-10-CM

## 2019-07-17 NOTE — Progress Notes (Signed)
2 d ago  (07/15/19) 2 wk ago  (07/01/19) 3 wk ago  (06/24/19) 3 wk ago  (06/21/19)    INR 2.0 - 3.0 1.9Abnormal   2.5  3.3Abnormal   5.1Abnormal     New maintenance plan:  5 mg every Wed, Fri; 2.5 mg all other days [] No changeStart Over  Maintenance plan weekly total: 22.5 mg12.5% Tablets on hand: 5 mg  Total dose from past 7 days: 20 mg    5mg  W,Fri; 2.5 mg all other days; Return 2 weeks

## 2019-07-28 ENCOUNTER — Ambulatory Visit: Payer: Medicare Other | Admitting: Cardiology

## 2019-07-28 LAB — CBC
Hematocrit: 40.9 % (ref 37.5–51.0)
Hemoglobin: 13.2 g/dL (ref 13.0–17.7)
MCH: 27.2 pg (ref 26.6–33.0)
MCHC: 32.3 g/dL (ref 31.5–35.7)
MCV: 84 fL (ref 79–97)
Platelets: 194 10*3/uL (ref 150–450)
RBC: 4.85 x10E6/uL (ref 4.14–5.80)
RDW: 13.3 % (ref 11.6–15.4)
WBC: 6.5 10*3/uL (ref 3.4–10.8)

## 2019-07-28 LAB — CMP14+EGFR
ALT: 8 IU/L (ref 0–44)
AST: 9 IU/L (ref 0–40)
Albumin/Globulin Ratio: 1.1 — ABNORMAL LOW (ref 1.2–2.2)
Albumin: 3.9 g/dL (ref 3.7–4.7)
Alkaline Phosphatase: 117 IU/L (ref 39–117)
BUN/Creatinine Ratio: 8 — ABNORMAL LOW (ref 10–24)
BUN: 15 mg/dL (ref 8–27)
Bilirubin Total: 0.7 mg/dL (ref 0.0–1.2)
CO2: 24 mmol/L (ref 20–29)
Calcium: 9.4 mg/dL (ref 8.6–10.2)
Chloride: 103 mmol/L (ref 96–106)
Creatinine, Ser: 1.9 mg/dL — ABNORMAL HIGH (ref 0.76–1.27)
GFR calc Af Amer: 39 mL/min/{1.73_m2} — ABNORMAL LOW (ref >59)
GFR calc non Af Amer: 34 mL/min/{1.73_m2} — ABNORMAL LOW (ref >59)
Globulin, Total: 3.4 g/dL (ref 1.5–4.5)
Glucose: 96 mg/dL (ref 65–99)
Potassium: 4.2 mmol/L (ref 3.5–5.2)
Sodium: 142 mmol/L (ref 134–144)
Total Protein: 7.3 g/dL (ref 6.0–8.5)

## 2019-07-29 NOTE — Progress Notes (Signed)
Slightly worsening renal function, blood count is normal.

## 2019-07-30 ENCOUNTER — Ambulatory Visit: Payer: Medicare Other | Admitting: Cardiology

## 2019-08-03 ENCOUNTER — Ambulatory Visit: Payer: Medicare Other | Admitting: Cardiology

## 2019-08-04 ENCOUNTER — Other Ambulatory Visit: Payer: Self-pay

## 2019-08-04 ENCOUNTER — Ambulatory Visit (INDEPENDENT_AMBULATORY_CARE_PROVIDER_SITE_OTHER): Payer: Medicare Other | Admitting: Cardiology

## 2019-08-04 ENCOUNTER — Encounter: Payer: Self-pay | Admitting: Cardiology

## 2019-08-04 VITALS — BP 120/67 | HR 61 | Temp 97.1°F | Resp 16 | Ht 72.0 in | Wt 267.6 lb

## 2019-08-04 DIAGNOSIS — I129 Hypertensive chronic kidney disease with stage 1 through stage 4 chronic kidney disease, or unspecified chronic kidney disease: Secondary | ICD-10-CM | POA: Diagnosis not present

## 2019-08-04 DIAGNOSIS — Z7901 Long term (current) use of anticoagulants: Secondary | ICD-10-CM | POA: Diagnosis not present

## 2019-08-04 DIAGNOSIS — I4821 Permanent atrial fibrillation: Secondary | ICD-10-CM

## 2019-08-04 DIAGNOSIS — N1831 Chronic kidney disease, stage 3a: Secondary | ICD-10-CM

## 2019-08-04 DIAGNOSIS — I10 Essential (primary) hypertension: Secondary | ICD-10-CM

## 2019-08-04 DIAGNOSIS — I2510 Atherosclerotic heart disease of native coronary artery without angina pectoris: Secondary | ICD-10-CM

## 2019-08-04 LAB — POCT INR: INR: 1.9 — AB (ref 2.0–3.0)

## 2019-08-04 NOTE — Progress Notes (Signed)
Primary Physician/Referring:  [name], MD  Patient ID: Gregory [name], male    DOB: 06/06/1946, 74 y.o.   MRN: [id number]  Chief Complaint  Patient presents with  . Atrial Fibrillation  . Hypertension   HPI:    Gregory [name]  is a 74 y.o. AA male with chronic stage 3 chronic kidney disease, hypertension, diabetes, hyperlipidemia, coronary artery disease with angina pectoris and stenting to mid D1 and also circumflex in 2013 and occluded RCA, permanent  atrial fibrillation, chronic hypokalemia, chronic diastolic heart failure presents for 3 month f/u.     He is presently doing well and except for chronic dyspnea has no specific complaints.  Denies any fatigue, denies palpitations, tolerating warfarin without bleeding diathesis.  He has occasional tingling and numbness in his fingers especially when he wakes up in the morning.  He does have chronic back pain.  Past Medical History:  Diagnosis Date  . CAD (coronary artery disease)   . COPD (chronic obstructive pulmonary disease) ([place])   . Diabetes mellitus   . Heart attack ([place])   . Hyperlipidemia   . Hypertension   . Neuromuscular disorder ([place])   . SOB (shortness of breath) on exertion    Past Surgical History:  Procedure Laterality Date  . BACK SURGERY    . CARDIAC CATHETERIZATION    . CORONARY STENT PLACEMENT    . KNEE ARTHROPLASTY    . LEFT HEART CATH AND CORONARY ANGIOGRAPHY N/A 03/19/2019   Procedure: LEFT HEART CATH AND CORONARY ANGIOGRAPHY;  Surgeon: Nigel Mormon, MD;  Location: [facility];  Service: Cardiovascular;  Laterality: N/A;  . LEFT HEART CATHETERIZATION WITH CORONARY ANGIOGRAM N/A 03/03/2012   Procedure: LEFT HEART CATHETERIZATION WITH CORONARY ANGIOGRAM;  Surgeon: Laverda Page, MD;  Location: [facility] CATH LAB;  Service: Cardiovascular;  Laterality: N/A;  . PERCUTANEOUS CORONARY STENT INTERVENTION (PCI-S)  03/03/2012   Procedure: PERCUTANEOUS CORONARY STENT INTERVENTION (PCI-S);  Surgeon:  Laverda Page, MD;  Location: [facility] CATH LAB;  Service: Cardiovascular;;   Social History   Socioeconomic History  . Marital status: Married    Spouse name: Not on file  . Number of children: 3  . Years of education: Not on file  . Highest education level: Not on file  Occupational History  . Occupation: retired  Tobacco Use  . Smoking status: Former Smoker    Packs/day: 1.00    Years: 30.00    Pack years: 30.00    Types: Cigarettes    Quit date: 07/30/2003    Years since quitting: 16.0  . Smokeless tobacco: Never Used  Substance and Sexual Activity  . Alcohol use: Yes    Alcohol/week: 0.0 standard drinks    Comment: wine rarely  . Drug use: No  . Sexual activity: Not on file  Other Topics Concern  . Not on file  Social History Narrative  . Not on file   Social Determinants of Health   Financial Resource Strain:   . Difficulty of Paying Living Expenses: Not on file  Food Insecurity:   . Worried About Charity fundraiser in the Last Year: Not on file  . Ran Out of Food in the Last Year: Not on file  Transportation Needs:   . Lack of Transportation (Medical): Not on file  . Lack of Transportation (Non-Medical): Not on file  Physical Activity:   . Days of Exercise per Week: Not on file  . Minutes of Exercise per Session: Not on file  Stress:   . Feeling of Stress : Not on file  Social Connections:   . Frequency of Communication with Friends and Family: Not on file  . Frequency of Social Gatherings with Friends and Family: Not on file  . Attends Religious Services: Not on file  . Active Member of Clubs or Organizations: Not on file  . Attends Archivist Meetings: Not on file  . Marital Status: Not on file  Intimate Partner Violence:   . Fear of Current or Ex-Partner: Not on file  . Emotionally Abused: Not on file  . Physically Abused: Not on file  . Sexually Abused: Not on file   ROS  Review of Systems  HENT: Negative for nosebleeds.     Cardiovascular: Positive for dyspnea on exertion (chronic and stable). Negative for leg swelling.  Musculoskeletal: Positive for back pain (chronic and 3 back operations).  Gastrointestinal: Negative for melena.  Neurological: Positive for paresthesias (fingers).   Objective   Vitals with BMI 08/04/2019 04/26/2019 03/29/2019  Height 6\' 0"  6\' 0"  6\' 0"   Weight 267 lbs 10 oz 259 lbs 13 oz 264 lbs 13 oz  BMI 36.29 53.66 44.03  Systolic 474 259 563  Diastolic 67 68 90  Pulse 61 52 71    Blood pressure 120/67, pulse 61, temperature (!) 97.1 F (36.2 C), temperature source Temporal, resp. rate 16, height 6' (1.829 m), weight 267 lb 9.6 oz (121.4 kg), SpO2 99 %. Body mass index is 36.29 kg/m.   Physical Exam  Constitutional:  He is well-built and moderately obese in no acute distress.  HENT:  Head: Atraumatic.  Eyes: Conjunctivae are normal.  Cardiovascular: Intact distal pulses and normal pulses. An irregularly irregular rhythm present. Exam reveals no gallop, no S3 and no S4.  No murmur heard. S1 is variable, S2 is normal. No JVD, no leg edema  Pulmonary/Chest: Effort normal and breath sounds normal.  Abdominal: Soft. Bowel sounds are normal.  Musculoskeletal:        General: Normal range of motion.     Cervical back: Neck supple.  Neurological: He is alert.   Radiology: No results found.  Laboratory examination:   Recent Labs    04/12/19 1135 04/21/19 1204 07/27/19 1057  NA 138 138 142  K 4.8 4.9 4.2  CL 101 103 103  CO2 23 21 24   GLUCOSE 108* 89 96  BUN 25 23 15   CREATININE 1.85* 1.69* 1.90*  CALCIUM 9.0 9.5 9.4  GFRNONAA 35* 39* 34*  GFRAA 41* 45* 39*   CMP Latest Ref Rng & Units 07/27/2019 04/21/2019 04/12/2019  Glucose 65 - 99 mg/dL 96 89 108(H)  BUN 8 - 27 mg/dL 15 23 25   Creatinine 0.76 - 1.27 mg/dL 1.90(H) 1.69(H) 1.85(H)  Sodium 134 - 144 mmol/L 142 138 138  Potassium 3.5 - 5.2 mmol/L 4.2 4.9 4.8  Chloride 96 - 106 mmol/L 103 103 101  CO2 20 - 29 mmol/L  24 21 23   Calcium 8.6 - 10.2 mg/dL 9.4 9.5 9.0  Total Protein 6.0 - 8.5 g/dL 7.3 - -  Total Bilirubin 0.0 - 1.2 mg/dL 0.7 - -  Alkaline Phos 39 - 117 IU/L 117 - -  AST 0 - 40 IU/L 9 - -  ALT 0 - 44 IU/L 8 - -   CBC Latest Ref Rng & Units 07/27/2019 03/19/2019 03/18/2019  WBC 3.4 - 10.8 x10E3/uL 6.5 7.9 7.5  Hemoglobin 13.0 - 17.7 g/dL 13.2 13.5 14.8  Hematocrit 37.5 - 51.0 %  40.9 39.9 46.2  Platelets 150 - 450 x10E3/uL 194 161 184   Lipid Panel     Component Value Date/Time   CHOL 148 03/19/2019 0321   TRIG 132 03/19/2019 0321   HDL 32 (L) 03/19/2019 0321   CHOLHDL 4.6 03/19/2019 0321   VLDL 26 03/19/2019 0321   LDLCALC 90 03/19/2019 0321   HEMOGLOBIN A1C Lab Results  Component Value Date   HGBA1C 6.0 (H) 03/18/2019   MPG 125.5 03/18/2019   TSH No results for input(s): TSH in the last 8760 hours. Medications and allergies  No Known Allergies   Current Outpatient Medications  Medication Instructions  . amLODipine (NORVASC) 5 mg, Oral, Daily  . atorvastatin (LIPITOR) 20 mg, Oral, Daily-1800  . carvedilol (COREG) 25 mg, Oral, 2 times daily  . hydrALAZINE (APRESOLINE) 100 mg, Oral, 2 times daily  . metFORMIN (GLUCOPHAGE) 500 mg, Daily  . potassium chloride 20 MEQ/15ML (10%) SOLN 40 mEq, Oral, Daily  . spironolactone (ALDACTONE) 25 MG tablet TAKE 1 TABLET BY MOUTH ONCE DAILY IN THE MORNING  . tamsulosin (FLOMAX) 0.4 mg, Oral, Daily  . valsartan-hydrochlorothiazide (DIOVAN-HCT) 160-25 MG tablet 1 tablet, Oral, Daily  . warfarin (COUMADIN) 5 mg, Oral, Daily    Cardiac Studies:   Echocardiogram 03/19/2019:    1. Mild basal septal hypertrophy. Moderate inferior hypokinesis. Assessment limited due to frequent PVC's. LVEF 40-45%. Grade 1 diastolic dysfunction. Left ventricular ejection fraction, by visual estimation, is 40 to 45%. The left ventricle has mildly  decreased function. Left ventricular septal wall thickness was mildly increased. There is mildly increased left  ventricular hypertrophy.  2. Mild tricuspid regurgitation. Estimated PASP 30 mmhg.   Coronary Angiography 03/19/2019:  No change from 03/06/2012 LM: Normal LAD: Mid LAD focal 40% stenosis          Ostial diag-2 80% stenosis with TIMI III flow. Unchanged compared to 2013 angiography. Patent mid Diag 2 stent without ISR LCx: Patent mid LCX overlapping stents with 10-20% late lumen loss RCA: CTO Mid 100% occlusion with grade 3 left-to-right collaterals.  Assessment     ICD-10-CM   1. Permanent atrial fibrillation (HCC)  I48.21 POCT INR  2. Atherosclerosis of native coronary artery of native heart without angina pectoris  I25.10   3. Essential hypertension  I10   4. Stage 3a chronic kidney disease  N18.31     EKG 04/26/2019: Normal sinus rhythm at rate of 63 bpm, normal axis, poor R-wave progression, cannot exclude anteroseptal infarct old.  Frequent PVCs. (5)   EKG 03/29/2019: Atrial fibrillation with  rapid ventricular response at rate of 115 bpm, normal axis.  Anteroseptal infarct old.  Nonspecific T normality.  Single PVC.  Recommendations:   Gregory [name]  is an AA male with chronic stage 3 chronic kidney disease, hypertension, diabetes, hyperlipidemia, coronary artery disease with angina pectoris and stenting to mid D1 and also circumflex in 2013 and occluded RCA, permanent  atrial fibrillation, chronic hypokalemia, chronic diastolic heart failure presents here for follow-up.     He is presently doing well.  Since being on Aldactone, blood pressure has improved and also potassium levels have normalized.  However he does have stage IIIa CKD which is remained stable.  I discussed with him extensively again regarding weight loss.  His dyspnea is likely to obesity hypoventilation and also underlying coronary artery disease and hypertension.  He persist to be in atrial fibrillation and is tolerating anticoagulation with warfarin, INR has been therapeutic.  We will continue to follow in  the  Coumadin clinic.  I will see him back in 6 months for follow-up.  INR goal:  2.0-3.0  TTR:  27.5 % (2.3 mo)  INR used for dosing:  1.9Low  (08/04/2019)  Warfarin maintenance plan:  5 mg (5 mg x 1) every Wed, Fri, Sat; 2.5 mg (5 mg x 0.5) all other days  Weekly warfarin total:  25 mg  Plan last modified:  Mares, Lesley (08/04/2019)  Next INR check:  08/18/2019  Target end date:  Indefinite   Indications  Atrial fibrillation (HCC) [I48.91]  Anticoagulation Episode Summary  INR check location:    Preferred lab:    Send INR reminders to:    Comments:    Description  Coumadin Change today: 5mg  W,F, S and 2.5 mg all other days; Return 2 weeks  Adrian Prows, MD, [facility] 08/04/2019, [facility]

## 2019-08-18 ENCOUNTER — Other Ambulatory Visit: Payer: Self-pay

## 2019-08-18 ENCOUNTER — Ambulatory Visit: Payer: Medicare Other | Admitting: Cardiology

## 2019-08-18 DIAGNOSIS — I4821 Permanent atrial fibrillation: Secondary | ICD-10-CM

## 2019-08-18 LAB — POCT INR: INR: 3.5 — AB (ref 2.0–3.0)

## 2019-08-20 ENCOUNTER — Other Ambulatory Visit: Payer: Self-pay | Admitting: Cardiology

## 2019-08-20 DIAGNOSIS — I10 Essential (primary) hypertension: Secondary | ICD-10-CM

## 2019-08-20 NOTE — Progress Notes (Signed)
INR goal:  2.0-3.0  TTR:  33.4 % (2.8 mo)  INR used for dosing:  3.5High  (08/18/2019)  Warfarin maintenance plan:  5 mg (5 mg x 1) every Tue, Thu; 2.5 mg (5 mg x 0.5) all other days  Weekly warfarin total:  22.5 mg  Plan last modified:  Gaye Alken, [address] (08/18/2019)  Next INR check:  08/30/2019  Target end date:  Indefinite   Decrease dose by 2.5 mg. Will change regimen to 5 mg T and Th; 2.5 mg all other days. Will repeat INR in 10 days.

## 2019-08-30 ENCOUNTER — Other Ambulatory Visit: Payer: Self-pay

## 2019-08-30 ENCOUNTER — Other Ambulatory Visit: Payer: Self-pay | Admitting: Cardiology

## 2019-08-30 ENCOUNTER — Ambulatory Visit: Payer: Medicare Other | Admitting: Cardiology

## 2019-08-30 DIAGNOSIS — I4821 Permanent atrial fibrillation: Secondary | ICD-10-CM

## 2019-08-30 LAB — POCT INR: INR: 2.4 (ref 2.0–3.0)

## 2019-08-30 NOTE — Progress Notes (Signed)
INR goal:  2.0-3.0  TTR:  36.0 % (3.2 mo)  INR used for dosing:  2.4 (08/30/2019)  Warfarin maintenance plan:  5 mg (5 mg x 1) every Tue, Thu; 2.5 mg (5 mg x 0.5) all other days  Weekly warfarin total:  22.5 mg  No change documented:  Mares, Lolita Patella  Plan last modified:  Gaye Alken, CMA (08/18/2019)  Next INR check:  09/13/2019  Target end date:  Indefinite   INR within range today of 2-3 for A fib. Will continue with current regimen. Will repeat in 2 weeks due to frequent fluctuations in INR levels.

## 2019-09-09 ENCOUNTER — Other Ambulatory Visit: Payer: Self-pay

## 2019-09-09 ENCOUNTER — Emergency Department (HOSPITAL_COMMUNITY)
Admission: EM | Admit: 2019-09-09 | Discharge: 2019-09-09 | Disposition: A | Discharge status: Home / Self Care | Payer: Medicare Other | Attending: Emergency Medicine | Admitting: Emergency Medicine

## 2019-09-09 ENCOUNTER — Encounter (HOSPITAL_COMMUNITY): Payer: Self-pay

## 2019-09-09 ENCOUNTER — Emergency Department (HOSPITAL_COMMUNITY): Payer: Medicare Other

## 2019-09-09 DIAGNOSIS — Z79899 Other long term (current) drug therapy: Secondary | ICD-10-CM | POA: Insufficient documentation

## 2019-09-09 DIAGNOSIS — Z7901 Long term (current) use of anticoagulants: Secondary | ICD-10-CM | POA: Insufficient documentation

## 2019-09-09 DIAGNOSIS — J449 Chronic obstructive pulmonary disease, unspecified: Secondary | ICD-10-CM | POA: Insufficient documentation

## 2019-09-09 DIAGNOSIS — R0602 Shortness of breath: Secondary | ICD-10-CM | POA: Insufficient documentation

## 2019-09-09 DIAGNOSIS — Z20822 Contact with and (suspected) exposure to covid-19: Secondary | ICD-10-CM | POA: Insufficient documentation

## 2019-09-09 DIAGNOSIS — I10 Essential (primary) hypertension: Secondary | ICD-10-CM | POA: Diagnosis not present

## 2019-09-09 DIAGNOSIS — I2510 Atherosclerotic heart disease of native coronary artery without angina pectoris: Secondary | ICD-10-CM | POA: Diagnosis not present

## 2019-09-09 DIAGNOSIS — Z87891 Personal history of nicotine dependence: Secondary | ICD-10-CM | POA: Insufficient documentation

## 2019-09-09 LAB — BRAIN NATRIURETIC PEPTIDE: B Natriuretic Peptide: 787.7 pg/mL — ABNORMAL HIGH (ref 0.0–100.0)

## 2019-09-09 LAB — CBC WITH DIFFERENTIAL/PLATELET
Abs Immature Granulocytes: 0.02 10*3/uL (ref 0.00–0.07)
Basophils Absolute: 0.1 10*3/uL (ref 0.0–0.1)
Basophils Relative: 1 %
Eosinophils Absolute: 0.2 10*3/uL (ref 0.0–0.5)
Eosinophils Relative: 3 %
HCT: 40.9 % (ref 39.0–52.0)
Hemoglobin: 12.8 g/dL — ABNORMAL LOW (ref 13.0–17.0)
Immature Granulocytes: 0 %
Lymphocytes Relative: 22 %
Lymphs Abs: 1.6 10*3/uL (ref 0.7–4.0)
MCH: 27.1 pg (ref 26.0–34.0)
MCHC: 31.3 g/dL (ref 30.0–36.0)
MCV: 86.5 fL (ref 80.0–100.0)
Monocytes Absolute: 0.7 10*3/uL (ref 0.1–1.0)
Monocytes Relative: 9 %
Neutro Abs: 4.8 10*3/uL (ref 1.7–7.7)
Neutrophils Relative %: 65 %
Platelets: 166 10*3/uL (ref 150–400)
RBC: 4.73 MIL/uL (ref 4.22–5.81)
RDW: 13.9 % (ref 11.5–15.5)
WBC: 7.4 10*3/uL (ref 4.0–10.5)
nRBC: 0 % (ref 0.0–0.2)

## 2019-09-09 LAB — COMPREHENSIVE METABOLIC PANEL
ALT: 19 U/L (ref 0–44)
AST: 17 U/L (ref 15–41)
Albumin: 3.6 g/dL (ref 3.5–5.0)
Alkaline Phosphatase: 96 U/L (ref 38–126)
Anion gap: 13 (ref 5–15)
BUN: 14 mg/dL (ref 8–23)
CO2: 24 mmol/L (ref 22–32)
Calcium: 9.4 mg/dL (ref 8.9–10.3)
Chloride: 102 mmol/L (ref 98–111)
Creatinine, Ser: 1.73 mg/dL — ABNORMAL HIGH (ref 0.61–1.24)
GFR calc Af Amer: 44 mL/min — ABNORMAL LOW (ref >60)
GFR calc non Af Amer: 38 mL/min — ABNORMAL LOW (ref >60)
Glucose, Bld: 131 mg/dL — ABNORMAL HIGH (ref 70–99)
Potassium: 4.2 mmol/L (ref 3.5–5.1)
Sodium: 139 mmol/L (ref 135–145)
Total Bilirubin: 2.1 mg/dL — ABNORMAL HIGH (ref 0.3–1.2)
Total Protein: 7.6 g/dL (ref 6.5–8.1)

## 2019-09-09 LAB — PROTIME-INR
INR: 2.8 — ABNORMAL HIGH (ref 0.8–1.2)
Prothrombin Time: 29.2 seconds — ABNORMAL HIGH (ref 11.4–15.2)

## 2019-09-09 LAB — RESPIRATORY PANEL BY RT PCR (FLU A&B, COVID)
Influenza A by PCR: NEGATIVE
Influenza B by PCR: NEGATIVE
SARS Coronavirus 2 by RT PCR: NEGATIVE

## 2019-09-09 LAB — TROPONIN I (HIGH SENSITIVITY)
Troponin I (High Sensitivity): 10 ng/L (ref <18)
Troponin I (High Sensitivity): 12 ng/L (ref <18)

## 2019-09-09 MED ORDER — FUROSEMIDE 20 MG PO TABS: 20.0000 mg | ORAL_TABLET | Freq: Every day | ORAL | 0 refills | Status: DC

## 2019-09-09 MED ORDER — MORPHINE SULFATE (PF) 4 MG/ML IV SOLN: 4.0000 mg | Freq: Once | INTRAVENOUS | Status: DC

## 2019-09-09 MED ORDER — FUROSEMIDE 10 MG/ML IJ SOLN
40.0000 mg | Freq: Once | INTRAMUSCULAR | Status: AC
  Administered 2019-09-09: 40 mg via INTRAVENOUS
  Filled 2019-09-09: qty 4

## 2019-09-09 NOTE — ED Provider Notes (Signed)
Blood pressure 140/71, pulse (!) 59, temperature 98.2 F (36.8 C), temperature source Oral, resp. rate 19, height 5\' 11"  (1.803 m), weight 117.9 kg, SpO2 96 %.  Assuming care from Dr. Stark Jock.  In short, Gregory [name] is a 74 y.o. male with a chief complaint of Shortness of Breath and Chest Pain .  Refer to the original H&P for additional details.  The current plan of care is to f/u after lasix.  05:23 PM  Patient given IV Lasix here and is diuresing.  He has filled up a bedside urinal at this point.  He is feeling overall well and ambulatory in the department.  He is not hypoxic or having increased work of breathing on my exam.  We discussed the plan to continue Lasix and call his cardiologist in the morning.  He does have an appointment on Monday already scheduled.  I advised that he call the office tomorrow to make sure they do not want to see him sooner.  Called in a prescription to continue Lasix over the weekend.  Discussed ED return precautions with patient who is in agreement and comfortable with the plan at discharge.     Margette Fast, MD 09/09/19 1724

## 2019-09-09 NOTE — ED Triage Notes (Signed)
Pt reports chest pain/tightness and SOB for the past 2 days, hx of a fib, takes warfarin. HR 125 in triage, resp e.u. at this time. NAD noted

## 2019-09-09 NOTE — ED Notes (Signed)
Patient verbalizes understanding of discharge instructions . Opportunity for questions and answers were provided . Armband removed by staff ,Pt discharged from ED. W/C  offered at D/C  and Declined W/C at D/C and was escorted to lobby by RN.  

## 2019-09-09 NOTE — ED Provider Notes (Signed)
[facility] EMERGENCY DEPARTMENT Provider Note   CSN: [id number] Arrival date & time: 09/09/19  1042     History Chief Complaint  Patient presents with  . Shortness of Breath  . Chest Pain    Gregory [name] is a 74 y.o. male.  Patient is a 74 year old male with past medical history of coronary artery disease, diabetes, COPD, hypertension, hyperlipidemia, and paroxysmal A. fib.  He presents today with complaints of shortness of breath.  This has apparently worsened over the past several days.  It is worse when he ambulates and exerts himself.  He denies any fevers, chills, or cough.  He denies any chest pain or leg swelling.  The history is provided by the patient.  Shortness of Breath Severity:  Moderate Onset quality:  Gradual Duration:  3 days Timing:  Constant Progression:  Worsening Chronicity:  New Context: activity   Context: not URI   Relieved by:  Nothing Worsened by:  Exertion Ineffective treatments:  None tried Associated symptoms: chest pain   Chest Pain Associated symptoms: shortness of breath        Past Medical History:  Diagnosis Date  . CAD (coronary artery disease)   . COPD (chronic obstructive pulmonary disease) ([place])   . Diabetes mellitus   . Heart attack ([place])   . Hyperlipidemia   . Hypertension   . Neuromuscular disorder ([place])   . SOB (shortness of breath) on exertion     Patient Active Problem List   Diagnosis Date Noted  . Atrial fibrillation ([place]) 05/17/2019  . NSTEMI (non-ST elevated myocardial infarction) ([place]) 03/18/2019  . Pain of left hip joint 09/18/2016  . Unilateral primary osteoarthritis, left hip 08/21/2016  . Unilateral primary osteoarthritis, right hip 08/21/2016  . COPD mixed type ([place]) 05/03/2015  . DOE (dyspnea on exertion) 09/23/2012  . COPD (chronic obstructive pulmonary disease) with emphysema ([place]) 09/23/2012  . TOBACCO ABUSE 08/08/2009  . HYPERCHOLESTEROLEMIA  IIA 08/07/2009  . Overweight  08/07/2009  . Essential hypertension 08/07/2009  . CAD, NATIVE VESSEL 08/07/2009  . PALPITATIONS 08/07/2009    Past Surgical History:  Procedure Laterality Date  . BACK SURGERY    . CARDIAC CATHETERIZATION    . CORONARY STENT PLACEMENT    . KNEE ARTHROPLASTY    . LEFT HEART CATH AND CORONARY ANGIOGRAPHY N/A 03/19/2019   Procedure: LEFT HEART CATH AND CORONARY ANGIOGRAPHY;  Surgeon: Nigel Mormon, MD;  Location: [facility];  Service: Cardiovascular;  Laterality: N/A;  . LEFT HEART CATHETERIZATION WITH CORONARY ANGIOGRAM N/A 03/03/2012   Procedure: LEFT HEART CATHETERIZATION WITH CORONARY ANGIOGRAM;  Surgeon: Laverda Page, MD;  Location: [facility] CATH LAB;  Service: Cardiovascular;  Laterality: N/A;  . PERCUTANEOUS CORONARY STENT INTERVENTION (PCI-S)  03/03/2012   Procedure: PERCUTANEOUS CORONARY STENT INTERVENTION (PCI-S);  Surgeon: Laverda Page, MD;  Location: [facility] CATH LAB;  Service: Cardiovascular;;       Family History  Problem Relation Age of Onset  . Hypertension Father   . Hyperlipidemia Father   . Stroke Father   . Hypertension Paternal Grandfather   . Hyperlipidemia Paternal Grandfather   . Diabetes Other        maternal uncles  . Diabetes Sister   . Cancer Mother   . Colon cancer Neg Hx     Social History   Tobacco Use  . Smoking status: Former Smoker    Packs/day: 1.00    Years: 30.00    Pack years: 30.00    Types:  Cigarettes    Quit date: 07/30/2003    Years since quitting: 16.1  . Smokeless tobacco: Never Used  Substance Use Topics  . Alcohol use: Yes    Alcohol/week: 0.0 standard drinks    Comment: wine rarely  . Drug use: No    Home Medications Prior to Admission medications   Medication Sig Start Date End Date Taking? Authorizing Provider  amLODipine (NORVASC) 5 MG tablet Take 1 tablet (5 mg total) by mouth daily. 11/04/18   Miquel Dunn, NP  atorvastatin (LIPITOR) 20 MG tablet TAKE 1 TABLET BY MOUTH ONCE DAILY AT 6 PM.  08/30/19   Adrian Prows, MD  carvedilol (COREG) 25 MG tablet Take 25 mg by mouth 2 (two) times daily. 03/04/12   Adrian Prows, MD  hydrALAZINE (APRESOLINE) 100 MG tablet Take 100 mg by mouth 2 (two) times daily.  10/06/16   [provider]  metFORMIN (GLUCOPHAGE) 500 MG tablet Take 500 mg by mouth daily.    [provider]  potassium chloride 20 MEQ/15ML (10%) SOLN Take 30 mLs (40 mEq total) by mouth daily. 04/26/19   Adrian Prows, MD  spironolactone (ALDACTONE) 25 MG tablet TAKE 1 TABLET BY MOUTH ONCE DAILY IN THE MORNING 08/20/19   Adrian Prows, MD  Tamsulosin HCl (FLOMAX) 0.4 MG CAPS Take 0.4 mg by mouth daily.    [provider]  valsartan-hydrochlorothiazide (DIOVAN-HCT) 160-25 MG tablet Take 1 tablet by mouth daily.    [provider]  warfarin (COUMADIN) 5 MG tablet Take 1 tablet (5 mg total) by mouth daily. 06/04/19   Adrian Prows, MD    Allergies    Patient has no known allergies.  Review of Systems   Review of Systems  Respiratory: Positive for shortness of breath.   Cardiovascular: Positive for chest pain.  All other systems reviewed and are negative.   Physical Exam Updated Vital Signs BP (!) 149/92   Pulse (!) 59   Temp 98.2 F (36.8 C) (Oral)   Resp 20   Ht 5\' 11"  (1.803 m)   Wt 117.9 kg   SpO2 96%   BMI 36.26 kg/m   Physical Exam Vitals and nursing note reviewed.  Constitutional:      General: He is not in acute distress.    Appearance: He is well-developed. He is not diaphoretic.  HENT:     Head: Normocephalic and atraumatic.  Cardiovascular:     Rate and Rhythm: Normal rate and regular rhythm.     Heart sounds: No murmur. No friction rub.  Pulmonary:     Effort: Pulmonary effort is normal. No respiratory distress.     Breath sounds: Examination of the right-lower field reveals rales. Examination of the left-lower field reveals rales. Rales present. No wheezing.     Comments: There are slight rales in the bases bilateral. Abdominal:       General: Bowel sounds are normal. There is no distension.     Palpations: Abdomen is soft.     Tenderness: There is no abdominal tenderness.  Musculoskeletal:        General: Normal range of motion.     Cervical back: Normal range of motion and neck supple.     Right lower leg: Edema present.     Left lower leg: Edema present.     Comments: There is trace edema of both lower extremities.  Skin:    General: Skin is warm and dry.  Neurological:     Mental Status: He is alert  and oriented to person, place, and time.     Coordination: Coordination normal.     ED Results / Procedures / Treatments   Labs (all labs ordered are listed, but only abnormal results are displayed) Labs Reviewed  COMPREHENSIVE METABOLIC PANEL  CBC WITH DIFFERENTIAL/PLATELET  BRAIN NATRIURETIC PEPTIDE  TROPONIN I (HIGH SENSITIVITY)    EKG EKG Interpretation  Date/Time:  Thursday September 09 2019 10:40:39 EDT Ventricular Rate:  123 PR Interval:    QRS Duration: 70 QT Interval:  328 QTC Calculation: 469 R Axis:   64 Text Interpretation: Atrial fibrillation with rapid ventricular response with premature ventricular or aberrantly conducted complexes Low voltage QRS Septal infarct , age undetermined Abnormal ECG Confirmed by Veryl Speak [phone]) on 09/09/2019 11:01:46 AM   Radiology No results found.  Procedures Procedures (including critical care time)  Medications Ordered in ED Medications - No data to display  ED Course  I have reviewed the triage vital signs and the nursing notes.  Pertinent labs & imaging results that were available during my care of the patient were reviewed by me and considered in my medical decision making (see chart for details).    MDM Rules/Calculators/A&P  Patient is a 74 year old male with history of cardiomyopathy, prior stenting, permanent A. fib on Coumadin.  He presents today for evaluation of shortness of breath.  This is worsened over the past several days  and is exacerbated with ambulation and lying flat.  His chest x-ray is unremarkable, but BNP is elevated to nearly 800.  His symptoms and presentation are most consistent with congestive heart failure.  His troponin is negative and laboratory studies are otherwise unremarkable.  His EKG is unchanged.  Patient care discussed with Dr. Einar Gip who is the patient's cardiologist.  He is very familiar with this patient and feels as though IV Lasix and ED observation are indicated.  Patient will be given 40 mg of IV Lasix.  If he diuresis and feels better, he should be okay for discharge.  If not, Dr. Einar Gip will be willing to admit.  Care will be signed out to Dr. Laverta Baltimore at shift change.  He will reassess the patient after Lasix and assist in determining the final disposition.  Final Clinical Impression(s) / ED Diagnoses Final diagnoses:  None    Rx / DC Orders ED Discharge Orders    None       Veryl Speak, MD 09/09/19 1521

## 2019-09-09 NOTE — Discharge Instructions (Signed)
You were seen in the emergency department today with shortness of breath.  You have fluid building up causing your trouble breathing.  We are starting you on Lasix which will cause you to urinate a lot.  Please continue this medication as prescribed for the next 4 days.  You received today's dose and can start the prescription medicine tomorrow.  Please call your cardiologist, Dr. Einar Gip, in the morning to update him on your ED visit.  He can decide if he like to see you in the office tomorrow afternoon or wait until Monday.  If you develop chest pain, worsening shortness of breath, or other severe symptoms you should return to the emergency department immediately for evaluation or call 911.

## 2019-09-10 ENCOUNTER — Telehealth: Payer: Self-pay

## 2019-09-10 NOTE — Telephone Encounter (Signed)
[facility] called, pt calls them three times a week  And reports BP and pulse. His pulse has been low In the 40's the last three days. Today it was 44. They want to know if this is normal for pt. He was also seen in the ED today and has a f/u with you next week

## 2019-09-12 NOTE — Progress Notes (Signed)
Primary Physician/Referring:  [name], MD  Patient ID: Gregory [name], male    DOB: Aug 23, 1945, 74 y.o.   MRN: [id number]  Chief Complaint  Patient presents with  . Shortness of Breath  . Hospitalization Follow-up    Fluid build-up   HPI:    Gregory [name]  is a 74 y.o. 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     He is presently doing well and except for chronic dyspnea has no specific complaints. Dyspnea has improved since discharge. Denies any fatigue, denies palpitations, tolerating warfarin without bleeding diathesis.     Past Medical History:  Diagnosis Date  . CAD (coronary artery disease)   . COPD (chronic obstructive pulmonary disease) ([place])   . Diabetes mellitus   . Heart attack ([place])   . Hyperlipidemia   . Hypertension   . Neuromuscular disorder ([place])   . SOB (shortness of breath) on exertion    Past Surgical History:  Procedure Laterality Date  . BACK SURGERY    . CARDIAC CATHETERIZATION    . CORONARY STENT PLACEMENT    . KNEE ARTHROPLASTY    . LEFT HEART CATH AND CORONARY ANGIOGRAPHY N/A 03/19/2019   Procedure: LEFT HEART CATH AND CORONARY ANGIOGRAPHY;  Surgeon: Nigel Mormon, MD;  Location: [facility];  Service: Cardiovascular;  Laterality: N/A;  . LEFT HEART CATHETERIZATION WITH CORONARY ANGIOGRAM N/A 03/03/2012   Procedure: LEFT HEART CATHETERIZATION WITH CORONARY ANGIOGRAM;  Surgeon: Laverda Page, MD;  Location: [facility];  Service: Cardiovascular;  Laterality: N/A;  . PERCUTANEOUS CORONARY STENT INTERVENTION (PCI-S)  03/03/2012   Procedure: PERCUTANEOUS CORONARY STENT INTERVENTION (PCI-S);  Surgeon: Laverda Page,  MD;  Location: [facility] CATH LAB;  Service: Cardiovascular;;   Social History   Tobacco Use  . Smoking status: Former Smoker    Packs/day: 1.00    Years: 30.00    Pack years: 30.00    Types: Cigarettes    Quit date: 07/30/2003    Years since quitting: 16.1  . Smokeless tobacco: Never Used  Substance Use Topics  . Alcohol use: Yes    Alcohol/week: 0.0 standard drinks    Comment: wine rarely    ROS  Review of Systems  HENT: Negative for nosebleeds.   Cardiovascular: Positive for dyspnea on exertion (chronic and stable) and leg swelling.  Musculoskeletal: Positive for back pain (chronic and 3 back operations).  Gastrointestinal: Negative for melena.  Neurological: Positive for paresthesias (fingers).   Objective   Vitals with BMI 09/13/2019 09/09/2019 09/09/2019  Height 5\' 11"  - -  Weight 256 lbs - -  BMI 38.25 - -  Systolic 053 - 976  Diastolic 61 - 71  Pulse 45 94 -    Blood pressure 118/61, pulse (!) 45, temperature (!) 97.3 F (36.3 C), temperature source Temporal, resp. rate 16, height 5\' 11"  (1.803 m), weight 256 lb (116.1 kg), SpO2 99 %. Body mass index is 35.7 kg/m.   Physical Exam  Constitutional:  He is well-built and moderately obese in no acute distress.  Cardiovascular: Intact distal pulses and normal pulses. An irregularly irregular rhythm present. Exam reveals no gallop, no S3 and no S4.  No murmur heard. S1 is variable, S2 is normal. No  JVD.  2 plus ankle edema, pitting  Pulmonary/Chest: Effort normal and breath sounds normal.  Abdominal: Soft. Bowel sounds are normal.   Radiology: No results found.  Laboratory examination:   Recent Labs    04/21/19 1204 07/27/19 1057 09/09/19 1108  NA 138 142 139  K 4.9 4.2 4.2  CL 103 103 102  CO2 21 24 24   GLUCOSE 89 96 131*  BUN 23 15 14   CREATININE 1.69* 1.90* 1.73*  CALCIUM 9.5 9.4 9.4  GFRNONAA 39* 34* 38*  GFRAA 45* 39* 44*   CMP Latest Ref Rng & Units 09/09/2019 07/27/2019 04/21/2019  Glucose 70 - 99  mg/dL 131(H) 96 89  BUN 8 - 23 mg/dL 14 15 23   Creatinine 0.61 - 1.24 mg/dL 1.73(H) 1.90(H) 1.69(H)  Sodium 135 - 145 mmol/L 139 142 138  Potassium 3.5 - 5.1 mmol/L 4.2 4.2 4.9  Chloride 98 - 111 mmol/L 102 103 103  CO2 22 - 32 mmol/L 24 24 21   Calcium 8.9 - 10.3 mg/dL 9.4 9.4 9.5  Total Protein 6.5 - 8.1 g/dL 7.6 7.3 -  Total Bilirubin 0.3 - 1.2 mg/dL 2.1(H) 0.7 -  Alkaline Phos 38 - 126 U/L 96 117 -  AST 15 - 41 U/L 17 9 -  ALT 0 - 44 U/L 19 8 -   CBC Latest Ref Rng & Units 09/09/2019 07/27/2019 03/19/2019  WBC 4.0 - 10.5 K/uL 7.4 6.5 7.9  Hemoglobin 13.0 - 17.0 g/dL 12.8(L) 13.2 13.5  Hematocrit 39.0 - 52.0 % 40.9 40.9 39.9  Platelets 150 - 400 K/uL 166 194 161   Lipid Panel     Component Value Date/Time   CHOL 148 03/19/2019 0321   TRIG 132 03/19/2019 0321   HDL 32 (L) 03/19/2019 0321   CHOLHDL 4.6 03/19/2019 0321   VLDL 26 03/19/2019 0321   LDLCALC 90 03/19/2019 0321   HEMOGLOBIN A1C Lab Results  Component Value Date   HGBA1C 6.0 (H) 03/18/2019   MPG 125.5 03/18/2019   TSH No results for input(s): TSH in the last 8760 hours. Medications and allergies  No Known Allergies   Current Outpatient Medications  Medication Instructions  . amLODipine (NORVASC) 5 mg, Oral, Daily  . atorvastatin (LIPITOR) 20 MG tablet TAKE 1 TABLET BY MOUTH ONCE DAILY AT 6 PM.  . carvedilol (COREG) 25 mg, Oral, 2 times daily  . furosemide (LASIX) 20 mg, Oral, BH-each morning  . hydrALAZINE (APRESOLINE) 100 mg, Oral, 2 times daily  . metFORMIN (GLUCOPHAGE) 500 mg, Daily  . potassium chloride 20 MEQ/15ML (10%) SOLN 40 mEq, Oral, Daily  . spironolactone (ALDACTONE) 25 MG tablet TAKE 1 TABLET BY MOUTH ONCE DAILY IN THE MORNING  . tamsulosin (FLOMAX) 0.4 mg, Oral, Daily  . valsartan-hydrochlorothiazide (DIOVAN-HCT) 160-25 MG tablet 1 tablet, Oral, Daily  . warfarin (COUMADIN) 5 mg, Oral, Daily    Cardiac Studies:   Echocardiogram 03/19/2019:    1. Mild basal septal hypertrophy. Moderate  inferior hypokinesis. Assessment limited due to frequent PVC's. LVEF 40-45%. Grade 1 diastolic dysfunction. Left ventricular ejection fraction, by visual estimation, is 40 to 45%. The left ventricle has mildly  decreased function. Left ventricular septal wall thickness was mildly increased. There is mildly increased left ventricular hypertrophy.  2. Mild tricuspid regurgitation. Estimated PASP 30 mmhg.   Coronary Angiography 03/19/2019:  No change from 03/06/2012 LM: Normal LAD: Mid LAD focal 40% stenosis          Ostial diag-2 80% stenosis with TIMI III flow. Unchanged compared  to 2013 angiography. Patent mid Diag 2 stent without ISR LCx: Patent mid LCX overlapping stents with 10-20% late lumen loss RCA: CTO Mid 100% occlusion with grade 3 left-to-right collaterals.  EKG:   EKG 09/13/2019: Probably underlying sinus rhythm, normal axis, frequent multifocal PVCs.  Anteroseptal infarct old.  Nonspecific T abnormality.    Repeat EKG 09/13/2019: Probably ectopic atrial rhythm with frequent PVCs in the form of ventricular bigeminy.  Anteroseptal infarct old.  Nonspecific T abnormality.  EKG 04/26/2019: Normal sinus rhythm at rate of 63 bpm, normal axis, poor R-wave progression, cannot exclude anteroseptal infarct old.  Frequent PVCs. (5)   EKG 03/29/2019: Atrial fibrillation with  rapid ventricular response at rate of 115 bpm, normal axis.  Anteroseptal infarct old.  Nonspecific T normality.  Single PVC.  Assessment     ICD-10-CM   1. Acute on chronic diastolic heart failure (HCC)  I50.33 EKG 12-Lead    Brain natriuretic peptide    PCV ECHOCARDIOGRAM COMPLETE  2. Permanent atrial fibrillation (HCC) CHA2DS2-VASc Score is 5.  Yearly risk of stroke: 6.7% (A (will be 74 in 2 months), HTN, Vasc Dz, CHF).   I48.21 POCT INR  3. Atherosclerosis of native coronary artery of native heart without angina pectoris  I25.10   4. Essential hypertension  B52 Basic metabolic panel  5. Stage 3a chronic kidney  disease  N18.31     Recommendations:   Edwen Mclester  is a 74 y.o.  AA male with chronic stage 3 chronic kidney disease, hypertension, diabetes, hyperlipidemia, coronary artery disease with angina pectoris and stenting to mid D1 and also circumflex in 2013 and occluded RCA, ?permanent  atrial fibrillation, chronic hypokalemia, chronic diastolic heart failure presents for post ED visit on 09/09/2019 with acute decompensated CHF.     I reviewed his records from the emergency room, he is now on furosemide 20 mg once a day and I will continue the same.  He does have underlying stage III chronic kidney disease but fortunately has remained stable and is also tolerating Aldactone without which he had developed severe hypokalemia.  Blood pressure is also well controlled.  I will repeat BMP along with BNP.  I will also repeat an echocardiogram.  I do not think he needs a repeat stress test.  Suspect his diet is a major role to play in acute decompensated heart failure.  I discussed with him extensively again regarding weight loss.  His dyspnea is likely to obesity hypoventilation and also underlying coronary artery disease and hypertension.  Today the EKG appears to show underlying sinus rhythm.  Baseline artifact was evident hence difficult to interpret.  He is on anticoagulation with warfarin, INR 3.1, continue present medication.  I like to see him back in 4 weeks for follow-up.  Patient with congestive heart failure, hypertension and stage III chronic disease, he would benefit from being on chronic care management.  Plan: To enroll in the chronic care management.  Adrian Prows, MD, [facility] 09/13/2019, 11:34 AM [facility] [place]

## 2019-09-13 ENCOUNTER — Other Ambulatory Visit: Payer: Self-pay

## 2019-09-13 ENCOUNTER — Encounter: Payer: Self-pay | Admitting: Cardiology

## 2019-09-13 ENCOUNTER — Ambulatory Visit: Payer: Medicare Other | Admitting: Cardiology

## 2019-09-13 VITALS — BP 118/61 | HR 45 | Temp 97.3°F | Resp 16 | Ht 71.0 in | Wt 256.0 lb

## 2019-09-13 DIAGNOSIS — I4821 Permanent atrial fibrillation: Secondary | ICD-10-CM

## 2019-09-13 DIAGNOSIS — N1831 Chronic kidney disease, stage 3a: Secondary | ICD-10-CM

## 2019-09-13 DIAGNOSIS — I2510 Atherosclerotic heart disease of native coronary artery without angina pectoris: Secondary | ICD-10-CM

## 2019-09-13 DIAGNOSIS — I5033 Acute on chronic diastolic (congestive) heart failure: Secondary | ICD-10-CM

## 2019-09-13 DIAGNOSIS — I10 Essential (primary) hypertension: Secondary | ICD-10-CM

## 2019-09-13 LAB — POCT INR: INR: 3.1 — AB (ref 2.0–3.0)

## 2019-09-13 MED ORDER — FUROSEMIDE 20 MG PO TABS: 20.0000 mg | ORAL_TABLET | ORAL | 1 refills | Status: DC

## 2019-09-13 NOTE — Progress Notes (Signed)
Anticoagulation Summary  As of 09/13/2019   INR goal:  2.0-3.0  TTR:  43.0 % (3.6 mo)  INR used for dosing:  3.1 (09/13/2019)  Warfarin maintenance plan:  5 mg (5 mg x 1) every Tue, Thu; 2.5 mg (5 mg x 0.5) all other days  Weekly warfarin total:  22.5 mg  Plan last modified:  Gaye Alken, [place] (08/18/2019)  Next INR check:  09/28/2019  Target end date:  Indefinite   Indications   Atrial fibrillation (HCC) [I48.91]        Anticoagulation Episode Summary    INR check location:     Preferred lab:     Send INR reminders to:     Comments:        PATIENT FINDINGS: INR slightly supratheraputic. Recent hospitalization on 09/09/19 for SOB exacerbation and managed with IV diuresis. Pt reports SOB still present but improving.   PLAN: Pt previously therapeutic on current dose for the last two INR checks. Will continue current weekly dose of 5 mg every Tue/Thurs and 2.5 mg all other days. Recheck in 2 weeks.

## 2019-09-14 LAB — BASIC METABOLIC PANEL
BUN/Creatinine Ratio: 15 (ref 10–24)
BUN: 29 mg/dL — ABNORMAL HIGH (ref 8–27)
CO2: 26 mmol/L (ref 20–29)
Calcium: 9.3 mg/dL (ref 8.6–10.2)
Chloride: 100 mmol/L (ref 96–106)
Creatinine, Ser: 1.97 mg/dL — ABNORMAL HIGH (ref 0.76–1.27)
GFR calc Af Amer: 38 mL/min/{1.73_m2} — ABNORMAL LOW (ref >59)
GFR calc non Af Amer: 33 mL/min/{1.73_m2} — ABNORMAL LOW (ref >59)
Glucose: 92 mg/dL (ref 65–99)
Potassium: 3.7 mmol/L (ref 3.5–5.2)
Sodium: 141 mmol/L (ref 134–144)

## 2019-09-14 LAB — BRAIN NATRIURETIC PEPTIDE: BNP: 452.6 pg/mL — ABNORMAL HIGH (ref 0.0–100.0)

## 2019-09-22 ENCOUNTER — Ambulatory Visit: Payer: Medicare Other

## 2019-09-22 ENCOUNTER — Other Ambulatory Visit: Payer: Self-pay

## 2019-09-22 DIAGNOSIS — I5033 Acute on chronic diastolic (congestive) heart failure: Secondary | ICD-10-CM

## 2019-09-27 ENCOUNTER — Other Ambulatory Visit: Payer: Self-pay

## 2019-09-27 ENCOUNTER — Ambulatory Visit: Payer: Medicare Other | Admitting: Pharmacist

## 2019-09-27 DIAGNOSIS — I4821 Permanent atrial fibrillation: Secondary | ICD-10-CM

## 2019-09-27 LAB — POCT INR: INR: 2.6 (ref 2.0–3.0)

## 2019-09-27 NOTE — Patient Instructions (Signed)
INR at goal. Continue 5mg  Tues/Thurs and 2.5mg  all other days, Return in 3 week

## 2019-09-27 NOTE — Progress Notes (Signed)
Anticoagulation Management Gregory [name] is a 74 y.o. male who reports to the clinic for monitoring of warfarin treatment.    Indication: atrial fibrillation CHA2DS2 Vasc Score 4 (Age 83-74, HTN hx, DM Hx, Vascular disease hx) , HAS-BLED 1 (age>65)  Duration: indefinite Supervising physician: Adrian Prows  Anticoagulation Clinic Visit History:  Patient does not report signs/symptoms of bleeding or thromboembolism   Other recent changes: No changes in diet, medications, lifestyle  Anticoagulation Episode Summary    Current INR goal:  2.0-3.0  TTR:  47.2 % (4.1 mo)  Next INR check:  10/20/2019  INR from last check:  2.6 (09/27/2019)  Weekly max warfarin dose:    Target end date:  Indefinite  INR check location:    Preferred lab:    Send INR reminders to:     Indications   Atrial fibrillation (HCC) [I48.91]       Comments:          No Known Allergies  Current Outpatient Medications:  .  amLODipine (NORVASC) 5 MG tablet, Take 1 tablet (5 mg total) by mouth daily., Disp: 90 tablet, Rfl: 3 .  atorvastatin (LIPITOR) 20 MG tablet, TAKE 1 TABLET BY MOUTH ONCE DAILY AT 6 PM. (Patient taking differently: Take 20 mg by mouth daily at 6 PM. ), Disp: 90 tablet, Rfl: 1 .  carvedilol (COREG) 25 MG tablet, Take 25 mg by mouth 2 (two) times daily., Disp: , Rfl:  .  furosemide (LASIX) 20 MG tablet, Take 1 tablet (20 mg total) by mouth every morning., Disp: 90 tablet, Rfl: 1 .  hydrALAZINE (APRESOLINE) 100 MG tablet, Take 100 mg by mouth 2 (two) times daily. , Disp: , Rfl:  .  metFORMIN (GLUCOPHAGE) 500 MG tablet, Take 500 mg by mouth daily., Disp: , Rfl:  .  potassium chloride 20 MEQ/15ML (10%) SOLN, Take 30 mLs (40 mEq total) by mouth daily., Disp: 940 mL, Rfl: 6 .  spironolactone (ALDACTONE) 25 MG tablet, TAKE 1 TABLET BY MOUTH ONCE DAILY IN THE MORNING (Patient taking differently: Take 25 mg by mouth daily. ), Disp: 30 tablet, Rfl: 1 .  Tamsulosin HCl (FLOMAX) 0.4 MG CAPS, Take 0.4 mg by mouth  daily., Disp: , Rfl:  .  valsartan-hydrochlorothiazide (DIOVAN-HCT) 160-25 MG tablet, Take 1 tablet by mouth daily., Disp: , Rfl:  .  warfarin (COUMADIN) 5 MG tablet, Take 1 tablet (5 mg total) by mouth daily. (Patient taking differently: Take 2.5-5 mg by mouth See admin instructions. Take 1 tablet (5mg ) on Tues and Thurs and take 1/2 tablet (2.5mg ) all other days), Disp: 90 tablet, Rfl: 3  Current Facility-Administered Medications:  .  0.9 %  sodium chloride infusion, 500 mL, Intravenous, Continuous, Pyrtle, [name], MD Past Medical History:  Diagnosis Date  . CAD (coronary artery disease)   . COPD (chronic obstructive pulmonary disease) ([place])   . Diabetes mellitus   . Heart attack ([place])   . Hyperlipidemia   . Hypertension   . Neuromuscular disorder ([place])   . SOB (shortness of breath) on exertion     ASSESSMENT  Recent Results: The most recent result is correlated with 22.5 mg per week:  Lab Results  Component Value Date   INR 2.6 09/27/2019   INR 3.1 (A) 09/13/2019   INR 2.8 (H) 09/09/2019    Anticoagulation Dosing: Description   INR at goal. Continue taking 5mg  Tues/Thurs and 2.5mg  all other days, Return in 3 week      INR today: Therapeutic. Pt was slightly  supratheraputic last check, but returns within therapeutic range today.   PLAN Weekly dose was unchanged. Continue taking 5 mg every Tues, Thurs and 2.5 mg all other days. Recheck INR in 3 weeks w/ OV  There are no Patient Instructions on file for this visit. Patient advised to contact clinic or seek medical attention if signs/symptoms of bleeding or thromboembolism occur.  Patient verbalized understanding by repeating back information and was advised to contact me if further medication-related questions arise.   Follow-up Return in about 23 days (around 10/20/2019).  Alysia Penna, PharmD  15 minutes spent face-to-face with the patient during the encounter. 50% of time spent on education, including signs/sx  bleeding and clotting, as well as food and drug interactions with warfarin. 50% of time was spent on fingerprick POC INR sample collection,processing, results determination, and documentation

## 2019-10-15 ENCOUNTER — Other Ambulatory Visit: Payer: Self-pay | Admitting: Cardiology

## 2019-10-15 DIAGNOSIS — I10 Essential (primary) hypertension: Secondary | ICD-10-CM

## 2019-10-18 NOTE — Progress Notes (Deleted)
Primary Physician/Referring:  [name], MD  Patient ID: Gregory [name], male    DOB: 02/22/1946, 74 y.o.   MRN: [id number]  No chief complaint on file.  HPI:    Gregory [name]  is a 74 y.o. 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    *** He is presently doing well and except for chronic dyspnea has no specific complaints. Dyspnea has improved since discharge. Denies any fatigue, denies palpitations, tolerating warfarin without bleeding diathesis.    ***  Past Medical History:  Diagnosis Date  . CAD (coronary artery disease)   . COPD (chronic obstructive pulmonary disease) ([place])   . Diabetes mellitus   . Heart attack ([place])   . Hyperlipidemia   . Hypertension   . Neuromuscular disorder ([place])   . SOB (shortness of breath) on exertion    Past Surgical History:  Procedure Laterality Date  . BACK SURGERY    . CARDIAC CATHETERIZATION    . CORONARY STENT PLACEMENT    . KNEE ARTHROPLASTY    . LEFT HEART CATH AND CORONARY ANGIOGRAPHY N/A 03/19/2019   Procedure: LEFT HEART CATH AND CORONARY ANGIOGRAPHY;  Surgeon: Nigel Mormon, MD;  Location: [place] [facility];  Service: Cardiovascular;  Laterality: N/A;  . LEFT HEART CATHETERIZATION WITH CORONARY ANGIOGRAM N/A 03/03/2012   Procedure: LEFT HEART CATHETERIZATION WITH CORONARY ANGIOGRAM;  Surgeon: Laverda Page, MD;  [facility] C[facility];  Service: Cardiovascular;  Laterality: N/A;  . PERCUTANEOUS CORONARY STENT INTERVENTION (PCI-S)  03/03/2012   Procedure: PERCUTANEOUS CORONARY STENT INTERVENTION (PCI-S);  Surgeon: Laverda Page, MD;  Location: [facility] CATH LAB;  Service: Cardiovascular;;   Social History    Tobacco Use  . Smoking status: Former Smoker    Packs/day: 1.00    Years: 30.00    Pack years: 30.00    Types: Cigarettes    Quit date: 07/30/2003    Years since quitting: 16.2  . Smokeless tobacco: Never Used  Substance Use Topics  . Alcohol use: Yes    Alcohol/week: 0.0 standard drinks    Comment: wine rarely    Marital Status: Married  ROS  Review of Systems  HENT: Negative for nosebleeds.   Cardiovascular: Positive for dyspnea on exertion (chronic and stable) and leg swelling.  Musculoskeletal: Positive for back pain (chronic and 3 back operations).  Gastrointestinal: Negative for melena.  Neurological: Positive for paresthesias (fingers).   Objective   Vitals with BMI 09/13/2019 09/09/2019 09/09/2019  Height 5\' 11"  - -  Weight 256 lbs - -  BMI 95.62 - -  Systolic 130 - 865  Diastolic 61 - 71  Pulse 45 94 -    There were no vitals taken for this visit. There is no height or weight on file to calculate BMI.   Physical Exam  Constitutional:  He is well-built and moderately obese in no acute distress.  Cardiovascular: Intact distal pulses and normal pulses. An irregularly irregular rhythm present. Exam reveals no gallop, no S3 and no S4.  No murmur heard. S1 is variable, S2 is normal. No JVD.  2 plus ankle edema, pitting  Pulmonary/Chest: Effort normal and breath sounds normal.  Abdominal: Soft. Bowel sounds are normal.   Laboratory examination:   Recent  Labs    07/27/19 1057 09/09/19 1108 09/13/19 1310  NA 142 139 141  K 4.2 4.2 3.7  CL 103 102 100  CO2 24 24 26   GLUCOSE 96 131* 92  BUN 15 14 29*  CREATININE 1.90* 1.73* 1.97*  CALCIUM 9.4 9.4 9.3  GFRNONAA 34* 38* 33*  GFRAA 39* 44* 38*   CMP Latest Ref Rng & Units 09/13/2019 09/09/2019 07/27/2019  Glucose 65 - 99 mg/dL 92 131(H) 96  BUN 8 - 27 mg/dL 29(H) 14 15  Creatinine 0.76 - 1.27 mg/dL 1.97(H) 1.73(H) 1.90(H)  Sodium 134 - 144 mmol/L 141 139 142  Potassium 3.5 - 5.2 mmol/L 3.7 4.2 4.2   Chloride 96 - 106 mmol/L 100 102 103  CO2 20 - 29 mmol/L 26 24 24   Calcium 8.6 - 10.2 mg/dL 9.3 9.4 9.4  Total Protein 6.5 - 8.1 g/dL - 7.6 7.3  Total Bilirubin 0.3 - 1.2 mg/dL - 2.1(H) 0.7  Alkaline Phos 38 - 126 U/L - 96 117  AST 15 - 41 U/L - 17 9  ALT 0 - 44 U/L - 19 8   CBC Latest Ref Rng & Units 09/09/2019 07/27/2019 03/19/2019  WBC 4.0 - 10.5 K/uL 7.4 6.5 7.9  Hemoglobin 13.0 - 17.0 g/dL 12.8(L) 13.2 13.5  Hematocrit 39.0 - 52.0 % 40.9 40.9 39.9  Platelets 150 - 400 K/uL 166 194 161   Lipid Panel     Component Value Date/Time   CHOL 148 03/19/2019 0321   TRIG 132 03/19/2019 0321   HDL 32 (L) 03/19/2019 0321   CHOLHDL 4.6 03/19/2019 0321   VLDL 26 03/19/2019 0321   LDLCALC 90 03/19/2019 0321   HEMOGLOBIN A1C Lab Results  Component Value Date   HGBA1C 6.0 (H) 03/18/2019   MPG 125.5 03/18/2019   TSH No results for input(s): TSH in the last 8760 hours.   External Labs: ***  Medications and allergies  No Known Allergies   Current Outpatient Medications  Medication Instructions  . amLODipine (NORVASC) 5 mg, Oral, Daily  . atorvastatin (LIPITOR) 20 MG tablet TAKE 1 TABLET BY MOUTH ONCE DAILY AT 6 PM.  . carvedilol (COREG) 25 mg, Oral, 2 times daily  . furosemide (LASIX) 20 mg, Oral, BH-each morning  . hydrALAZINE (APRESOLINE) 100 mg, Oral, 2 times daily  . metFORMIN (GLUCOPHAGE) 500 mg, Daily  . potassium chloride 20 MEQ/15ML (10%) SOLN 40 mEq, Oral, Daily  . spironolactone (ALDACTONE) 25 MG tablet TAKE 1 TABLET BY MOUTH ONCE DAILY IN THE MORNING  . tamsulosin (FLOMAX) 0.4 mg, Oral, Daily  . valsartan-hydrochlorothiazide (DIOVAN-HCT) 160-25 MG tablet 1 tablet, Oral, Daily  . warfarin (COUMADIN) 5 mg, Oral, Daily    Radiology:   No results found.  Cardiac Studies:   *** Coronary Angiography 03/19/2019:  No change from 03/06/2012: LM: Normal LAD: Mid LAD focal 40% stenosis          Ostial diag-2 80% stenosis with TIMI III flow. Unchanged compared to 2013  angiography. Patent mid Diag 2 stent without ISR LCx: Patent mid LCX overlapping stents with 10-20% late lumen loss RCA: CTO Mid 100% occlusion with grade 3 left-to-right collaterals.  Echocardiogram 03/19/2019:  1. Mild basal septal hypertrophy. Moderate inferior hypokinesis. Assessment limited due to frequent PVC's. LVEF 40-45%. Grade 1 diastolic dysfunction. Left ventricular ejection fraction, by visual estimation, is 40 to 45%. The left ventricle has mildly decreased function. Left ventricular septal wall thickness was mildly increased. There is mildly increased left ventricular hypertrophy. 2. Mild  tricuspid regurgitation. Estimated PASP 30 mmhg.   ***Echocardiogram 09/22/2019:  Left ventricle cavity is normal in size. Mild concentric hypertrophy of the left ventricle. Normal global wall motion. Mildly depressed LV systolic function with visual EF 40-45%. Could not evaluate diastolic function, patient probably in Afib. Left atrial cavity is severely dilated. LA vol index 67 ml/m2. Trileaflet aortic valve. Trace aortic stenosis. Trace aortic regurgitation. Mild aortic valve leaflet calcification.  Moderate (Grade II) mitral regurgitation.  Moderate tricuspid regurgitation. Moderate pulmonary hypertension.  Estimated pulmonary artery systolic pressure is 50 mmHg. Estimated RA pressure 10-15 mmHg.  Compared to previous study on 03/19/2019, there is significant increase in  PASP from 30 mmHg.   EKG ***  09/13/2019: Probably underlying sinus rhythm, normal axis, frequent multifocal PVCs.  Anteroseptal infarct old.  Nonspecific T abnormality.    09/13/2019: Probably ectopic atrial rhythm with frequent PVCs in the form of ventricular bigeminy.  Anteroseptal infarct old.  Nonspecific T abnormality.  04/26/2019: Normal sinus rhythm at rate of 63 bpm, normal axis, poor R-wave progression, cannot exclude anteroseptal infarct old.  Frequent PVCs. (5)   03/29/2019: Atrial fibrillation with  rapid  ventricular response at rate of 115 bpm, normal axis.  Anteroseptal infarct old.  Nonspecific T normality.  Single PVC.  Assessment   ***   ICD-10-CM   1. Acute on chronic diastolic heart failure (HCC)  I50.33   2. Permanent atrial fibrillation (HCC)  I48.21    CHA2DS2-VASc Score is 5.  Yearly risk of stroke: 6.7% (A (will be 74 in 2 months), HTN, Vasc Dz, CHF)  3. Atherosclerosis of native coronary artery of native heart without angina pectoris  I25.10   4. Essential hypertension  I10   5. Stage 3a chronic kidney disease  N18.31     Recommendations:   ***Gregory [name]  is a 74 y.o.  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     I reviewed his records from the emergency room, he is now on furosemide 20 mg once a day and I will continue the same.  He does have underlying stage III chronic kidney disease but fortunately has remained stable and is also tolerating Aldactone without which he had developed severe hypokalemia.  Blood pressure is also well controlled.  I will repeat BMP along with BNP.  I will also repeat an echocardiogram.  I do not think he needs a repeat stress test.  Suspect his diet is a major role to play in acute decompensated heart failure.  I discussed with him extensively again regarding weight loss.  His dyspnea is likely to obesity hypoventilation and also underlying coronary artery disease and hypertension.  Today the EKG appears to show underlying sinus rhythm.  Baseline artifact was evident hence difficult to interpret.  He is on anticoagulation with warfarin, INR 3.1, continue present medication.  I like to see him back in 4 weeks for follow-up.  Patient  with congestive heart failure, hypertension and stage III chronic disease, he would benefit from being on chronic care management.  Plan: To enroll in the chronic care management. ***  Adrian Prows, MD, [facility] 10/18/2019, 2:54 PM [facility]. [place] Pager: [phone] Office: [phone] If no answer Cell [phone]

## 2019-10-20 ENCOUNTER — Ambulatory Visit: Payer: Medicare Other | Admitting: Cardiology

## 2019-10-21 ENCOUNTER — Other Ambulatory Visit: Payer: Self-pay | Admitting: Cardiology

## 2019-11-02 ENCOUNTER — Telehealth: Payer: Self-pay

## 2019-11-02 ENCOUNTER — Other Ambulatory Visit: Payer: Self-pay

## 2019-11-02 MED ORDER — VALSARTAN-HYDROCHLOROTHIAZIDE 160-25 MG PO TABS: 1.0000 | ORAL_TABLET | Freq: Every day | ORAL | 1 refills | Status: DC

## 2019-11-02 NOTE — Telephone Encounter (Signed)
Pt called requesting refill on his Valsartan med. Please call pt to confirm med and pharmacy. [phone]  Thanks

## 2019-11-03 NOTE — Progress Notes (Signed)
Primary Physician/Referring:  [name], MD  Patient ID: Gregory [name], male    DOB: 02-14-1946, 74 y.o.   MRN: [id number]  Chief Complaint  Patient presents with  . Atrial Fibrillation  . Congestive Heart Failure   HPI:    Gregory [name]  is a 74 y.o. 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     States that he is trying his best to make changes to his diet, has lost a couple of pounds since last office visit 6 weeks ago.  No PND or orthopnea.  No further leg edema.  Tolerating Coumadin without bleeding diathesis.   Past Medical History:  Diagnosis Date  . CAD (coronary artery disease)   . COPD (chronic obstructive pulmonary disease) ([place])   . Diabetes mellitus   . Heart attack ([place])   . Hyperlipidemia   . Hypertension   . Neuromuscular disorder ([place])   . SOB (shortness of breath) on exertion    Past Surgical History:  Procedure Laterality Date  . BACK SURGERY    . CARDIAC CATHETERIZATION    . CORONARY STENT PLACEMENT    . KNEE ARTHROPLASTY    . LEFT HEART CATH AND CORONARY ANGIOGRAPHY N/A 03/19/2019   Procedure: LEFT HEART CATH AND CORONARY ANGIOGRAPHY;  Surgeon: Nigel Mormon, MD;  Location: [place] [facility];  Service: Cardiovascular;  Laterality: N/A;  . LEFT HEART CATHETERIZATION WITH CORONARY ANGIOGRAM N/A 03/03/2012   Procedure: LEFT HEART CATHETERIZATION WITH CORONARY ANGIOGRAM;  Surgeon: Laverda Page, MD;  Location: [facility] CATH LAB;  Service: Cardiovascular;  Laterality: N/A;  . PERCUTANEOUS CORONARY STENT INTERVENTION (PCI-S)  03/03/2012   Procedure: PERCUTANEOUS CORONARY STENT INTERVENTION (PCI-S);  Surgeon: Laverda Page, MD;  Location:  [facility] CATH LAB;  Service: Cardiovascular;;   Social History   Tobacco Use  . Smoking status: Former Smoker    Packs/day: 1.00    Years: 30.00    Pack years: 30.00    Types: Cigarettes    Quit date: 07/30/2003    Years since quitting: 16.2  . Smokeless tobacco: Never Used  Substance Use Topics  . Alcohol use: Yes    Alcohol/week: 0.0 standard drinks    Comment: wine rarely    Marital Status: Married  ROS  Review of Systems  HENT: Negative for nosebleeds.   Cardiovascular: Positive for dyspnea on exertion (chronic and stable). Negative for leg swelling.  Musculoskeletal: Positive for back pain (chronic and 3 back operations).  Gastrointestinal: Negative for melena.  Neurological: Positive for paresthesias (fingers).   Objective   Vitals with BMI 11/04/2019 09/13/2019 09/09/2019  Height 5\' 11"  5\' 11"  -  Weight 253 lbs 256 lbs -  BMI 38.2 50.53 -  Systolic 976 734 -  Diastolic 70 61 -  Pulse 56 45 94    Blood pressure 113/70, pulse (!) 56, height 5\' 11"  (1.803 m), weight 253 lb (114.8 kg), SpO2 99 %. Body mass index is 35.29 kg/m.   Physical Exam  Constitutional:  He is well-built and moderately obese in no acute distress.  Cardiovascular: Intact distal pulses and normal pulses. An irregularly irregular rhythm present. Exam reveals no gallop, no S3 and no S4.  No murmur heard. S1 is variable, S2 is normal.  No JVD.  1 plus ankle edema, pitting  Pulmonary/Chest: Effort normal and breath sounds normal.  Abdominal: Soft. Bowel sounds are normal.   Laboratory examination:   Recent Labs    07/27/19 1057 09/09/19 1108 09/13/19 1310  NA 142 139 141  K 4.2 4.2 3.7  CL 103 102 100  CO2 24 24 26   GLUCOSE 96 131* 92  BUN 15 14 29*  CREATININE 1.90* 1.73* 1.97*  CALCIUM 9.4 9.4 9.3  GFRNONAA 34* 38* 33*  GFRAA 39* 44* 38*   CMP Latest Ref Rng & Units 09/13/2019 09/09/2019 07/27/2019  Glucose 65 - 99 mg/dL 92 131(H) 96  BUN 8 - 27 mg/dL 29(H) 14 15  Creatinine 0.76 - 1.27  mg/dL 1.97(H) 1.73(H) 1.90(H)  Sodium 134 - 144 mmol/L 141 139 142  Potassium 3.5 - 5.2 mmol/L 3.7 4.2 4.2  Chloride 96 - 106 mmol/L 100 102 103  CO2 20 - 29 mmol/L 26 24 24   Calcium 8.6 - 10.2 mg/dL 9.3 9.4 9.4  Total Protein 6.5 - 8.1 g/dL - 7.6 7.3  Total Bilirubin 0.3 - 1.2 mg/dL - 2.1(H) 0.7  Alkaline Phos 38 - 126 U/L - 96 117  AST 15 - 41 U/L - 17 9  ALT 0 - 44 U/L - 19 8   CBC Latest Ref Rng & Units 09/09/2019 07/27/2019 03/19/2019  WBC 4.0 - 10.5 K/uL 7.4 6.5 7.9  Hemoglobin 13.0 - 17.0 g/dL 12.8(L) 13.2 13.5  Hematocrit 39.0 - 52.0 % 40.9 40.9 39.9  Platelets 150 - 400 K/uL 166 194 161   Lipid Panel     Component Value Date/Time   CHOL 148 03/19/2019 0321   TRIG 132 03/19/2019 0321   HDL 32 (L) 03/19/2019 0321   CHOLHDL 4.6 03/19/2019 0321   VLDL 26 03/19/2019 0321   LDLCALC 90 03/19/2019 0321   HEMOGLOBIN A1C Lab Results  Component Value Date   HGBA1C 6.0 (H) 03/18/2019   MPG 125.5 03/18/2019   BNP (last 3 results) Recent Labs    09/09/19 1108 09/13/19 1310  BNP 787.7* 452.6*    External Labs:   Lab Results  Component Value Date   INR 2.0 11/04/2019   INR 2.6 09/27/2019   INR 3.1 (A) 09/13/2019   Lipid Panel  08/23/2019  Cholesterol, total 107.000 m 08/23/2019 Triglycerides 109.000 08/23/2019 HDL 25 MG/DL 08/23/2019 LDL 60.000 mg 08/23/2019  A1C 5.700 % 08/23/2019  Glucose Random 92.000 09/13/2019 MicroAlbumin Urine 38.000 08/30/2019 MicroAlbumin/Creat 27.0 MG/ 08/30/2019  BUN 29.000 09/13/2019 Creatinine, Serum 1.970 09/13/2019  TSH 4.350 micr 08/23/2019  PSA 0.777 ng/ 08/23/2019  Medications and allergies  No Known Allergies   Current Outpatient Medications  Medication Instructions  . amLODipine (NORVASC) 5 MG tablet TAKE 1 TABLET BY MOUTH  DAILY  . atorvastatin (LIPITOR) 20 MG tablet TAKE 1 TABLET BY MOUTH ONCE DAILY AT 6 PM.  . carvedilol (COREG) 25 mg, Oral, 2 times daily  . furosemide (LASIX) 20 mg, Oral, BH-each morning  . hydrALAZINE (APRESOLINE)  100 mg, Oral, 2 times daily  . metFORMIN (GLUCOPHAGE) 500 mg, Daily  . potassium chloride 20 MEQ/15ML (10%) SOLN 40 mEq, Oral, Daily  . spironolactone (ALDACTONE) 25 MG tablet TAKE 1 TABLET BY MOUTH ONCE DAILY IN THE MORNING  . tamsulosin (FLOMAX) 0.4 mg, Oral, Daily  . valsartan-hydrochlorothiazide (DIOVAN-HCT) 160-25 MG tablet 1 tablet, Oral, Daily  . warfarin (COUMADIN) 5 mg, Oral, Daily    Radiology:   No results found.  Cardiac Studies:   Coronary  Angiography 03/19/2019:  No change from 03/06/2012: LM: Normal LAD: Mid LAD focal 40% stenosis          Ostial diag-2 80% stenosis with TIMI III flow. Unchanged compared to 2013 angiography. Patent mid Diag 2 stent without ISR LCx: Patent mid LCX overlapping stents with 10-20% late lumen loss RCA: CTO Mid 100% occlusion with grade 3 left-to-right collaterals.   Echocardiogram 09/22/2019:  Left ventricle cavity is normal in size. Mild concentric hypertrophy of the left ventricle. Normal global wall motion. Mildly depressed LV systolic function with visual EF 40-45%. Could not evaluate diastolic function, patient probably in Afib. Left atrial cavity is severely dilated. LA vol index 67 ml/m2. Trileaflet aortic valve. Trace aortic stenosis. Trace aortic regurgitation. Mild aortic valve leaflet calcification.  Moderate (Grade II) mitral regurgitation.  Moderate tricuspid regurgitation. Moderate pulmonary hypertension.  Estimated pulmonary artery systolic pressure is 50 mmHg. Estimated RA pressure 10-15 mmHg.  Compared to previous study on 03/19/2019, there is significant increase in PASP from 30 mmHg.   EKG  EKG 11/04/2019: Atrial fibrillation with controlled ventricular response at the rate of 70 bpm, normal axis, poor R wave progression, cannot exclude anteroseptal infarct old.  Nonspecific T abnormality.  PVCs (3).  Compared to prior EKG on 09/13/2019, ectopic atrial rhythm replaced by atrial fibrillation.   03/29/2019: Atrial  fibrillation with  rapid ventricular response at rate of 115 bpm, normal axis.  Anteroseptal infarct old.  Nonspecific T normality.  Single PVC.  Assessment      ICD-10-CM   1. Chronic combined systolic and diastolic heart failure (HCC)  I50.42   2. Permanent atrial fibrillation (HCC)  I48.21 EKG 12-Lead    POCT INR   CHA2DS2-VASc Score is 5.  Yearly risk of stroke: 6.7% (A (will be 74 in 2 months), HTN, Vasc Dz, CHF)  3. Atherosclerosis of native coronary artery of native heart without angina pectoris  I25.10   4. Essential hypertension  I10   5. Stage 3a chronic kidney disease  N18.31      No orders of the defined types were placed in this encounter.  There are no discontinued medications.   Recommendations:   Gregory [name]  is a 74 y.o.  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     This is a 6-week office visit since last office visit he has made lifestyle changes, he lost about 2 to 3 pounds in weight, blood pressure is well controlled and his leg edema is essentially resolved.  Continue furosemide 20 mg daily for now.  Renal failure is remained stable.  I reviewed his labs, BNP was still elevated previously but symptomatically today has improved significantly with regard to shortness of breath, leg edema.  He is on appropriate medical therapy, lipids are well controlled, no changes were done today.  I will see him back in 3 months for follow-up of CHF. Will enroll the patient in Remote Patient Monitoring and Principal Care Management as patient is high risk for hospitalization and complications from underlying medical conditions.  Adrian Prows, MD, [facility] 11/04/2019, 12:52  PM [facility]. [place] Office: [phone]

## 2019-11-04 ENCOUNTER — Ambulatory Visit: Payer: Medicare Other | Admitting: Cardiology

## 2019-11-04 ENCOUNTER — Other Ambulatory Visit: Payer: Self-pay

## 2019-11-04 ENCOUNTER — Encounter: Payer: Self-pay | Admitting: Cardiology

## 2019-11-04 VITALS — BP 113/70 | HR 56 | Ht 71.0 in | Wt 253.0 lb

## 2019-11-04 DIAGNOSIS — N1831 Chronic kidney disease, stage 3a: Secondary | ICD-10-CM

## 2019-11-04 DIAGNOSIS — I10 Essential (primary) hypertension: Secondary | ICD-10-CM

## 2019-11-04 DIAGNOSIS — I4821 Permanent atrial fibrillation: Secondary | ICD-10-CM

## 2019-11-04 DIAGNOSIS — I2510 Atherosclerotic heart disease of native coronary artery without angina pectoris: Secondary | ICD-10-CM

## 2019-11-04 DIAGNOSIS — I5042 Chronic combined systolic (congestive) and diastolic (congestive) heart failure: Secondary | ICD-10-CM

## 2019-11-04 LAB — POCT INR: INR: 2 (ref 2.0–3.0)

## 2019-11-04 NOTE — Patient Instructions (Signed)
INR at goal. Continue taking 5mg  Tues/Thurs and 2.5mg  all other days, Return in 3 week

## 2019-11-16 ENCOUNTER — Other Ambulatory Visit: Payer: Self-pay | Admitting: Cardiology

## 2019-11-16 DIAGNOSIS — I10 Essential (primary) hypertension: Secondary | ICD-10-CM

## 2019-11-23 ENCOUNTER — Ambulatory Visit: Payer: Medicare Other | Admitting: Pharmacist

## 2019-11-23 ENCOUNTER — Other Ambulatory Visit: Payer: Self-pay

## 2019-11-23 DIAGNOSIS — I4821 Permanent atrial fibrillation: Secondary | ICD-10-CM

## 2019-11-23 DIAGNOSIS — Z7901 Long term (current) use of anticoagulants: Secondary | ICD-10-CM

## 2019-11-23 DIAGNOSIS — Z5181 Encounter for therapeutic drug level monitoring: Secondary | ICD-10-CM

## 2019-11-23 LAB — POCT INR: INR: 2.3 (ref 2.0–3.0)

## 2019-11-23 NOTE — Patient Instructions (Signed)
INR at goal. Continue taking 5mg  Tues/Thurs and 2.5mg  all other days, Return in 4 week

## 2019-11-23 NOTE — Progress Notes (Signed)
Anticoagulation Management Gregory [name] is a 74 y.o. male who reports to the clinic for monitoring of warfarin treatment.    Indication: atrial fibrillation CHA2DS2 Vasc Score 4 (Age 5-74, HTN hx, DM Hx, Vascular disease hx) , HAS-BLED 1 (age>65)  Duration: indefinite Supervising physician: Adrian Prows  Anticoagulation Clinic Visit History:  Patient does not report signs/symptoms of bleeding or thromboembolism   Other recent changes: No changes in diet, medications, lifestyle. Reports having a small salad and 1 serving of beer over the weekend. Not planing on continuing.   Anticoagulation Episode Summary    Current INR goal:  2.0-3.0  TTR:  63.9 % (6 mo)  Next INR check:  12/21/2019  INR from last check:  2.3 (11/23/2019)  Weekly max warfarin dose:    Target end date:  Indefinite  INR check location:    Preferred lab:    Send INR reminders to:     Indications   Atrial fibrillation (HCC) [I48.91]       Comments:          No Known Allergies  Current Outpatient Medications:  .  amLODipine (NORVASC) 5 MG tablet, TAKE 1 TABLET BY MOUTH  DAILY, Disp: 90 tablet, Rfl: 0 .  atorvastatin (LIPITOR) 20 MG tablet, TAKE 1 TABLET BY MOUTH ONCE DAILY AT 6 PM. (Patient taking differently: Take 20 mg by mouth daily at 6 PM. ), Disp: 90 tablet, Rfl: 1 .  carvedilol (COREG) 25 MG tablet, Take 25 mg by mouth 2 (two) times daily., Disp: , Rfl:  .  furosemide (LASIX) 20 MG tablet, Take 1 tablet (20 mg total) by mouth every morning., Disp: 90 tablet, Rfl: 1 .  hydrALAZINE (APRESOLINE) 100 MG tablet, Take 100 mg by mouth 2 (two) times daily. , Disp: , Rfl:  .  metFORMIN (GLUCOPHAGE) 500 MG tablet, Take 500 mg by mouth daily., Disp: , Rfl:  .  potassium chloride 20 MEQ/15ML (10%) SOLN, Take 30 mLs (40 mEq total) by mouth daily. (Patient taking differently: Take 40 mEq by mouth as needed. ), Disp: 940 mL, Rfl: 6 .  spironolactone (ALDACTONE) 25 MG tablet, TAKE 1 TABLET BY MOUTH ONCE DAILY IN THE  MORNING, Disp: 90 tablet, Rfl: 1 .  Tamsulosin HCl (FLOMAX) 0.4 MG CAPS, Take 0.4 mg by mouth daily., Disp: , Rfl:  .  valsartan-hydrochlorothiazide (DIOVAN-HCT) 160-25 MG tablet, Take 1 tablet by mouth daily., Disp: 90 tablet, Rfl: 1 .  warfarin (COUMADIN) 5 MG tablet, Take 1 tablet (5 mg total) by mouth daily. (Patient taking differently: Take 2.5-5 mg by mouth See admin instructions. Take 1 tablet (5mg ) on Tues and Thurs and take 1/2 tablet (2.5mg ) all other days), Disp: 90 tablet, Rfl: 3  Current Facility-Administered Medications:  .  0.9 %  sodium chloride infusion, 500 mL, Intravenous, Continuous, Pyrtle, [name], MD Past Medical History:  Diagnosis Date  . CAD (coronary artery disease)   . COPD (chronic obstructive pulmonary disease) ([place])   . Diabetes mellitus   . Heart attack ([place])   . Hyperlipidemia   . Hypertension   . Neuromuscular disorder ([place])   . SOB (shortness of breath) on exertion     ASSESSMENT  Recent Results: The most recent result is correlated with 22.5 mg per week:  Lab Results  Component Value Date   INR 2.3 11/23/2019   INR 2.0 11/04/2019   INR 2.6 09/27/2019    Anticoagulation Dosing: Description   INR at goal. Continue taking 5mg  Tues/Thurs and 2.5mg  all other  days, Return in 4 week      INR today: Therapeutic. Pt remains therapeutic on current dose. No other relevant changes in diet, medications, or lifestyle. No bleeding or thromboembolic event.   PLAN Weekly dose was unchanged. Continue taking 5 mg every Tues and Thurs and 2.5 mg all other days. Recheck INR in 4 weeks  Patient Instructions  INR at goal. Continue taking 5mg  Tues/Thurs and 2.5mg  all other days, Return in 4 week  Patient advised to contact clinic or seek medical attention if signs/symptoms of bleeding or thromboembolism occur.  Patient verbalized understanding by repeating back information and was advised to contact me if further medication-related questions arise.    Follow-up Return in about 4 weeks (around 12/21/2019).  Alysia Penna, PharmD  15 minutes spent face-to-face with the patient during the encounter. 50% of time spent on education, including signs/sx bleeding and clotting, as well as food and drug interactions with warfarin. 50% of time was spent on fingerprick POC INR sample collection,processing, results determination, and documentation

## 2019-12-21 ENCOUNTER — Other Ambulatory Visit: Payer: Self-pay

## 2019-12-21 ENCOUNTER — Ambulatory Visit: Payer: Medicare Other | Admitting: Pharmacist

## 2019-12-21 DIAGNOSIS — Z7901 Long term (current) use of anticoagulants: Secondary | ICD-10-CM | POA: Insufficient documentation

## 2019-12-21 DIAGNOSIS — Z5181 Encounter for therapeutic drug level monitoring: Secondary | ICD-10-CM | POA: Insufficient documentation

## 2019-12-21 DIAGNOSIS — I4891 Unspecified atrial fibrillation: Secondary | ICD-10-CM

## 2019-12-21 LAB — POCT INR: INR: 2 (ref 2.0–3.0)

## 2019-12-21 NOTE — Progress Notes (Signed)
Anticoagulation Management Gregory [name] is a 74 y.o. male who reports to the clinic for monitoring of warfarin treatment.    Indication: atrial fibrillation CHA2DS2 Vasc Score 4 (Age 55-74, HTN hx, DM Hx, Vascular disease hx) , HAS-BLED 1 (age>65)  Duration: indefinite Supervising physician: Adrian Prows  Anticoagulation Clinic Visit History:  Patient does not report signs/symptoms of bleeding or thromboembolism   Other recent changes: No changes in diet, medications, lifestyle. Reports having more sugary foods and desserts lately.    Anticoagulation Episode Summary    Current INR goal:  2.0-3.0  TTR:  68.7 % (6.9 mo)  Next INR check:  01/04/2020  INR from last check:  2.0 (12/21/2019)  Weekly max warfarin dose:    Target end date:  Indefinite  INR check location:    Preferred lab:    Send INR reminders to:     Indications   Atrial fibrillation (HCC) [I48.91] Monitoring for long-term anticoagulant use [Z51.81 Z79.01]       Comments:          No Known Allergies  Current Outpatient Medications:  .  amLODipine (NORVASC) 5 MG tablet, TAKE 1 TABLET BY MOUTH  DAILY, Disp: 90 tablet, Rfl: 0 .  atorvastatin (LIPITOR) 20 MG tablet, TAKE 1 TABLET BY MOUTH ONCE DAILY AT 6 PM. (Patient taking differently: Take 20 mg by mouth daily at 6 PM. ), Disp: 90 tablet, Rfl: 1 .  carvedilol (COREG) 25 MG tablet, Take 25 mg by mouth 2 (two) times daily., Disp: , Rfl:  .  furosemide (LASIX) 20 MG tablet, Take 1 tablet (20 mg total) by mouth every morning., Disp: 90 tablet, Rfl: 1 .  hydrALAZINE (APRESOLINE) 100 MG tablet, Take 100 mg by mouth 2 (two) times daily. , Disp: , Rfl:  .  metFORMIN (GLUCOPHAGE) 500 MG tablet, Take 500 mg by mouth daily., Disp: , Rfl:  .  potassium chloride 20 MEQ/15ML (10%) SOLN, Take 30 mLs (40 mEq total) by mouth daily. (Patient taking differently: Take 40 mEq by mouth as needed. ), Disp: 940 mL, Rfl: 6 .  spironolactone (ALDACTONE) 25 MG tablet, TAKE 1 TABLET BY MOUTH  ONCE DAILY IN THE MORNING, Disp: 90 tablet, Rfl: 1 .  Tamsulosin HCl (FLOMAX) 0.4 MG CAPS, Take 0.4 mg by mouth daily., Disp: , Rfl:  .  valsartan-hydrochlorothiazide (DIOVAN-HCT) 160-25 MG tablet, Take 1 tablet by mouth daily., Disp: 90 tablet, Rfl: 1 .  warfarin (COUMADIN) 5 MG tablet, Take 1 tablet (5 mg total) by mouth daily. (Patient taking differently: Take 2.5-5 mg by mouth See admin instructions. Take 1 tablet (5mg ) on Tues and Thurs and take 1/2 tablet (2.5mg ) all other days), Disp: 90 tablet, Rfl: 3  Current Facility-Administered Medications:  .  0.9 %  sodium chloride infusion, 500 mL, Intravenous, Continuous, Pyrtle, [name], MD Past Medical History:  Diagnosis Date  . CAD (coronary artery disease)   . COPD (chronic obstructive pulmonary disease) ([place])   . Diabetes mellitus   . Heart attack ([place])   . Hyperlipidemia   . Hypertension   . Neuromuscular disorder ([place])   . SOB (shortness of breath) on exertion     ASSESSMENT  Recent Results: The most recent result is correlated with 22.5 mg per week:  Lab Results  Component Value Date   INR 2.0 12/21/2019   INR 2.3 11/23/2019   INR 2.0 11/04/2019    Anticoagulation Dosing: Description   INR at goal. Increase dose to 5mg  Tues,Thurs, and Sat and 2.5mg   all other days, Return in 2 week      INR today: Therapeutic. INR remains at the lower limit of therapeutic range over the past three visits. Pt reports to have increased appetite recently. Will increase weekly dose to ensure pt remains closer to the median of the therapeutic range. Will continue close monitoring to ensure pt is able to tolerate the new dose and doesn't have bleeding or bruising symptoms.   PLAN Weekly dose was increased by 11%. Increase weekly dose to 5 mg every Tues, Thurs and Sat and 2.5 mg all other days. Recheck INR in 2 weeks. Provided pt with [employer] and Warfarin handout.   Patient Instructions  INSTRUCTIONS: INR at goal. Increase dose to 5mg   Tues,Thurs, and Sat and 2.5mg  all other days, Return in 2 week  Vitamin K Foods and Warfarin Warfarin is a blood thinner (anticoagulant). Anticoagulant medicines help prevent the formation of blood clots. These medicines work by decreasing the activity of vitamin K, which promotes normal blood clotting. When you take warfarin, problems can occur from suddenly increasing or decreasing the amount of vitamin K that you eat from one day to the next. Problems may include:  Blood clots.  Bleeding. What general guidelines do I need to follow? To avoid problems when taking warfarin:  Eat a balanced diet that includes: ? Fresh fruits and vegetables. ? Whole grains. ? Low-fat dairy products. ? Lean proteins, such as fish, eggs, and lean cuts of meat.  Keep your intake of vitamin K consistent from day to day. To do this: ? Avoid eating large amounts of vitamin K one day and low amounts of vitamin K the next day. ? If you take a multivitamin that contains vitamin K, be sure to take it every day. ? Know which foods contain vitamin K. Use the lists below to understand serving sizes and the amount of vitamin K in one serving.  Avoid major changes in your diet. If you are going to change your diet, talk with your health care provider before making changes.  Work with a Financial planner (dietitian) to develop a meal plan that works best for you.  High vitamin K foods Foods that are high in vitamin K contain more than 100 mcg (micrograms) per serving. These include:  Broccoli (cooked) -  cup has 110 mcg.  Brussels sprouts (cooked) -  cup has 109 mcg.  Greens, beet (cooked) -  cup has 350 mcg.  Greens, collard (cooked) -  cup has 418 mcg.  Greens, turnip (cooked) -  cup has 265 mcg.  Green onions or scallions -  cup has 105 mcg.  Kale (fresh or frozen) -  cup has 531 mcg.  Parsley (raw) - 10 sprigs has 164 mcg.  Spinach (cooked) -  cup has 444 mcg.  Swiss chard (cooked) -   cup has 287 mcg. Moderate vitamin K foods Foods that have a moderate amount of vitamin K contain 25-100 mcg per serving. These include:  Asparagus (cooked) - 5 spears have 38 mcg.  Black-eyed peas (dried) -  cup has 32 mcg.  Cabbage (cooked) -  cup has 37 mcg.  Kiwi fruit - 1 medium has 31 mcg.  Lettuce - 1 cup has 57-63 mcg.  Okra (frozen) -  cup has 44 mcg.  Prunes (dried) - 5 prunes have 25 mcg.  Watercress (raw) - 1 cup has 85 mcg. Low vitamin K foods Foods low in vitamin K contain less than 25 mcg per serving. These  include:  Artichoke - 1 medium has 18 mcg.  Avocado - 1 oz. has 6 mcg.  Blueberries -  cup has 14 mcg.  Cabbage (raw) -  cup has 21 mcg.  Carrots (cooked) -  cup has 11 mcg.  Cauliflower (raw) -  cup has 11 mcg.  Cucumber with peel (raw) -  cup has 9 mcg.  Grapes -  cup has 12 mcg.  Mango - 1 medium has 9 mcg.  Nuts - 1 oz. has 15 mcg.  Pear - 1 medium has 8 mcg.  Peas (cooked) -  cup has 19 mcg.  Pickles - 1 spear has 14 mcg.  Pumpkin seeds - 1 oz. has 13 mcg.  Sauerkraut (canned) -  cup has 16 mcg.  Soybeans (cooked) -  cup has 16 mcg.  Tomato (raw) - 1 medium has 10 mcg.  Tomato sauce -  cup has 17 mcg. Vitamin K-free foods If a food contain less than 5 mcg per serving, it is considered to have no vitamin K. These foods include:  Bread and cereal products.  Cheese.  Eggs.  Fish and shellfish.  Meat and poultry.  Milk and dairy products.  Sunflower seeds. Actual amounts of vitamin K in foods may be different depending on processing. Talk with your dietitian about what foods you can eat and what foods you should avoid. This information is not intended to replace advice given to you by your health care provider. Make sure you discuss any questions you have with your health care provider. Document Revised: 05/23/2017 Document Reviewed: 09/13/2015 Elsevier Patient Education  2020 [employer].   Patient  advised to contact clinic or seek medical attention if signs/symptoms of bleeding or thromboembolism occur.  Patient verbalized understanding by repeating back information and was advised to contact me if further medication-related questions arise.   Follow-up Return in about 2 weeks (around 01/04/2020).  Alysia Penna, PharmD  15 minutes spent face-to-face with the patient during the encounter. 50% of time spent on education, including signs/sx bleeding and clotting, as well as food and drug interactions with warfarin. 50% of time was spent on fingerprick POC INR sample collection,processing, results determination, and documentation

## 2019-12-21 NOTE — Patient Instructions (Addendum)
INSTRUCTIONS: INR at goal. Increase dose to 5mg  Tues,Thurs, and Sat and 2.5mg  all other days, Return in 2 week  Vitamin K Foods and Warfarin Warfarin is a blood thinner (anticoagulant). Anticoagulant medicines help prevent the formation of blood clots. These medicines work by decreasing the activity of vitamin K, which promotes normal blood clotting. When you take warfarin, problems can occur from suddenly increasing or decreasing the amount of vitamin K that you eat from one day to the next. Problems may include:  Blood clots.  Bleeding. What general guidelines do I need to follow? To avoid problems when taking warfarin:  Eat a balanced diet that includes: ? Fresh fruits and vegetables. ? Whole grains. ? Low-fat dairy products. ? Lean proteins, such as fish, eggs, and lean cuts of meat.  Keep your intake of vitamin K consistent from day to day. To do this: ? Avoid eating large amounts of vitamin K one day and low amounts of vitamin K the next day. ? If you take a multivitamin that contains vitamin K, be sure to take it every day. ? Know which foods contain vitamin K. Use the lists below to understand serving sizes and the amount of vitamin K in one serving.  Avoid major changes in your diet. If you are going to change your diet, talk with your health care provider before making changes.  Work with a Financial planner (dietitian) to develop a meal plan that works best for you.  High vitamin K foods Foods that are high in vitamin K contain more than 100 mcg (micrograms) per serving. These include:  Broccoli (cooked) -  cup has 110 mcg.  Brussels sprouts (cooked) -  cup has 109 mcg.  Greens, beet (cooked) -  cup has 350 mcg.  Greens, collard (cooked) -  cup has 418 mcg.  Greens, turnip (cooked) -  cup has 265 mcg.  Green onions or scallions -  cup has 105 mcg.  Kale (fresh or frozen) -  cup has 531 mcg.  Parsley (raw) - 10 sprigs has 164 mcg.  Spinach (cooked) -   cup has 444 mcg.  Swiss chard (cooked) -  cup has 287 mcg. Moderate vitamin K foods Foods that have a moderate amount of vitamin K contain 25-100 mcg per serving. These include:  Asparagus (cooked) - 5 spears have 38 mcg.  Black-eyed peas (dried) -  cup has 32 mcg.  Cabbage (cooked) -  cup has 37 mcg.  Kiwi fruit - 1 medium has 31 mcg.  Lettuce - 1 cup has 57-63 mcg.  Okra (frozen) -  cup has 44 mcg.  Prunes (dried) - 5 prunes have 25 mcg.  Watercress (raw) - 1 cup has 85 mcg. Low vitamin K foods Foods low in vitamin K contain less than 25 mcg per serving. These include:  Artichoke - 1 medium has 18 mcg.  Avocado - 1 oz. has 6 mcg.  Blueberries -  cup has 14 mcg.  Cabbage (raw) -  cup has 21 mcg.  Carrots (cooked) -  cup has 11 mcg.  Cauliflower (raw) -  cup has 11 mcg.  Cucumber with peel (raw) -  cup has 9 mcg.  Grapes -  cup has 12 mcg.  Mango - 1 medium has 9 mcg.  Nuts - 1 oz. has 15 mcg.  Pear - 1 medium has 8 mcg.  Peas (cooked) -  cup has 19 mcg.  Pickles - 1 spear has 14 mcg.  Pumpkin seeds - 1 oz.  has 13 mcg.  Sauerkraut (canned) -  cup has 16 mcg.  Soybeans (cooked) -  cup has 16 mcg.  Tomato (raw) - 1 medium has 10 mcg.  Tomato sauce -  cup has 17 mcg. Vitamin K-free foods If a food contain less than 5 mcg per serving, it is considered to have no vitamin K. These foods include:  Bread and cereal products.  Cheese.  Eggs.  Fish and shellfish.  Meat and poultry.  Milk and dairy products.  Sunflower seeds. Actual amounts of vitamin K in foods may be different depending on processing. Talk with your dietitian about what foods you can eat and what foods you should avoid. This information is not intended to replace advice given to you by your health care provider. Make sure you discuss any questions you have with your health care provider. Document Revised: 05/23/2017 Document Reviewed: 09/13/2015 Elsevier Patient  Education  2020 [employer].

## 2020-01-04 ENCOUNTER — Ambulatory Visit: Payer: Medicare Other | Admitting: Pharmacist

## 2020-01-04 ENCOUNTER — Other Ambulatory Visit: Payer: Self-pay

## 2020-01-04 DIAGNOSIS — Z5181 Encounter for therapeutic drug level monitoring: Secondary | ICD-10-CM

## 2020-01-04 DIAGNOSIS — Z7901 Long term (current) use of anticoagulants: Secondary | ICD-10-CM

## 2020-01-04 DIAGNOSIS — I4891 Unspecified atrial fibrillation: Secondary | ICD-10-CM

## 2020-01-04 LAB — POCT INR: INR: 2.5 (ref 2.0–3.0)

## 2020-01-04 NOTE — Progress Notes (Signed)
Anticoagulation Management Gregory [name] is a 74 y.o. male who reports to the clinic for monitoring of warfarin treatment.    Indication: atrial fibrillation CHA2DS2 Vasc Score 4 (Age 2-74, HTN hx, DM Hx, Vascular disease hx) , HAS-BLED 1 (age>65)  Duration: indefinite Supervising physician: Adrian Prows  Anticoagulation Clinic Visit History:  Patient does not report signs/symptoms of bleeding or thromboembolism   Other recent changes: No changes in diet, medications, lifestyle. Complains of chronic back pain.   Anticoagulation Episode Summary    Current INR goal:  2.0-3.0  TTR:  70.7 % (7.4 mo)  Next INR check:  01/31/2020  INR from last check:  2.5 (01/04/2020)  Weekly max warfarin dose:    Target end date:  Indefinite  INR check location:    Preferred lab:    Send INR reminders to:     Indications   Atrial fibrillation (HCC) [I48.91] Monitoring for long-term anticoagulant use [Z51.81 Z79.01]       Comments:          No Known Allergies  Current Outpatient Medications:  .  amLODipine (NORVASC) 5 MG tablet, TAKE 1 TABLET BY MOUTH  DAILY, Disp: 90 tablet, Rfl: 0 .  atorvastatin (LIPITOR) 20 MG tablet, TAKE 1 TABLET BY MOUTH ONCE DAILY AT 6 PM. (Patient taking differently: Take 20 mg by mouth daily at 6 PM. ), Disp: 90 tablet, Rfl: 1 .  carvedilol (COREG) 25 MG tablet, Take 25 mg by mouth 2 (two) times daily., Disp: , Rfl:  .  furosemide (LASIX) 20 MG tablet, Take 1 tablet (20 mg total) by mouth every morning., Disp: 90 tablet, Rfl: 1 .  hydrALAZINE (APRESOLINE) 100 MG tablet, Take 100 mg by mouth 2 (two) times daily. , Disp: , Rfl:  .  metFORMIN (GLUCOPHAGE) 500 MG tablet, Take 500 mg by mouth daily., Disp: , Rfl:  .  potassium chloride 20 MEQ/15ML (10%) SOLN, Take 30 mLs (40 mEq total) by mouth daily. (Patient taking differently: Take 40 mEq by mouth as needed. ), Disp: 940 mL, Rfl: 6 .  spironolactone (ALDACTONE) 25 MG tablet, TAKE 1 TABLET BY MOUTH ONCE DAILY IN THE MORNING,  Disp: 90 tablet, Rfl: 1 .  Tamsulosin HCl (FLOMAX) 0.4 MG CAPS, Take 0.4 mg by mouth daily., Disp: , Rfl:  .  valsartan-hydrochlorothiazide (DIOVAN-HCT) 160-25 MG tablet, Take 1 tablet by mouth daily., Disp: 90 tablet, Rfl: 1 .  warfarin (COUMADIN) 5 MG tablet, Take 1 tablet (5 mg total) by mouth daily. (Patient taking differently: Take 2.5-5 mg by mouth See admin instructions. Take 1 tablet (5mg ) on Tues and Thurs and take 1/2 tablet (2.5mg ) all other days), Disp: 90 tablet, Rfl: 3  Current Facility-Administered Medications:  .  0.9 %  sodium chloride infusion, 500 mL, Intravenous, Continuous, Pyrtle, [name], MD Past Medical History:  Diagnosis Date  . CAD (coronary artery disease)   . COPD (chronic obstructive pulmonary disease) ([place])   . Diabetes mellitus   . Heart attack ([place])   . Hyperlipidemia   . Hypertension   . Neuromuscular disorder ([place])   . SOB (shortness of breath) on exertion     ASSESSMENT  Recent Results: The most recent result is correlated with 25 mg per week:  Lab Results  Component Value Date   INR 2.5 01/04/2020   INR 2.0 12/21/2019   INR 2.3 11/23/2019    Anticoagulation Dosing: Description   INR at goal. Continue to  5mg  Tues,Thurs, and Sat and 2.5mg  all other days, Return  in 4  week      INR today: Therapeutic. INR remains therapeutic following dose increase last INR check. Pt denies any changes in diet, lifestyle, or medications. Denies any bleeding or bruising symptoms.   PLAN Weekly dose was unchanged. Continue current dose of 5 mg every Tues, Thurs and Sat and 2.5 mg all other days. Recheck INR in 4 weeks w/ OV   Patient Instructions  INR at goal. Continue to  5mg  Tues,Thurs, and Sat and 2.5mg  all other days, Return in 4  week  Patient advised to contact clinic or seek medical attention if signs/symptoms of bleeding or thromboembolism occur.  Patient verbalized understanding by repeating back information and was advised to contact me if  further medication-related questions arise.   Follow-up Return in about 27 days (around 01/31/2020).  Alysia Penna, PharmD  15 minutes spent face-to-face with the patient during the encounter. 50% of time spent on education, including signs/sx bleeding and clotting, as well as food and drug interactions with warfarin. 50% of time was spent on fingerprick POC INR sample collection,processing, results determination, and documentation

## 2020-01-04 NOTE — Patient Instructions (Signed)
INR at goal. Continue to  5mg  Tues,Thurs, and Sat and 2.5mg  all other days, Return in 4  week

## 2020-01-10 ENCOUNTER — Other Ambulatory Visit: Payer: Self-pay | Admitting: Cardiology

## 2020-01-21 ENCOUNTER — Ambulatory Visit: Payer: Medicare Other | Admitting: Pharmacist

## 2020-01-21 ENCOUNTER — Other Ambulatory Visit: Payer: Self-pay

## 2020-01-21 DIAGNOSIS — Z5181 Encounter for therapeutic drug level monitoring: Secondary | ICD-10-CM

## 2020-01-21 DIAGNOSIS — Z7901 Long term (current) use of anticoagulants: Secondary | ICD-10-CM

## 2020-01-21 DIAGNOSIS — I4891 Unspecified atrial fibrillation: Secondary | ICD-10-CM

## 2020-01-21 LAB — POCT INR: INR: 3.4 — AB (ref 2.0–3.0)

## 2020-01-21 NOTE — Progress Notes (Signed)
Anticoagulation Management Gregory [name] is a 74 y.o. male who reports to the clinic for monitoring of warfarin treatment.    Indication: atrial fibrillation CHA2DS2 Vasc Score 4 (Age 77-74, HTN hx, DM Hx, Vascular disease hx) , HAS-BLED 1 (age>65)  Duration: indefinite Supervising physician: Adrian Prows  Anticoagulation Clinic Visit History:  Patient does not report signs/symptoms of bleeding or thromboembolism   Other recent changes: No changes in diet, lifestyle. Recent start Cipro on 01/17/20 to 01/24/20. PCP switched valsartan HCTZ to valsartan. PCP referred pt for renal ultrasound.   Anticoagulation Episode Summary    Current INR goal:  2.0-3.0  TTR:  69.6 % (8 mo)  Next INR check:  02/09/2020  INR from last check:  3.4 (01/21/2020)  Weekly max warfarin dose:    Target end date:  Indefinite  INR check location:    Preferred lab:    Send INR reminders to:     Indications   Atrial fibrillation (HCC) [I48.91] Monitoring for long-term anticoagulant use [Z51.81 Z79.01]       Comments:          No Known Allergies  Current Outpatient Medications:  .  amLODipine (NORVASC) 5 MG tablet, TAKE 1 TABLET BY MOUTH  DAILY, Disp: 90 tablet, Rfl: 0 .  atorvastatin (LIPITOR) 20 MG tablet, TAKE 1 TABLET BY MOUTH ONCE DAILY AT 6 PM. (Patient taking differently: Take 20 mg by mouth daily at 6 PM. ), Disp: 90 tablet, Rfl: 1 .  carvedilol (COREG) 25 MG tablet, Take 25 mg by mouth 2 (two) times daily., Disp: , Rfl:  .  ciprofloxacin (CIPRO) 500 MG tablet, Take 500 mg by mouth 2 (two) times daily. for 7 days, Disp: , Rfl:  .  furosemide (LASIX) 20 MG tablet, Take 1 tablet (20 mg total) by mouth every morning., Disp: 90 tablet, Rfl: 1 .  hydrALAZINE (APRESOLINE) 100 MG tablet, Take 100 mg by mouth 2 (two) times daily. , Disp: , Rfl:  .  metFORMIN (GLUCOPHAGE) 500 MG tablet, Take 500 mg by mouth daily., Disp: , Rfl:  .  potassium chloride 20 MEQ/15ML (10%) SOLN, Take 30 mLs (40 mEq total) by mouth  daily. (Patient taking differently: Take 40 mEq by mouth as needed. ), Disp: 940 mL, Rfl: 6 .  spironolactone (ALDACTONE) 25 MG tablet, TAKE 1 TABLET BY MOUTH ONCE DAILY IN THE MORNING, Disp: 90 tablet, Rfl: 1 .  Tamsulosin HCl (FLOMAX) 0.4 MG CAPS, Take 0.4 mg by mouth daily., Disp: , Rfl:  .  valsartan (DIOVAN) 160 MG tablet, Take 160 mg by mouth at bedtime., Disp: , Rfl:  .  warfarin (COUMADIN) 5 MG tablet, Take 1 tablet (5 mg total) by mouth daily. (Patient taking differently: Take 2.5-5 mg by mouth See admin instructions. Take 1 tablet (5mg ) on Tues and Thurs and take 1/2 tablet (2.5mg ) all other days), Disp: 90 tablet, Rfl: 3  Current Facility-Administered Medications:  .  0.9 %  sodium chloride infusion, 500 mL, Intravenous, Continuous, Pyrtle, [name], MD Past Medical History:  Diagnosis Date  . CAD (coronary artery disease)   . COPD (chronic obstructive pulmonary disease) ([place])   . Diabetes mellitus   . Heart attack ([place])   . Hyperlipidemia   . Hypertension   . Neuromuscular disorder ([place])   . SOB (shortness of breath) on exertion     ASSESSMENT  Recent Results: The most recent result is correlated with 25 mg per week:  Lab Results  Component Value Date   INR  3.4 (A) 01/21/2020   INR 2.5 01/04/2020   INR 2.0 12/21/2019    Anticoagulation Dosing: Description   INR above goal. Take 2.5 mg tomorrow and then continue to  5mg  Tues,Thurs, and Sat and 2.5mg  all other days, Return in 2  week      INR today: Supratherapeutic. Likely in the setting of new start Cipro. 7 day course. End date: 8/2. Denies any s/sx of bleeding or bruising. Pt was previously stable on current dose. Pt reports that he already took his maintainance dose today. Will dose adjust tomorrow and have pt continue previous maintainance dose. Will have close f/u to ensure INR returns to being within therapeutic range.   PLAN Weekly dose was unchanged. Take 2.5 mg today and then continue current dose of 5 mg  every Tues, Thurs and Sat and 2.5 mg all other days. Recheck INR in 2 weeks w/ OV   Patient Instructions  INR above goal. Take 2.5 mg tomorrow and then continue to  5mg  Tues,Thurs, and Sat and 2.5mg  all other days, Return in 2  week  Patient advised to contact clinic or seek medical attention if signs/symptoms of bleeding or thromboembolism occur.  Patient verbalized understanding by repeating back information and was advised to contact me if further medication-related questions arise.   Follow-up Return in about 10 days (around 01/31/2020).  Alysia Penna, PharmD  15 minutes spent face-to-face with the patient during the encounter. 50% of time spent on education, including signs/sx bleeding and clotting, as well as food and drug interactions with warfarin. 50% of time was spent on fingerprick POC INR sample collection,processing, results determination, and documentation

## 2020-01-21 NOTE — Patient Instructions (Signed)
INR above goal. Take 2.5 mg tomorrow and then continue to  5mg  Tues,Thurs, and Sat and 2.5mg  all other days, Return in 2  week

## 2020-01-31 ENCOUNTER — Encounter: Payer: Self-pay | Admitting: Cardiology

## 2020-01-31 ENCOUNTER — Ambulatory Visit: Payer: Medicare Other | Admitting: Cardiology

## 2020-01-31 ENCOUNTER — Other Ambulatory Visit: Payer: Self-pay

## 2020-01-31 VITALS — BP 112/65 | HR 57 | Resp 16 | Ht 71.0 in | Wt 251.4 lb

## 2020-01-31 DIAGNOSIS — N184 Chronic kidney disease, stage 4 (severe): Secondary | ICD-10-CM

## 2020-01-31 DIAGNOSIS — I10 Essential (primary) hypertension: Secondary | ICD-10-CM

## 2020-01-31 DIAGNOSIS — Z7901 Long term (current) use of anticoagulants: Secondary | ICD-10-CM

## 2020-01-31 DIAGNOSIS — I4821 Permanent atrial fibrillation: Secondary | ICD-10-CM

## 2020-01-31 DIAGNOSIS — I2510 Atherosclerotic heart disease of native coronary artery without angina pectoris: Secondary | ICD-10-CM

## 2020-01-31 DIAGNOSIS — I129 Hypertensive chronic kidney disease with stage 1 through stage 4 chronic kidney disease, or unspecified chronic kidney disease: Secondary | ICD-10-CM

## 2020-01-31 DIAGNOSIS — Z5181 Encounter for therapeutic drug level monitoring: Secondary | ICD-10-CM

## 2020-01-31 LAB — POCT INR: INR: 2.3 (ref 2.0–3.0)

## 2020-01-31 NOTE — Progress Notes (Signed)
Primary Physician/Referring:  [name], MD  Patient ID: Gregory [name], male    DOB: October 01, 1945, 74 y.o.   MRN: [id number]  Chief Complaint  Patient presents with  . Atrial Fibrillation  . Coronary Artery Disease  . Hypertension  . Chronic Renal Failure  . Follow-up    3 month   HPI:    Gregory [name]  is a 74 y.o. 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     He is here on a 75-monthoffice visit of hypertension, heart failure and atrial fibrillation.  Presently doing well no new complaints, no further leg edema, no PND or orthopnea.  Denies any chest pain or palpitations.  Dyspnea has remained stable.  Past Medical History:  Diagnosis Date  . CAD (coronary artery disease)   . COPD ([place] disease) [place]   . Diabetes mellitus   . Heart attack ([place]   . Hyperlipidemia   . Hypertension   . Neuromuscular disorder ([place]   . SOB (shortness of breath) on exertion    Past Surgical History:  Procedure Laterality Date  . BACK SURGERY    . CARDIAC CATHETERIZATION    . CORONARY STENT PLACEMENT    . KNEE ARTHROPLASTY    . LEFT HEART CATH AND CORONARY ANGIOGRAPHY N/A 03/19/2019   Procedure: LEFT HEART CATH AND CORONARY ANGIOGRAPHY;  Surgeon: PNigel Mormon MD;  [facility];  Service: Cardiovascular;  Laterality: N/A;  . LEFT HEART CATHETERIZATION WITH CORONARY ANGIOGRAM N/A 03/03/2012   Procedure: LEFT HEART CATHETERIZATION WITH CORONARY ANGIOGRAM;  Surgeon: JLaverda Page MD;  [facility];  Service: Cardiovascular;  Laterality: N/A;  . PERCUTANEOUS CORONARY STENT INTERVENTION (PCI-S)  03/03/2012   Procedure:  PERCUTANEOUS CORONARY STENT INTERVENTION (PCI-S);  Surgeon: JLaverda Page MD;  [facility];  Service: Cardiovascular;;   Social History   Tobacco Use  . Smoking status: Former Smoker    Packs/day: 1.00    Years: 30.00    Pack years: 30.00    Types: Cigarettes    Quit date: 07/30/2003    Years since quitting: 16.5  . Smokeless tobacco: Never Used  Substance Use Topics  . Alcohol use: Yes    Alcohol/week: 0.0 standard drinks    Comment: wine rarely    Marital Status: Married  ROS  Review of Systems  HENT: Negative for nosebleeds.   Cardiovascular: Positive for dyspnea on exertion (chronic and stable). Negative for chest pain and leg swelling.  Musculoskeletal: Positive for back pain (chronic and 3 back operations).  Gastrointestinal: Negative for melena.  Neurological: Positive for paresthesias (fingers).   Objective   Vitals with BMI 01/31/2020 11/04/2019 09/13/2019  Height 5' 11" 5' 11" 5' 11"  Weight 251 lbs 6 oz 253 lbs 256 lbs  BMI 35.08 [phone] Systolic [id number] Diastolic 65 70 61  Pulse 57 56 45    Blood pressure 112/65, pulse (!) 57, resp. rate 16, height 5' 11" (1.803 m), weight 251 lb 6.4 oz (114 kg), SpO2 97 %. Body mass index is 35.06 kg/m.   Physical Exam Constitutional:      Comments: He is well-built and moderately obese in no acute distress.  Cardiovascular:     Rate and Rhythm: Rhythm irregularly irregular.     Pulses: Normal pulses and intact distal pulses.     Heart sounds: No murmur heard.  No gallop. No S3 or S4 sounds.      Comments: S1 is variable, S2 is normal. No JVD.  1 plus ankle edema, pitting Pulmonary:     Effort: Pulmonary effort is normal.     Breath sounds: Normal breath sounds.  Abdominal:     General: Bowel sounds are normal.     Palpations: Abdomen is soft.    Laboratory examination:   Recent Labs    07/27/19 1057 09/09/19 1108 09/13/19 1310  NA 142 139 141  K 4.2 4.2 3.7  CL 103 102 100  CO2 _0 GLUCOSE 96 131* 92  BUN 15 14 29*  CREATININE 1.90* 1.73* 1.97*  CALCIUM 9.4 9.4 9.3  GFRNONAA 34* 38* 33*  GFRAA 39* 44* 38*   CMP Latest Ref Rng & Units 09/13/2019 09/09/2019 07/27/2019  Glucose 65 - 99 mg/dL 92 131(H) 96  BUN 8 - 27 mg/dL 29(H) 14 15  Creatinine 0.76 - 1.27 mg/dL 1.97(H) 1.73(H) 1.90(H)  Sodium 134 - 144 mmol/L 141 139 142  Potassium 3.5 - 5.2 mmol/L 3.7 4.2 4.2  Chloride 96 - 106 mmol/L 100 102 103  CO2 20 - 29 mmol/L _1 Calcium 8.6 - 10.2 mg/dL 9.3 9.4 9.4  Total Protein 6.5 - 8.1 g/dL - 7.6 7.3  Total Bilirubin 0.3 - 1.2 mg/dL - 2.1(H) 0.7  Alkaline Phos 38 - 126 U/L - 96 117  AST 15 - 41 U/L - 17 9  ALT 0 - 44 U/L - 19 8   CBC Latest Ref Rng & Units 09/09/2019 07/27/2019 03/19/2019  WBC 4.0 - 10.5 K/uL 7.4 6.5 7.9  Hemoglobin 13.0 - 17.0 g/dL 12.8(L) 13.2 13.5  Hematocrit 39 - 52 % 40.9 40.9 39.9  Platelets 150 - 400 K/uL 166 194 161   Lipid Panel Recent Labs    03/19/19 0321  CHOL 148  TRIG 132  LDLCALC 90  VLDL 26  HDL 32*  CHOLHDL 4.6    HEMOGLOBIN A1C Lab Results  Component Value Date   HGBA1C 6.0 (H) 03/18/2019   MPG 125.5 03/18/2019   BNP (last 3 results) Recent Labs    09/09/19 1108 09/13/19 1310  BNP 787.7* 452.6*    External Labs:   Lab Results  Component Value Date   INR 2.3 01/31/2020   INR 3.4 (A) 01/21/2020   INR 2.5 01/04/2020   External labs:  Lab 01/17/2020:  Serum glucose 109 mg, BUN 55, creatinine 2.6, EGFR 29.3 mL, sodium 140, potassium 4.8.  PSA 0.777 ng/ 08/23/2019  Medications and allergies  No Known Allergies   Current Outpatient Medications  Medication Instructions  . amLODipine (NORVASC) 5 MG tablet TAKE 1 TABLET BY MOUTH  DAILY  . atorvastatin (LIPITOR) 20 MG tablet TAKE 1 TABLET BY MOUTH ONCE DAILY AT 6 PM.  . carvedilol (COREG) 25 mg, Oral, 2 times daily  . furosemide (LASIX) 20 mg, Oral, BH-each morning  . hydrALAZINE (APRESOLINE) 100 mg, Oral, 2 times daily  . metFORMIN  (GLUCOPHAGE) 500 mg, Daily  . spironolactone (ALDACTONE) 25 MG tablet TAKE 1 TABLET BY MOUTH ONCE DAILY IN THE MORNING  . tamsulosin (FLOMAX) 0.4 mg, Oral, Daily  . valsartan (DIOVAN) 160 mg, Oral, Daily at bedtime  . warfarin (COUMADIN) 5 mg, Oral, Daily  Radiology:   No results found.  Cardiac Studies:   Coronary Angiography 03/19/2019:  No change from 03/06/2012: LM: Normal LAD: Mid LAD focal 40% stenosis          Ostial diag-2 80% stenosis with TIMI III flow. Unchanged compared to 2013 angiography. Patent mid Diag 2 stent without ISR LCx: Patent mid LCX overlapping stents with 10-20% late lumen loss RCA: CTO Mid 100% occlusion with grade 3 left-to-right collaterals.   Echocardiogram 09/22/2019:  Left ventricle cavity is normal in size. Mild concentric hypertrophy of the left ventricle. Normal global wall motion. Mildly depressed LV systolic function with visual EF 40-45%. Could not evaluate diastolic function, patient probably in Afib. Left atrial cavity is severely dilated. LA vol index 67 ml/m2. Trileaflet aortic valve. Trace aortic stenosis. Trace aortic regurgitation. Mild aortic valve leaflet calcification.  Moderate (Grade II) mitral regurgitation.  Moderate tricuspid regurgitation. Moderate pulmonary hypertension.  Estimated pulmonary artery systolic pressure is 50 mmHg. Estimated RA pressure 10-15 mmHg.  Compared to previous study on 03/19/2019, there is significant increase in PASP from 30 mmHg.   EKG  EKG 11/04/2019: Atrial fibrillation with controlled ventricular response at the rate of 70 bpm, normal axis, poor R wave progression, cannot exclude anteroseptal infarct old.  Nonspecific T abnormality.  PVCs (3).  Compared to prior EKG on 09/13/2019, ectopic atrial rhythm replaced by atrial fibrillation.   03/29/2019: Atrial fibrillation with  rapid ventricular response at rate of 115 bpm, normal axis.  Anteroseptal infarct old.  Nonspecific T normality.  Single  PVC.  Assessment      ICD-10-CM   1. Permanent atrial fibrillation (HCC)  I48.21 POCT INR  2. Monitoring for long-term anticoagulant use  Z51.81    Z79.01   3. Atherosclerosis of native coronary artery of native heart without angina pectoris  I25.10   4. Essential hypertension  I10   5. CKD stage 4 secondary to hypertension (HCC)  I12.9    N18.4    CHA2DS2-VASc Score is 5.  Yearly risk of stroke: 7.2% (A, HTN, CAD & CHF).  Score of 1=0.6; 2=2.2; 3=3.2; 4=4.8; 5=7.2; 6=9.8; 7=>9.8) -(CHF; HTN; vasc disease DM,  Male = 1; Age <65 =0; 65-74 = 1,  >75 =2; stroke/embolism= 2).     No orders of the defined types were placed in this encounter.  Medications Discontinued During This Encounter  Medication Reason  . potassium chloride 20 MEQ/15ML (10%) SOLN Discontinued by provider  . ciprofloxacin (CIPRO) 500 MG tablet Completed Course   Recommendations:   Gregory [name]  is a 74 y.o.  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    He was recently treated for UTI, his renal function had deteriorated to stage IV chronic kidney disease from stage IIIa.  Suspect this probably will reverse, Diovan HCT was changed by his PCP to Diovan plain.  He is maintaining sinus rhythm.  Otherwise his labs are reviewed, no acute decompensated heart failure, leg edema is essentially resolved.  If he continues to lose weight, discontinuation of spironolactone will certainly improve his renal function.  He is on appropriate medical therapy, lipids are well controlled, no changes were done today.  Patient is in Remote Patient Monitoring and Principal Care Management as patient is  high risk for  hospitalization and complications from underlying medical conditions.   His INR was 2.3 today, continue present dose of Coumadin.   Adrian Prows, MD, [facility] [place] 01/31/2020, 12:11 PM Office: [phone]

## 2020-02-29 ENCOUNTER — Ambulatory Visit: Payer: Medicare Other | Admitting: Pharmacist

## 2020-02-29 ENCOUNTER — Other Ambulatory Visit: Payer: Self-pay

## 2020-02-29 ENCOUNTER — Other Ambulatory Visit: Payer: Self-pay | Admitting: Pharmacist

## 2020-02-29 DIAGNOSIS — I4821 Permanent atrial fibrillation: Secondary | ICD-10-CM

## 2020-02-29 DIAGNOSIS — Z5181 Encounter for therapeutic drug level monitoring: Secondary | ICD-10-CM

## 2020-02-29 DIAGNOSIS — Z7901 Long term (current) use of anticoagulants: Secondary | ICD-10-CM

## 2020-02-29 DIAGNOSIS — I129 Hypertensive chronic kidney disease with stage 1 through stage 4 chronic kidney disease, or unspecified chronic kidney disease: Secondary | ICD-10-CM

## 2020-02-29 DIAGNOSIS — N184 Chronic kidney disease, stage 4 (severe): Secondary | ICD-10-CM

## 2020-02-29 DIAGNOSIS — I4891 Unspecified atrial fibrillation: Secondary | ICD-10-CM

## 2020-02-29 LAB — POCT INR
INR: 2.5 (ref 2.0–3.0)
INR: 2.5 (ref 2.0–3.0)

## 2020-02-29 NOTE — Progress Notes (Signed)
Anticoagulation Management Gregory [name] is a 74 y.o. male who reports to the clinic for monitoring of warfarin treatment.    Indication: atrial fibrillation CHA2DS2 Vasc Score 4 (Age 59-74, HTN hx, DM Hx, Vascular disease hx) , HAS-BLED 1 (age>65)  Duration: indefinite Supervising physician: Adrian Prows  Anticoagulation Clinic Visit History:  Patient does not report signs/symptoms of bleeding or thromboembolism   Other recent changes: No changes in diet, lifestyle. Recent complains of gout flare up. Pt started taking cherry juice daily that has provided mild improvement. Pt hasnt previously been on any antigout agent. Currently managing pain with tylenol. Denies any recent NSAIDs use. Pt is interested in applying for patient assistance for DOACs. Pt meets eligibility criteria for patient assistance. Will discuss with Dr. Einar Gip and start application at next [place]. Recent SCr of 1.97 and eGFR of 38.   Anticoagulation Episode Summary    Current INR goal:  2.0-3.0  TTR:  72.6 % (9.3 mo)  Next INR check:  03/28/2020  INR from last check:  2.5 (02/29/2020)  Weekly max warfarin dose:    Target end date:  Indefinite  INR check location:    Preferred lab:    Send INR reminders to:     Indications   Atrial fibrillation (HCC) [I48.91] Monitoring for long-term anticoagulant use [Z51.81 Z79.01]       Comments:          No Known Allergies  Current Outpatient Medications:  .  amLODipine (NORVASC) 5 MG tablet, TAKE 1 TABLET BY MOUTH  DAILY, Disp: 90 tablet, Rfl: 0 .  atorvastatin (LIPITOR) 20 MG tablet, TAKE 1 TABLET BY MOUTH ONCE DAILY AT 6 PM. (Patient taking differently: Take 20 mg by mouth daily at 6 PM. ), Disp: 90 tablet, Rfl: 1 .  carvedilol (COREG) 25 MG tablet, Take 25 mg by mouth 2 (two) times daily., Disp: , Rfl:  .  furosemide (LASIX) 20 MG tablet, Take 1 tablet (20 mg total) by mouth every morning., Disp: 90 tablet, Rfl: 1 .  hydrALAZINE (APRESOLINE) 100 MG tablet, Take 100 mg by  mouth 2 (two) times daily. , Disp: , Rfl:  .  metFORMIN (GLUCOPHAGE) 500 MG tablet, Take 500 mg by mouth daily., Disp: , Rfl:  .  spironolactone (ALDACTONE) 25 MG tablet, TAKE 1 TABLET BY MOUTH ONCE DAILY IN THE MORNING, Disp: 90 tablet, Rfl: 1 .  Tamsulosin HCl (FLOMAX) 0.4 MG CAPS, Take 0.4 mg by mouth daily., Disp: , Rfl:  .  valsartan (DIOVAN) 160 MG tablet, Take 160 mg by mouth at bedtime., Disp: , Rfl:  .  warfarin (COUMADIN) 5 MG tablet, Take 1 tablet (5 mg total) by mouth daily. (Patient taking differently: Take 2.5-5 mg by mouth See admin instructions. 5 mg (5 mg x 1) every Tue, Thu, Sat; 2.5 mg (5 mg x 0.5) all other days. Refer to most recent anticoagulation note for most accurate dosing instructions), Disp: 90 tablet, Rfl: 3  Current Facility-Administered Medications:  .  0.9 %  sodium chloride infusion, 500 mL, Intravenous, Continuous, Pyrtle, [name], MD Past Medical History:  Diagnosis Date  . CAD (coronary artery disease)   . COPD (chronic obstructive pulmonary disease) ([place])   . Diabetes mellitus   . Heart attack ([place])   . Hyperlipidemia   . Hypertension   . Neuromuscular disorder ([place])   . SOB (shortness of breath) on exertion     ASSESSMENT  Recent Results: The most recent result is correlated with 25 mg per week:  Lab Results  Component Value Date   INR 2.5 02/29/2020   INR 2.5 02/29/2020   INR 2.3 01/31/2020    Anticoagulation Dosing: Description   INR above goal. No changes, continue medication as directed. Return in 1 month, per [place].      INR today: Supratherapeutic. Likely in the setting of new start Cipro. 7 day course. End date: 8/2. Denies any s/sx of bleeding or bruising. Pt was previously stable on current dose. Pt reports that he already took his maintainance dose today. Will dose adjust tomorrow and have pt continue previous maintainance dose. Will have close f/u to ensure INR returns to being within therapeutic range.   PLAN Weekly dose was  unchanged. Take 2.5 mg today and then continue current dose of 5 mg every Tues, Thurs and Sat and 2.5 mg all other days. Recheck INR in 2 weeks w/ OV   Patient Instructions  INR at goal. Continue taking 5 mg every Tues, Thurs, Sat and 2.5 mg all other days. Recheck INR in 4 weeks   Patient advised to contact clinic or seek medical attention if signs/symptoms of bleeding or thromboembolism occur.  Patient verbalized understanding by repeating back information and was advised to contact me if further medication-related questions arise.   Follow-up Return in about 4 weeks (around 03/28/2020).  Alysia Penna, PharmD  15 minutes spent face-to-face with the patient during the encounter. 50% of time spent on education, including signs/sx bleeding and clotting, as well as food and drug interactions with warfarin. 50% of time was spent on fingerprick POC INR sample collection,processing, results determination, and documentation

## 2020-02-29 NOTE — Patient Instructions (Addendum)
INR at goal. Continue taking 5 mg every Tues, Thurs, Sat and 2.5 mg all other days. Recheck INR in 4 weeks

## 2020-03-01 ENCOUNTER — Other Ambulatory Visit: Payer: Self-pay | Admitting: Cardiology

## 2020-03-17 ENCOUNTER — Other Ambulatory Visit: Payer: Self-pay | Admitting: Cardiology

## 2020-03-21 NOTE — Telephone Encounter (Signed)
His PCP switched valsartan-HCTZ to valsartan ~01/21/20

## 2020-03-22 ENCOUNTER — Ambulatory Visit: Payer: Medicare Other | Admitting: Pharmacist

## 2020-03-22 ENCOUNTER — Other Ambulatory Visit: Payer: Self-pay

## 2020-03-22 ENCOUNTER — Other Ambulatory Visit: Payer: Self-pay | Admitting: Cardiology

## 2020-03-22 DIAGNOSIS — Z7901 Long term (current) use of anticoagulants: Secondary | ICD-10-CM

## 2020-03-22 DIAGNOSIS — I4821 Permanent atrial fibrillation: Secondary | ICD-10-CM

## 2020-03-22 DIAGNOSIS — Z5181 Encounter for therapeutic drug level monitoring: Secondary | ICD-10-CM

## 2020-03-22 LAB — POCT INR: INR: 3.2 — AB (ref 2.0–3.0)

## 2020-03-22 NOTE — Progress Notes (Signed)
Anticoagulation Management Gregory [name] is a 74 y.o. male who reports to the clinic for monitoring of warfarin treatment.    Indication: atrial fibrillation CHA2DS2 Vasc Score 4 (Age 76-74, HTN hx, DM Hx, Vascular disease hx) , HAS-BLED 1 (age>65)  Duration: indefinite Supervising physician: Adrian Prows  Anticoagulation Clinic Visit History:  Patient does not report signs/symptoms of bleeding or thromboembolism   Other recent changes: No changes in diet, lifestyle. Improvement in gout symptoms since last visit. Per Dr. Einar Gip, okay to transition to eliquis. Pt interested int pursuing Eliquis. Pt concerned of potential cost, started [employer] PAP for Eliquis. Pt aware to continue current warfarin therapy for now till Eliquis PAP is approved.   Recent Neph and PCP appt. Amlodipine d/c and valsartan dose decreased to 80 mg daily due to concerns of hypotension.   Anticoagulation Episode Summary    Current INR goal:  2.0-3.0  TTR:  72.5 % (10 mo)  Next INR check:  04/05/2020  INR from last check:  3.2 (03/22/2020)  Weekly max warfarin dose:    Target end date:  Indefinite  INR check location:    Preferred lab:    Send INR reminders to:     Indications   Atrial fibrillation (HCC) [I48.91] Monitoring for long-term anticoagulant use [Z51.81 Z79.01]       Comments:          No Known Allergies  Current Outpatient Medications:  .  atorvastatin (LIPITOR) 20 MG tablet, TAKE 1 TABLET BY MOUTH ONCE DAILY AT  6PM, Disp: 90 tablet, Rfl: 1 .  carvedilol (COREG) 25 MG tablet, Take 25 mg by mouth 2 (two) times daily., Disp: , Rfl:  .  furosemide (LASIX) 20 MG tablet, TAKE 1 TABLET BY MOUTH ONCE DAILY IN THE MORNING, Disp: 90 tablet, Rfl: 2 .  hydrALAZINE (APRESOLINE) 100 MG tablet, Take 100 mg by mouth 2 (two) times daily. , Disp: , Rfl:  .  metFORMIN (GLUCOPHAGE) 500 MG tablet, Take 500 mg by mouth daily., Disp: , Rfl:  .  spironolactone (ALDACTONE) 25 MG tablet, TAKE 1  TABLET BY MOUTH ONCE DAILY IN THE MORNING, Disp: 90 tablet, Rfl: 1 .  Tamsulosin HCl (FLOMAX) 0.4 MG CAPS, Take 0.4 mg by mouth daily., Disp: , Rfl:  .  valsartan (DIOVAN) 80 MG tablet, Take 80 mg by mouth daily., Disp: , Rfl:  .  warfarin (COUMADIN) 5 MG tablet, Take 1 tablet (5 mg total) by mouth daily. (Patient taking differently: Take 2.5-5 mg by mouth See admin instructions. 5 mg (5 mg x 1) every Tue, Thu, Sat; 2.5 mg (5 mg x 0.5) all other days. Refer to most recent anticoagulation note for most accurate dosing instructions), Disp: 90 tablet, Rfl: 3  Current Facility-Administered Medications:  .  0.9 %  sodium chloride infusion, 500 mL, Intravenous, Continuous, Pyrtle, [name], MD Past Medical History:  Diagnosis Date  . CAD (coronary artery disease)   . COPD (chronic obstructive pulmonary disease) ([place])   . Diabetes mellitus   . Heart attack ([place])   . Hyperlipidemia   . Hypertension   . Neuromuscular disorder ([place])   . SOB (shortness of breath) on exertion     ASSESSMENT  Recent Results: The most recent result is correlated with 25 mg per week:  Lab Results  Component Value Date   INR 3.2 (A) 03/22/2020   INR 2.5 02/29/2020   INR 2.5 02/29/2020    Anticoagulation Dosing: Description   INR above goal. Continue  current weekly dose of 5 mg Tues, Thurs, Sat and 2.5 mg all other days. Return in 2 weeks.       INR today: Supratherapeutic. Unclear reason for INR elevation. Pt was previously stable on current weekly dose. Denies any s/sx of bleeding or bruising.Will continue current dose and increase Vit K intake to 1-2 servings/week. Will continue close monitoring and follow up.   PLAN Weekly dose was unchanged. Take 2.5 mg today and then continue current dose of 5 mg every Tues, Thurs and Sat and 2.5 mg all other days. Recheck INR in 2 weeks  Patient Instructions  INR above goal. Continue current weekly dose of 5 mg Tues, Thurs, Sat and 2.5 mg all other days. Return in 2  weeks.   Patient advised to contact clinic or seek medical attention if signs/symptoms of bleeding or thromboembolism occur.  Patient verbalized understanding by repeating back information and was advised to contact me if further medication-related questions arise.   Follow-up Return in about 3 weeks (around 04/12/2020).  Alysia Penna, PharmD  15 minutes spent face-to-face with the patient during the encounter. 50% of time spent on education, including signs/sx bleeding and clotting, as well as food and drug interactions with warfarin. 50% of time was spent on fingerprick POC INR sample collection,processing, results determination, and documentation

## 2020-03-22 NOTE — Patient Instructions (Signed)
INR above goal. Continue current weekly dose of 5 mg Tues, Thurs, Sat and 2.5 mg all other days. Return in 2 weeks.

## 2020-04-02 ENCOUNTER — Other Ambulatory Visit: Payer: Self-pay | Admitting: Cardiology

## 2020-04-04 ENCOUNTER — Other Ambulatory Visit: Payer: Self-pay | Admitting: Cardiology

## 2020-04-05 ENCOUNTER — Other Ambulatory Visit: Payer: Self-pay

## 2020-04-05 ENCOUNTER — Ambulatory Visit: Payer: Medicare Other | Admitting: Pharmacist

## 2020-04-05 DIAGNOSIS — Z7901 Long term (current) use of anticoagulants: Secondary | ICD-10-CM

## 2020-04-05 DIAGNOSIS — Z5181 Encounter for therapeutic drug level monitoring: Secondary | ICD-10-CM

## 2020-04-05 DIAGNOSIS — I480 Paroxysmal atrial fibrillation: Secondary | ICD-10-CM

## 2020-04-05 LAB — POCT INR: INR: 2.2 (ref 2.0–3.0)

## 2020-04-05 NOTE — Progress Notes (Signed)
Anticoagulation Management Gregory [name] is a 73 y.o. male who reports to the clinic for monitoring of warfarin treatment.    Indication: atrial fibrillation CHA2DS2 Vasc Score 4 (Age 67-74, HTN hx, DM Hx, Vascular disease hx) , HAS-BLED 1 (age>65)  Duration: indefinite Supervising physician: Adrian Prows  Anticoagulation Clinic Visit History:  Patient does not report signs/symptoms of bleeding or thromboembolism   Other recent changes: No changes in diet, lifestyle. Eliquis PAP denies due to insufficient OOP spend. Reviewed with pt. Pt wishing to continue with warfarin for now.    Recent Neph and PCP appt. Amlodipine d/c and valsartan dose decreased to 80 mg daily due to concerns of hypotension. Pt also started on PRN colchicine for gout flare management.    Anticoagulation Episode Summary    Current INR goal:  2.0-3.0  TTR:  72.8 % (10.5 mo)  Next INR check:  05/03/2020  INR from last check:  2.2 (04/05/2020)  Weekly max warfarin dose:    Target end date:  Indefinite  INR check location:    Preferred lab:    Send INR reminders to:     Indications   Atrial fibrillation (HCC) [I48.91] Monitoring for long-term anticoagulant use [Z51.81 Z79.01]       Comments:          No Known Allergies  Current Outpatient Medications:  .  colchicine 0.6 MG tablet, Take 0.6 mg by mouth daily as needed (gout flare up). , Disp: , Rfl:  .  atorvastatin (LIPITOR) 20 MG tablet, TAKE 1 TABLET BY MOUTH ONCE DAILY AT  6PM, Disp: 90 tablet, Rfl: 1 .  carvedilol (COREG) 25 MG tablet, Take 25 mg by mouth 2 (two) times daily., Disp: , Rfl:  .  furosemide (LASIX) 20 MG tablet, TAKE 1 TABLET BY MOUTH ONCE DAILY IN THE MORNING, Disp: 90 tablet, Rfl: 2 .  hydrALAZINE (APRESOLINE) 100 MG tablet, Take 100 mg by mouth 2 (two) times daily. , Disp: , Rfl:  .  metFORMIN (GLUCOPHAGE) 500 MG tablet, Take 500 mg by mouth daily., Disp: , Rfl:  .  spironolactone (ALDACTONE) 25 MG tablet, TAKE 1 TABLET BY MOUTH ONCE  DAILY IN THE MORNING, Disp: 90 tablet, Rfl: 1 .  Tamsulosin HCl (FLOMAX) 0.4 MG CAPS, Take 0.4 mg by mouth daily., Disp: , Rfl:  .  valsartan (DIOVAN) 80 MG tablet, Take 80 mg by mouth daily., Disp: , Rfl:  .  warfarin (COUMADIN) 5 MG tablet, Take 1 tablet (5 mg total) by mouth daily. (Patient taking differently: Take 2.5-5 mg by mouth See admin instructions. 5 mg (5 mg x 1) every Tue, Thu, Sat; 2.5 mg (5 mg x 0.5) all other days. Refer to most recent anticoagulation note for most accurate dosing instructions), Disp: 90 tablet, Rfl: 3  Current Facility-Administered Medications:  .  0.9 %  sodium chloride infusion, 500 mL, Intravenous, Continuous, Pyrtle, [name], MD Past Medical History:  Diagnosis Date  . CAD (coronary artery disease)   . COPD (chronic obstructive pulmonary disease) ([place])   . Diabetes mellitus   . Heart attack ([place])   . Hyperlipidemia   . Hypertension   . Neuromuscular disorder ([place])   . SOB (shortness of breath) on exertion     ASSESSMENT  Recent Results: The most recent result is correlated with 25 mg per week:  Lab Results  Component Value Date   INR 2.2 04/05/2020   INR 3.2 (A) 03/22/2020   INR 2.5 02/29/2020    Anticoagulation Dosing: Description  INR at goal. Continue current weekly dose of 5 mg Tues, Thurs, Sat and 2.5 mg all other days. Return in 4 weeks.       INR today: Therapeutic. Returns to being therapeutic after dose adjustment last visit. Pt was previously stable on current weekly dose. Denies any s/sx of bleeding or bruising.Will continue current dose and increase Vit K intake to 1-2 servings/week. Will continue close monitoring and follow up.   PLAN Weekly dose was unchanged by 0% to 25 mg/week. Continue current dose of 5 mg every Tues, Thurs and Sat and 2.5 mg all other days. Recheck INR in 4 weeks  Patient Instructions  INR at goal. Continue current weekly dose of 5 mg Tues, Thurs, Sat and 2.5 mg all other days. Return in 4 weeks.    Patient advised to contact clinic or seek medical attention if signs/symptoms of bleeding or thromboembolism occur.  Patient verbalized understanding by repeating back information and was advised to contact me if further medication-related questions arise.   Follow-up Return in about 4 weeks (around 05/03/2020).  Alysia Penna, PharmD  15 minutes spent face-to-face with the patient during the encounter. 50% of time spent on education, including signs/sx bleeding and clotting, as well as food and drug interactions with warfarin. 50% of time was spent on fingerprick POC INR sample collection,processing, results determination, and documentation

## 2020-04-05 NOTE — Patient Instructions (Signed)
INR at goal. Continue current weekly dose of 5 mg Tues, Thurs, Sat and 2.5 mg all other days. Return in 4 weeks.

## 2020-05-03 ENCOUNTER — Other Ambulatory Visit: Payer: Self-pay

## 2020-05-03 ENCOUNTER — Ambulatory Visit: Payer: Medicare Other | Admitting: Pharmacist

## 2020-05-03 DIAGNOSIS — Z7901 Long term (current) use of anticoagulants: Secondary | ICD-10-CM

## 2020-05-03 DIAGNOSIS — Z5181 Encounter for therapeutic drug level monitoring: Secondary | ICD-10-CM

## 2020-05-03 DIAGNOSIS — I4819 Other persistent atrial fibrillation: Secondary | ICD-10-CM

## 2020-05-03 LAB — POCT INR: INR: 3 (ref 2.0–3.0)

## 2020-05-03 NOTE — Patient Instructions (Signed)
INR at goal. Continue current weekly dose of 5 mg Tues, Thurs, Sat and 2.5 mg all other days. Return in 4 weeks.

## 2020-05-03 NOTE — Progress Notes (Signed)
Anticoagulation Management Gregory [name] is a 74 y.o. male who reports to the clinic for monitoring of warfarin treatment.    Indication: atrial fibrillation CHA2DS2 Vasc Score 4 (Age 42-74, HTN hx, DM Hx, Vascular disease hx) , HAS-BLED 1 (age>65)  Duration: indefinite Supervising physician: Adrian Prows  Anticoagulation Clinic Visit History:  Patient does not report signs/symptoms of bleeding or thromboembolism   Other recent changes: No changes in diet, lifestyle. Complains of mild lightheadedness and fatigue recently. Recommended pt to monitor HR and BP and to send in his readings via MyChart to Dr. Einar Gip. Has a f/u OV w/ PCP to better manage his gout and arthritic pain. Has a upcoming f/u with nephrology as well.   Anticoagulation Episode Summary    Current INR goal:  2.0-3.0  TTR:  75.1 % (11.4 mo)  Next INR check:  05/31/2020  INR from last check:  3.0 (05/03/2020)  Weekly max warfarin dose:    Target end date:  Indefinite  INR check location:    Preferred lab:    Send INR reminders to:     Indications   Atrial fibrillation (HCC) [I48.91] Monitoring for long-term anticoagulant use [Z51.81 Z79.01]       Comments:          No Known Allergies  Current Outpatient Medications:  .  amLODipine (NORVASC) 5 MG tablet, TAKE 1 TABLET BY MOUTH  DAILY, Disp: 90 tablet, Rfl: 3 .  atorvastatin (LIPITOR) 20 MG tablet, TAKE 1 TABLET BY MOUTH ONCE DAILY AT  6PM, Disp: 90 tablet, Rfl: 1 .  carvedilol (COREG) 25 MG tablet, Take 25 mg by mouth 2 (two) times daily., Disp: , Rfl:  .  colchicine 0.6 MG tablet, Take 0.6 mg by mouth daily as needed (gout flare up). , Disp: , Rfl:  .  furosemide (LASIX) 20 MG tablet, TAKE 1 TABLET BY MOUTH ONCE DAILY IN THE MORNING, Disp: 90 tablet, Rfl: 2 .  hydrALAZINE (APRESOLINE) 100 MG tablet, Take 100 mg by mouth 2 (two) times daily. , Disp: , Rfl:  .  metFORMIN (GLUCOPHAGE) 500 MG tablet, Take 500 mg by mouth daily., Disp: , Rfl:  .  spironolactone  (ALDACTONE) 25 MG tablet, TAKE 1 TABLET BY MOUTH ONCE DAILY IN THE MORNING, Disp: 90 tablet, Rfl: 1 .  Tamsulosin HCl (FLOMAX) 0.4 MG CAPS, Take 0.4 mg by mouth daily., Disp: , Rfl:  .  valsartan (DIOVAN) 80 MG tablet, Take 80 mg by mouth daily., Disp: , Rfl:  .  warfarin (COUMADIN) 5 MG tablet, Take 1 tablet (5 mg total) by mouth daily. (Patient taking differently: Take 2.5-5 mg by mouth See admin instructions. 5 mg (5 mg x 1) every Tue, Thu, Sat; 2.5 mg (5 mg x 0.5) all other days. Refer to most recent anticoagulation note for most accurate dosing instructions), Disp: 90 tablet, Rfl: 3  Current Facility-Administered Medications:  .  0.9 %  sodium chloride infusion, 500 mL, Intravenous, Continuous, Pyrtle, [name], MD Past Medical History:  Diagnosis Date  . CAD (coronary artery disease)   . COPD (chronic obstructive pulmonary disease) ([place])   . Diabetes mellitus   . Heart attack ([place])   . Hyperlipidemia   . Hypertension   . Neuromuscular disorder ([place])   . SOB (shortness of breath) on exertion     ASSESSMENT  Recent Results: The most recent result is correlated with 25 mg per week:  Lab Results  Component Value Date   INR 3.0 05/03/2020   INR 2.2  04/05/2020   INR 3.2 (A) 03/22/2020    Anticoagulation Dosing: Description   INR at goal. Continue current weekly dose of 5 mg Tues, Thurs, Sat and 2.5 mg all other days. Return in 4 weeks.       INR today: Therapeutic. Continues to be therapeutic on current dose. Denies any complains of bleeding or bruising symptoms. Denies any other relevant changes in her diet, medications, or lifestyle. Vit K goal of 1-2 servings/week. Will continue to monitor and follow up after PCP visit.    PLAN Weekly dose was unchanged by 0% to 25 mg/week. Continue current dose of 5 mg every Tues, Thurs and Sat and 2.5 mg all other days. Recheck INR in 4 weeks  Patient Instructions  INR at goal. Continue current weekly dose of 5 mg Tues, Thurs, Sat and 2.5  mg all other days. Return in 4 weeks.   Patient advised to contact clinic or seek medical attention if signs/symptoms of bleeding or thromboembolism occur.  Patient verbalized understanding by repeating back information and was advised to contact me if further medication-related questions arise.   Follow-up Return in about 4 weeks (around 05/31/2020).  Alysia Penna, PharmD  15 minutes spent face-to-face with the patient during the encounter. 50% of time spent on education, including signs/sx bleeding and clotting, as well as food and drug interactions with warfarin. 50% of time was spent on fingerprick POC INR sample collection,processing, results determination, and documentation

## 2020-05-19 ENCOUNTER — Telehealth: Payer: Self-pay | Admitting: Hematology and Oncology

## 2020-05-19 NOTE — Telephone Encounter (Signed)
Scheduled appointment per new patient referral. Spoke to patient who is aware of appointment date and time.  

## 2020-05-25 ENCOUNTER — Other Ambulatory Visit: Payer: Self-pay | Admitting: Cardiology

## 2020-05-25 DIAGNOSIS — I10 Essential (primary) hypertension: Secondary | ICD-10-CM

## 2020-05-30 ENCOUNTER — Ambulatory Visit: Payer: Medicare Other | Admitting: Pharmacist

## 2020-05-30 ENCOUNTER — Other Ambulatory Visit: Payer: Self-pay

## 2020-05-30 DIAGNOSIS — Z5181 Encounter for therapeutic drug level monitoring: Secondary | ICD-10-CM

## 2020-05-30 DIAGNOSIS — Z7901 Long term (current) use of anticoagulants: Secondary | ICD-10-CM

## 2020-05-30 DIAGNOSIS — I480 Paroxysmal atrial fibrillation: Secondary | ICD-10-CM

## 2020-05-30 LAB — POCT INR: INR: 2.3 (ref 2.0–3.0)

## 2020-05-30 NOTE — Patient Instructions (Addendum)
INR at goal. Continue current weekly dose of 5 mg Tues, Thurs, Sat and 2.5 mg all other days. Return in 5 weeks.

## 2020-05-30 NOTE — Progress Notes (Signed)
Anticoagulation Management Gregory [name] is a 74 y.o. male who reports to the clinic for monitoring of warfarin treatment.    Indication: atrial fibrillation CHA2DS2 Vasc Score 4 (Age 17-74, HTN hx, DM Hx, Vascular disease hx) , HAS-BLED 1 (age>65)  Duration: indefinite Supervising physician: Adrian Prows  Anticoagulation Clinic Visit History:  Patient does not report signs/symptoms of bleeding or thromboembolism   Other recent changes: No changes in diet, lifestyle.  Recent OV w/ PCP and pt was started on allopurinol 100 mg. Tolerating it well but still reports to have 1 flare up since starting allopurinol. Improved with ice and heat. Denies any NSAID use.   Anticoagulation Episode Summary    Current INR goal:  2.0-3.0  TTR:  76.9 % (1 y)  Next INR check:  07/04/2020  INR from last check:  2.3 (05/30/2020)  Weekly max warfarin dose:    Target end date:  Indefinite  INR check location:    Preferred lab:    Send INR reminders to:     Indications   Atrial fibrillation (HCC) [I48.91] Monitoring for long-term anticoagulant use [Z51.81 Z79.01]       Comments:         No Known Allergies  Current Outpatient Medications:  .  allopurinol (ZYLOPRIM) 100 MG tablet, Take 100 mg by mouth daily., Disp: , Rfl:  .  amLODipine (NORVASC) 5 MG tablet, TAKE 1 TABLET BY MOUTH  DAILY, Disp: 90 tablet, Rfl: 3 .  atorvastatin (LIPITOR) 20 MG tablet, TAKE 1 TABLET BY MOUTH ONCE DAILY AT  6PM, Disp: 90 tablet, Rfl: 1 .  carvedilol (COREG) 25 MG tablet, Take 25 mg by mouth 2 (two) times daily., Disp: , Rfl:  .  furosemide (LASIX) 20 MG tablet, TAKE 1 TABLET BY MOUTH ONCE DAILY IN THE MORNING, Disp: 90 tablet, Rfl: 2 .  hydrALAZINE (APRESOLINE) 100 MG tablet, Take 100 mg by mouth 2 (two) times daily. , Disp: , Rfl:  .  metFORMIN (GLUCOPHAGE) 500 MG tablet, Take 500 mg by mouth daily., Disp: , Rfl:  .  spironolactone (ALDACTONE) 25 MG tablet, TAKE 1 TABLET BY MOUTH ONCE DAILY IN THE MORNING, Disp: 90  tablet, Rfl: 0 .  Tamsulosin HCl (FLOMAX) 0.4 MG CAPS, Take 0.4 mg by mouth daily., Disp: , Rfl:  .  valsartan (DIOVAN) 80 MG tablet, Take 80 mg by mouth daily., Disp: , Rfl:  .  warfarin (COUMADIN) 5 MG tablet, Take 1 tablet (5 mg total) by mouth daily. (Patient taking differently: Take 2.5-5 mg by mouth See admin instructions. 5 mg (5 mg x 1) every Tue, Thu, Sat; 2.5 mg (5 mg x 0.5) all other days. Refer to most recent anticoagulation note for most accurate dosing instructions), Disp: 90 tablet, Rfl: 3 .  colchicine 0.6 MG tablet, Take 0.6 mg by mouth daily as needed (gout flare up).  (Patient not taking: Reported on 05/30/2020), Disp: , Rfl:   Current Facility-Administered Medications:  .  0.9 %  sodium chloride infusion, 500 mL, Intravenous, Continuous, Pyrtle, [name], MD Past Medical History:  Diagnosis Date  . CAD (coronary artery disease)   . COPD (chronic obstructive pulmonary disease) ([place])   . Diabetes mellitus   . Heart attack ([place])   . Hyperlipidemia   . Hypertension   . Neuromuscular disorder ([place])   . SOB (shortness of breath) on exertion     ASSESSMENT  Recent Results: The most recent result is correlated with 25 mg per week:  Lab Results  Component  Value Date   INR 2.3 05/30/2020   INR 3.0 05/03/2020   INR 2.2 04/05/2020    Anticoagulation Dosing: Description   INR at goal. Continue current weekly dose of 5 mg Tues, Thurs, Sat and 2.5 mg all other days. Return in 5 weeks.       INR today: Therapeutic. Continues to be therapeutic on current dose. Denies any complains of bleeding or bruising symptoms. Denies any other relevant changes in her diet, medications, or lifestyle. Vit K goal of 1-2 servings/week. Will continue to monitor and follow up chronically.    PLAN Weekly dose was unchanged by 0% to 25 mg/week. Continue current dose of 5 mg every Tues, Thurs and Sat and 2.5 mg all other days. Recheck INR in 5 weeks  Patient Instructions  INR at goal. Continue  current weekly dose of 5 mg Tues, Thurs, Sat and 2.5 mg all other days. Return in 5 weeks.   Patient advised to contact clinic or seek medical attention if signs/symptoms of bleeding or thromboembolism occur.  Patient verbalized understanding by repeating back information and was advised to contact me if further medication-related questions arise.   Follow-up Return in about 5 weeks (around 07/04/2020).  Alysia Penna, PharmD  15 minutes spent face-to-face with the patient during the encounter. 50% of time spent on education, including signs/sx bleeding and clotting, as well as food and drug interactions with warfarin. 50% of time was spent on fingerprick POC INR sample collection,processing, results determination, and documentation

## 2020-05-31 ENCOUNTER — Ambulatory Visit: Payer: Medicare Other

## 2020-06-01 ENCOUNTER — Inpatient Hospital Stay: Payer: Medicare Other | Attending: Hematology and Oncology | Admitting: Hematology and Oncology

## 2020-06-01 ENCOUNTER — Encounter: Payer: Self-pay | Admitting: Hematology and Oncology

## 2020-06-01 ENCOUNTER — Inpatient Hospital Stay: Payer: Medicare Other

## 2020-06-01 ENCOUNTER — Other Ambulatory Visit: Payer: Self-pay

## 2020-06-01 VITALS — BP 107/68 | HR 43 | Temp 97.4°F | Resp 18 | Ht 71.0 in | Wt 254.6 lb

## 2020-06-01 DIAGNOSIS — Z833 Family history of diabetes mellitus: Secondary | ICD-10-CM | POA: Diagnosis not present

## 2020-06-01 DIAGNOSIS — R894 Abnormal immunological findings in specimens from other organs, systems and tissues: Secondary | ICD-10-CM

## 2020-06-01 DIAGNOSIS — I252 Old myocardial infarction: Secondary | ICD-10-CM

## 2020-06-01 DIAGNOSIS — Z7901 Long term (current) use of anticoagulants: Secondary | ICD-10-CM

## 2020-06-01 DIAGNOSIS — Z87891 Personal history of nicotine dependence: Secondary | ICD-10-CM | POA: Diagnosis not present

## 2020-06-01 DIAGNOSIS — D472 Monoclonal gammopathy: Secondary | ICD-10-CM

## 2020-06-01 DIAGNOSIS — E785 Hyperlipidemia, unspecified: Secondary | ICD-10-CM | POA: Insufficient documentation

## 2020-06-01 DIAGNOSIS — I4891 Unspecified atrial fibrillation: Secondary | ICD-10-CM | POA: Diagnosis not present

## 2020-06-01 DIAGNOSIS — Z8249 Family history of ischemic heart disease and other diseases of the circulatory system: Secondary | ICD-10-CM | POA: Diagnosis not present

## 2020-06-01 DIAGNOSIS — Z7984 Long term (current) use of oral hypoglycemic drugs: Secondary | ICD-10-CM | POA: Diagnosis not present

## 2020-06-01 DIAGNOSIS — Z79899 Other long term (current) drug therapy: Secondary | ICD-10-CM | POA: Diagnosis not present

## 2020-06-01 DIAGNOSIS — J449 Chronic obstructive pulmonary disease, unspecified: Secondary | ICD-10-CM | POA: Insufficient documentation

## 2020-06-01 DIAGNOSIS — E1140 Type 2 diabetes mellitus with diabetic neuropathy, unspecified: Secondary | ICD-10-CM | POA: Insufficient documentation

## 2020-06-01 DIAGNOSIS — I2510 Atherosclerotic heart disease of native coronary artery without angina pectoris: Secondary | ICD-10-CM

## 2020-06-01 DIAGNOSIS — Z8349 Family history of other endocrine, nutritional and metabolic diseases: Secondary | ICD-10-CM | POA: Diagnosis not present

## 2020-06-01 DIAGNOSIS — N189 Chronic kidney disease, unspecified: Secondary | ICD-10-CM | POA: Diagnosis not present

## 2020-06-01 DIAGNOSIS — I10 Essential (primary) hypertension: Secondary | ICD-10-CM

## 2020-06-01 LAB — CBC WITH DIFFERENTIAL/PLATELET
Abs Immature Granulocytes: 0.02 10*3/uL (ref 0.00–0.07)
Basophils Absolute: 0.1 10*3/uL (ref 0.0–0.1)
Basophils Relative: 1 %
Eosinophils Absolute: 0.2 10*3/uL (ref 0.0–0.5)
Eosinophils Relative: 3 %
HCT: 39.8 % (ref 39.0–52.0)
Hemoglobin: 12.7 g/dL — ABNORMAL LOW (ref 13.0–17.0)
Immature Granulocytes: 0 %
Lymphocytes Relative: 26 %
Lymphs Abs: 1.6 10*3/uL (ref 0.7–4.0)
MCH: 27.1 pg (ref 26.0–34.0)
MCHC: 31.9 g/dL (ref 30.0–36.0)
MCV: 85 fL (ref 80.0–100.0)
Monocytes Absolute: 0.6 10*3/uL (ref 0.1–1.0)
Monocytes Relative: 9 %
Neutro Abs: 3.8 10*3/uL (ref 1.7–7.7)
Neutrophils Relative %: 61 %
Platelets: 204 10*3/uL (ref 150–400)
RBC: 4.68 MIL/uL (ref 4.22–5.81)
RDW: 13.8 % (ref 11.5–15.5)
WBC: 6.3 10*3/uL (ref 4.0–10.5)
nRBC: 0 % (ref 0.0–0.2)

## 2020-06-01 LAB — CMP (CANCER CENTER ONLY)
ALT: 9 U/L (ref 0–44)
AST: 11 U/L — ABNORMAL LOW (ref 15–41)
Albumin: 3.6 g/dL (ref 3.5–5.0)
Alkaline Phosphatase: 126 U/L (ref 38–126)
Anion gap: 10 (ref 5–15)
BUN: 16 mg/dL (ref 8–23)
CO2: 26 mmol/L (ref 22–32)
Calcium: 9.8 mg/dL (ref 8.9–10.3)
Chloride: 104 mmol/L (ref 98–111)
Creatinine: 1.79 mg/dL — ABNORMAL HIGH (ref 0.61–1.24)
GFR, Estimated: 39 mL/min — ABNORMAL LOW (ref >60)
Glucose, Bld: 92 mg/dL (ref 70–99)
Potassium: 3.8 mmol/L (ref 3.5–5.1)
Sodium: 140 mmol/L (ref 135–145)
Total Bilirubin: 0.8 mg/dL (ref 0.3–1.2)
Total Protein: 8.2 g/dL — ABNORMAL HIGH (ref 6.5–8.1)

## 2020-06-01 NOTE — Progress Notes (Signed)
[facility] NOTE  Patient Care Team: Sonia Side., FNP as PCP - General (Family Medicine)  CHIEF COMPLAINTS/PURPOSE OF CONSULTATION:  Abnormal labs.  ASSESSMENT & PLAN:  No problem-specific Assessment & Plan notes found for this encounter.  This is a very pleasant 74 year old male patient with past medical history significant for diabetes, hypertension, atrial fibrillation, chronic kidney disease who was referred to hematology for evaluation of abnormal labs Mr. Gregory [name] arrived to the appointment today by himself.  He denies any new symptoms except for baseline shortness of breath on exertion.  No B symptoms, new bone pains, change in breathing or urinary habits. Physical examination, healthy appearing 74 year old male patient with no palpable lymphadenopathy or hepatosplenomegaly. I reviewed his labs which showed hemoglobin of 12.2, creatinine of 1.6-1.9, normal calcium, normal total protein, normal alkaline phosphatase.  No monoclonal spike observed.  Kappa lambda ratio however elevated at 3.5 with kappa light chain of 109. At this time I have low suspicion for monoclonal gammopathy but given the elevated kappa lambda ratio over 3.1, recommended repeating labs today including CBC, CMP, SPEP, kappa lambda ratio normal globulin family.   If there is a continued spike in the kappa lambda ratio, we might proceed with bone marrow biopsy and skeletal survey.  Mostly the spike in kappa lambda ratio could be indeed related to acute on chronic kidney disease I however do not see an urgent need to proceed with bone marrow biopsy or skeletal surveillance and recommended to follow-up with me in 6 weeks and possibly to repeat labs at that time which will guide our recommendations. We will also check for urine electrophoresis at his next visit. Age-appropriate cancer screening recommended. With regards to atrial fibrillation he had questions if he can take any novel  anticoagulants, encouraged to discuss with his cardiologist about novel anticoagulant coverage with his insurance.  Thank you for consulting [place] in the care of this patient.  Please not hesitate to contact us with any additional questions or concerns   Orders Placed This Encounter  Procedures  . CBC with Differential/Platelet    Standing Status:   Standing    Number of Occurrences:   22    Standing Expiration Date:   06/01/2021  . CMP ([facility] only)    Standing Status:   Future    Standing Expiration Date:   06/01/2021  . Kappa/lambda light chains    Standing Status:   Standing    Number of Occurrences:   22    Standing Expiration Date:   06/01/2021  . SPEP with reflex to IFE    Standing Status:   Future    Standing Expiration Date:   06/01/2021  . IgG, IgA, IgM    Standing Status:   Future    Standing Expiration Date:   06/01/2021    HISTORY OF PRESENTING ILLNESS:  Gregory [name] 74 y.o. male is here because of some abnormal labs, specifically increased free light chains.  This is a very pleasant 74 year old male patient with past medical history significant for type 2 diabetes mellitus, hypertension, atrial fibrillation on anticoagulation who presented to hematology for worsening chronic kidney disease and elevated kappa light chain ratio.  Gregory [name] is a good historian.  He was wondering why he was referred to hematology and oncology.  He recently visited Dr. Candiss Norse, his nephrologist who noted that his creatinine has been increasing and ordered some labs which showed elevated kappa lambda light chain ratio and hence referred to  hematology for evaluation of monoclonal gammopathy.   He tells me that he does get some shortness of breath with exertion which is his baseline, he denies any chest pain, chest pressure or worsening shortness of breath. NO B symptoms except for baseline fatigue. He denies any changes in bowel habits, hematochezia or melena.  No polyuria or nocturia.  He tells  me his hemoglobin A1c is 5.7% and his diabetes is very well controlled.  He does not have any retinopathy from diabetes.  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 He retired from [employer] company.   He quit smoking in 2005. No family history or personal history of cancers. He feels well overall.  Rest of the pertinent 10 point ROS as mentioned below and negative  REVIEW OF SYSTEMS:   Constitutional: Denies fevers, chills or abnormal night sweats Eyes: Denies blurriness of vision, double vision or watery eyes Ears, nose, mouth, throat, and face: Denies mucositis or sore throat Respiratory: Denies cough, dyspnea or wheezes Cardiovascular: Denies palpitation, chest discomfort or lower extremity swelling Gastrointestinal:  Denies nausea, heartburn or change in bowel habits Skin: Denies abnormal skin rashes Lymphatics: Denies new lymphadenopathy or easy bruising Neurological:Denies numbness, tingling or new weaknesses Behavioral/Psych: Mood is stable, no new changes  All other systems were reviewed with the patient and are negative.  MEDICAL HISTORY:  Past Medical History:  Diagnosis Date  . CAD (coronary artery disease)   . COPD (chronic obstructive pulmonary disease) ([place])   . Diabetes mellitus   . Heart attack ([place])   . Hyperlipidemia   . Hypertension   . Neuromuscular disorder ([place])   . SOB (shortness of breath) on exertion     SURGICAL HISTORY: Past Surgical History:  Procedure Laterality Date  . BACK SURGERY    . CARDIAC CATHETERIZATION    . CORONARY STENT PLACEMENT    . KNEE ARTHROPLASTY    . LEFT HEART CATH AND CORONARY ANGIOGRAPHY N/A 03/19/2019   Procedure: LEFT HEART CATH AND CORONARY ANGIOGRAPHY;  Surgeon: Nigel Mormon, MD;  Location: [facility];  Service: Cardiovascular;  Laterality: N/A;  . LEFT HEART CATHETERIZATION WITH CORONARY ANGIOGRAM N/A 03/03/2012   Procedure: LEFT HEART CATHETERIZATION WITH CORONARY ANGIOGRAM;  Surgeon:  Laverda Page, MD;  Location: [facility] CATH LAB;  Service: Cardiovascular;  Laterality: N/A;  . PERCUTANEOUS CORONARY STENT INTERVENTION (PCI-S)  03/03/2012   Procedure: PERCUTANEOUS CORONARY STENT INTERVENTION (PCI-S);  Surgeon: Laverda Page, MD;  Location: [facility] CATH LAB;  Service: Cardiovascular;;    SOCIAL HISTORY: Social History   Socioeconomic History  . Marital status: Married    Spouse name: Not on file  . Number of children: 3  . Years of education: Not on file  . Highest education level: Not on file  Occupational History  . Occupation: retired  Tobacco Use  . Smoking status: Former Smoker    Packs/day: 1.00    Years: 30.00    Pack years: 30.00    Types: Cigarettes    Quit date: 07/30/2003    Years since quitting: 16.8  . Smokeless tobacco: Never Used  Vaping Use  . Vaping Use: Never used  Substance and Sexual Activity  . Alcohol use: Yes    Alcohol/week: 0.0 standard drinks    Comment: wine rarely  . Drug use: No  . Sexual activity: Not on file  Other Topics Concern  . Not on file  Social History Narrative  . Not on file  Social Determinants of Health   Financial Resource Strain: Not on file  Food Insecurity: Not on file  Transportation Needs: Not on file  Physical Activity: Not on file  Stress: Not on file  Social Connections: Not on file  Intimate Partner Violence: Not on file    FAMILY HISTORY: Family History  Problem Relation Age of Onset  . Hypertension Father   . Hyperlipidemia Father   . Stroke Father   . Hypertension Paternal Grandfather   . Hyperlipidemia Paternal Grandfather   . Diabetes Other        maternal uncles  . Diabetes Sister   . Cancer Mother   . Colon cancer Neg Hx     ALLERGIES:  has No Known Allergies.  MEDICATIONS:  Current Outpatient Medications  Medication Sig Dispense Refill  . allopurinol (ZYLOPRIM) 100 MG tablet Take 100 mg by mouth daily.    Marland Kitchen amLODipine (NORVASC) 5 MG tablet TAKE 1 TABLET BY MOUTH  DAILY 90  tablet 3  . atorvastatin (LIPITOR) 20 MG tablet TAKE 1 TABLET BY MOUTH ONCE DAILY AT  6PM 90 tablet 1  . carvedilol (COREG) 25 MG tablet Take 25 mg by mouth 2 (two) times daily.    . furosemide (LASIX) 20 MG tablet TAKE 1 TABLET BY MOUTH ONCE DAILY IN THE MORNING 90 tablet 2  . hydrALAZINE (APRESOLINE) 100 MG tablet Take 100 mg by mouth 2 (two) times daily.     . metFORMIN (GLUCOPHAGE) 500 MG tablet Take 500 mg by mouth daily.    Marland Kitchen spironolactone (ALDACTONE) 25 MG tablet TAKE 1 TABLET BY MOUTH ONCE DAILY IN THE MORNING 90 tablet 0  . Tamsulosin HCl (FLOMAX) 0.4 MG CAPS Take 0.4 mg by mouth daily.    . valsartan (DIOVAN) 80 MG tablet Take 80 mg by mouth daily.    Marland Kitchen warfarin (COUMADIN) 5 MG tablet Take 1 tablet (5 mg total) by mouth daily. (Patient taking differently: Take 2.5-5 mg by mouth See admin instructions. 5 mg (5 mg x 1) every Tue, Thu, Sat; 2.5 mg (5 mg x 0.5) all other days. Refer to most recent anticoagulation note for most accurate dosing instructions) 90 tablet 3   Current Facility-Administered Medications  Medication Dose Route Frequency Provider Last Rate Last Admin  . 0.9 %  sodium chloride infusion  500 mL Intravenous Continuous Pyrtle, [name], MD         PHYSICAL EXAMINATION: ECOG PERFORMANCE STATUS: 0 - Asymptomatic  Vitals:   06/01/20 1054  BP: 107/68  Pulse: (!) 43  Resp: 18  Temp: (!) 97.4 F (36.3 C)  SpO2: 100%   Filed Weights   06/01/20 1054  Weight: 254 lb 9.6 oz (115.5 kg)    GENERAL:alert, no distress and comfortable, walks with a cane. SKIN: skin color, texture, turgor are normal, no rashes or significant lesions EYES: normal, conjunctiva are pink and non-injected, sclera clear OROPHARYNX:no exudate, no erythema and lips, buccal mucosa, and tongue normal  NECK: supple, thyroid normal size, non-tender, without nodularity LYMPH:  no palpable lymphadenopathy in the cervical, axillary or inguinal LUNGS: clear to auscultation and percussion with normal  breathing effort HEART: regular rate & rhythm. S 3 heard. BLE edema. ABDOMEN:abdomen soft, non-tender and normal bowel sounds Musculoskeletal:no cyanosis of digits and no clubbing  PSYCH: alert & oriented x 3 with fluent speech NEURO: no focal motor/sensory deficits  LABORATORY DATA:  I have reviewed the data as listed Lab Results  Component Value Date   WBC 7.4 09/09/2019  HGB 12.8 (L) 09/09/2019   HCT 40.9 09/09/2019   MCV 86.5 09/09/2019   PLT 166 09/09/2019     Chemistry      Component Value Date/Time   NA 141 09/13/2019 1310   K 3.7 09/13/2019 1310   CL 100 09/13/2019 1310   CO2 26 09/13/2019 1310   BUN 29 (H) 09/13/2019 1310   CREATININE 1.97 (H) 09/13/2019 1310      Component Value Date/Time   CALCIUM 9.3 09/13/2019 1310   ALKPHOS 96 09/09/2019 1108   AST 17 09/09/2019 1108   ALT 19 09/09/2019 1108   BILITOT 2.1 (H) 09/09/2019 1108   BILITOT 0.7 07/27/2019 1057      CMP with normal calcium, normal total protein and normal alk phos.    Creatinine of 1.68 Hb of 12 No M spike on SPEP Urine protein minimal. Monoclonal protein on urine not examined.  RADIOGRAPHIC STUDIES: I have personally reviewed the radiological images as listed and agreed with the findings in the report. No results found.   All questions were answered. The patient knows to call the clinic with any problems, questions or concerns. I spent 45 minutes in the care of this patient including H and P, review of records, counseling and coordination of care.    Benay Pike, MD 06/01/2020 11:54 AM

## 2020-06-02 LAB — IGG, IGA, IGM
IgA: 542 mg/dL — ABNORMAL HIGH (ref 61–437)
IgG (Immunoglobin G), Serum: 1614 mg/dL — ABNORMAL HIGH (ref 603–1613)
IgM (Immunoglobulin M), Srm: 29 mg/dL (ref 15–143)

## 2020-06-02 LAB — PROTEIN ELECTROPHORESIS, SERUM, WITH REFLEX
A/G Ratio: 0.9 (ref 0.7–1.7)
Albumin ELP: 3.5 g/dL (ref 2.9–4.4)
Alpha-1-Globulin: 0.2 g/dL (ref 0.0–0.4)
Alpha-2-Globulin: 0.8 g/dL (ref 0.4–1.0)
Beta Globulin: 1.2 g/dL (ref 0.7–1.3)
Gamma Globulin: 1.7 g/dL (ref 0.4–1.8)
Globulin, Total: 3.9 g/dL (ref 2.2–3.9)
Total Protein ELP: 7.4 g/dL (ref 6.0–8.5)

## 2020-06-02 LAB — KAPPA/LAMBDA LIGHT CHAINS
Kappa free light chain: 109.4 mg/L — ABNORMAL HIGH (ref 3.3–19.4)
Kappa, lambda light chain ratio: 3.52 — ABNORMAL HIGH (ref 0.26–1.65)
Lambda free light chains: 31.1 mg/L — ABNORMAL HIGH (ref 5.7–26.3)

## 2020-06-09 ENCOUNTER — Ambulatory Visit: Payer: Medicare Other | Admitting: Pharmacist

## 2020-06-09 ENCOUNTER — Other Ambulatory Visit: Payer: Self-pay

## 2020-06-09 DIAGNOSIS — I129 Hypertensive chronic kidney disease with stage 1 through stage 4 chronic kidney disease, or unspecified chronic kidney disease: Secondary | ICD-10-CM

## 2020-06-09 DIAGNOSIS — Z5181 Encounter for therapeutic drug level monitoring: Secondary | ICD-10-CM

## 2020-06-09 DIAGNOSIS — Z7901 Long term (current) use of anticoagulants: Secondary | ICD-10-CM

## 2020-06-09 DIAGNOSIS — I10 Essential (primary) hypertension: Secondary | ICD-10-CM

## 2020-06-09 DIAGNOSIS — N184 Chronic kidney disease, stage 4 (severe): Secondary | ICD-10-CM

## 2020-06-09 DIAGNOSIS — I4821 Permanent atrial fibrillation: Secondary | ICD-10-CM

## 2020-06-09 LAB — POCT INR: INR: 2.6 (ref 2.0–3.0)

## 2020-06-09 NOTE — Progress Notes (Deleted)
ICD-10-CM   1. Permanent atrial fibrillation (HCC)  I48.21   2. Monitoring for long-term anticoagulant use  Z51.81    Z79.01   3. CKD stage 4 secondary to hypertension (HCC)  I12.9    N18.4   4. Essential hypertension  I10

## 2020-06-09 NOTE — Progress Notes (Signed)
Anticoagulation Management Gregory [name] is a 74 y.o. male who reports to the clinic for monitoring of warfarin treatment.    Indication: atrial fibrillation CHA2DS2 Vasc Score 4 (Age 59-74, HTN hx, DM Hx, Vascular disease hx) , HAS-BLED 1 (age>65)  Duration: indefinite Supervising physician: Adrian Prows  Anticoagulation Clinic Visit History:  Patient does not report signs/symptoms of bleeding or thromboembolism   Other recent changes: No changes in diet, lifestyle.  Recent OV w/ PCP and pt was started on allopurinol 100 mg. Tolerating it well but still reports to have 1 flare up since. Was recently started on medrol dose pack for gout flare up. Day 2/6 of dose pack  Anticoagulation Episode Summary    Current INR goal:  2.0-3.0  TTR:  77.5 % (1 y)  Next INR check:  07/18/2020  INR from last check:  2.6 (06/09/2020)  Weekly max warfarin dose:    Target end date:  Indefinite  INR check location:    Preferred lab:    Send INR reminders to:     Indications   Atrial fibrillation (HCC) [I48.91] Monitoring for long-term anticoagulant use [Z51.81 Z79.01]       Comments:         No Known Allergies  Current Outpatient Medications:  .  allopurinol (ZYLOPRIM) 100 MG tablet, Take 100 mg by mouth daily., Disp: , Rfl:  .  amLODipine (NORVASC) 5 MG tablet, TAKE 1 TABLET BY MOUTH  DAILY, Disp: 90 tablet, Rfl: 3 .  atorvastatin (LIPITOR) 20 MG tablet, TAKE 1 TABLET BY MOUTH ONCE DAILY AT  6PM, Disp: 90 tablet, Rfl: 1 .  carvedilol (COREG) 25 MG tablet, Take 25 mg by mouth 2 (two) times daily., Disp: , Rfl:  .  furosemide (LASIX) 20 MG tablet, TAKE 1 TABLET BY MOUTH ONCE DAILY IN THE MORNING, Disp: 90 tablet, Rfl: 2 .  hydrALAZINE (APRESOLINE) 100 MG tablet, Take 100 mg by mouth 2 (two) times daily. , Disp: , Rfl:  .  spironolactone (ALDACTONE) 25 MG tablet, TAKE 1 TABLET BY MOUTH ONCE DAILY IN THE MORNING, Disp: 90 tablet, Rfl: 0 .  Tamsulosin HCl (FLOMAX) 0.4 MG CAPS, Take 0.4 mg by mouth  daily., Disp: , Rfl:  .  valsartan (DIOVAN) 80 MG tablet, Take 80 mg by mouth daily., Disp: , Rfl:  .  warfarin (COUMADIN) 5 MG tablet, Take 1 tablet (5 mg total) by mouth daily. (Patient taking differently: Take 2.5-5 mg by mouth See admin instructions. 5 mg (5 mg x 1) every Tue, Thu, Sat; 2.5 mg (5 mg x 0.5) all other days. Refer to most recent anticoagulation note for most accurate dosing instructions), Disp: 90 tablet, Rfl: 3 .  colchicine 0.6 MG tablet, Take 1 tablet by mouth as needed. Take 2 tabs initially then take 1 tab in 1 hr for gout attack, Disp: , Rfl:  .  glipiZIDE (GLUCOTROL) 5 MG tablet, Take 2.5 mg by mouth daily before breakfast., Disp: , Rfl:  .  methylPREDNISolone (MEDROL DOSEPAK) 4 MG TBPK tablet, Take by mouth., Disp: , Rfl:   Current Facility-Administered Medications:  .  0.9 %  sodium chloride infusion, 500 mL, Intravenous, Continuous, Pyrtle, [name], MD Past Medical History:  Diagnosis Date  . CAD (coronary artery disease)   . COPD (chronic obstructive pulmonary disease) ([place])   . Diabetes mellitus   . Heart attack ([place])   . Hyperlipidemia   . Hypertension   . Neuromuscular disorder ([place])   . SOB (shortness of breath) on  exertion     ASSESSMENT  Recent Results: The most recent result is correlated with 25 mg per week:  Lab Results  Component Value Date   INR 2.6 06/09/2020   INR 2.3 05/30/2020   INR 3.0 05/03/2020    Anticoagulation Dosing: Description   INR at goal. Continue current weekly dose of 5 mg Tues, Thurs, Sat and 2.5 mg all other days. Return in 5 weeks.       INR today: Therapeutic. Continues to be therapeutic on current dose. Denies any complains of bleeding or bruising symptoms. Denies any other relevant changes in her diet, medications, or lifestyle. Vit K goal of 1-2 servings/week. Will continue to monitor and follow up chronically.    PLAN Weekly dose was unchanged by 0% to 25 mg/week. Continue current dose of 5 mg every Tues, Thurs  and Sat and 2.5 mg all other days. Recheck INR in 5 weeks  Patient Instructions  INR at goal. Continue current weekly dose of 5 mg Tues, Thurs, Sat and 2.5 mg all other days. Return in 5 weeks.   Patient advised to contact clinic or seek medical attention if signs/symptoms of bleeding or thromboembolism occur.  Patient verbalized understanding by repeating back information and was advised to contact me if further medication-related questions arise.   Follow-up Return in about 6 weeks (around 07/18/2020).  Alysia Penna, PharmD  15 minutes spent face-to-face with the patient during the encounter. 50% of time spent on education, including signs/sx bleeding and clotting, as well as food and drug interactions with warfarin. 50% of time was spent on fingerprick POC INR sample collection,processing, results determination, and documentation

## 2020-06-09 NOTE — Patient Instructions (Signed)
INR at goal. Continue current weekly dose of 5 mg Tues, Thurs, Sat and 2.5 mg all other days. Return in 5 weeks.

## 2020-06-20 ENCOUNTER — Other Ambulatory Visit: Payer: Self-pay

## 2020-06-20 ENCOUNTER — Other Ambulatory Visit: Payer: Self-pay | Admitting: Family

## 2020-06-20 ENCOUNTER — Ambulatory Visit
Admission: RE | Admit: 2020-06-20 | Discharge: 2020-06-20 | Disposition: A | Discharge status: Home / Self Care | Payer: Medicare Other | Source: Ambulatory Visit | Attending: Family | Admitting: Family

## 2020-06-20 DIAGNOSIS — M79672 Pain in left foot: Secondary | ICD-10-CM

## 2020-07-13 ENCOUNTER — Telehealth: Payer: Self-pay | Admitting: Hematology and Oncology

## 2020-07-13 ENCOUNTER — Ambulatory Visit: Payer: Medicare Other | Admitting: Hematology and Oncology

## 2020-07-13 NOTE — Progress Notes (Deleted)
[place] NOTE  Patient Care Team: Sonia Side., FNP as PCP - General (Family Medicine)  CHIEF COMPLAINTS/PURPOSE OF CONSULTATION:  Abnormal labs.  ASSESSMENT & PLAN:  No problem-specific Assessment & Plan notes found for this encounter.  This is a very pleasant 75 year old male patient with past medical history significant for diabetes, hypertension, atrial fibrillation, chronic kidney disease who was referred to hematology for evaluation of abnormal labs  Mr. Gregory [name] arrived to the appointment today by himself.  He denies any new symptoms except for baseline shortness of breath on exertion.  No B symptoms, new bone pains, change in breathing or urinary habits. Physical examination, healthy appearing 75 year old male patient with no palpable lymphadenopathy or hepatosplenomegaly. I reviewed his labs which showed hemoglobin of 12.2, creatinine of 1.6-1.9, normal calcium, normal total protein, normal alkaline phosphatase.  No monoclonal spike observed.  Kappa lambda ratio however elevated at 3.5 with kappa light chain of 109. At this time I have low suspicion for monoclonal gammopathy but given the elevated kappa lambda ratio over 3.1, recommended repeating labs  including CBC, CMP, SPEP, kappa lambda ratio normal globulin family, UPEP in 6 weeks and FU with me.    Thank you for consulting [place] in the care of this patient.  Please not hesitate to contact us with any additional questions or concerns   No orders of the defined types were placed in this encounter.   HISTORY OF PRESENTING ILLNESS:  Gregory [name] 75 y.o. male is here because of some abnormal labs, specifically increased free light chains.  This is a very pleasant 75 year old male patient with past medical history significant for type 2 diabetes mellitus, hypertension, atrial fibrillation on anticoagulation who presented to hematology for worsening chronic kidney disease and elevated kappa light  chain ratio.  Mr. [name] is a good historian.  He was wondering why he was referred to hematology and oncology.  He recently visited Dr. Candiss Norse, his nephrologist who noted that his creatinine has been increasing and ordered some labs which showed elevated kappa lambda light chain ratio and hence referred to hematology for evaluation of monoclonal gammopathy.     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   He quit smoking in 2005.   Rest of the pertinent 10 point ROS as mentioned below and negative  REVIEW OF SYSTEMS:   Constitutional: Denies fevers, chills or abnormal night sweats Eyes: Denies blurriness of vision, double vision or watery eyes Ears, nose, mouth, throat, and face: Denies mucositis or sore throat Respiratory: Denies cough, dyspnea or wheezes Cardiovascular: Denies palpitation, chest discomfort or lower extremity swelling Gastrointestinal:  Denies nausea, heartburn or change in bowel habits Skin: Denies abnormal skin rashes Lymphatics: Denies new lymphadenopathy or easy bruising Neurological:Denies numbness, tingling or new weaknesses Behavioral/Psych: Mood is stable, no new changes  All other systems were reviewed with the patient and are negative.  MEDICAL HISTORY:  Past Medical History:  Diagnosis Date  . CAD (coronary artery disease)   . COPD (chronic obstructive pulmonary disease) ([place])   . Diabetes mellitus   . Heart attack ([place])   . Hyperlipidemia   . Hypertension   . Neuromuscular disorder ([place])   . SOB (shortness of breath) on exertion     SURGICAL HISTORY: Past Surgical History:  Procedure Laterality Date  . BACK SURGERY    . CARDIAC CATHETERIZATION    . CORONARY STENT PLACEMENT    . KNEE ARTHROPLASTY    .  LEFT HEART CATH AND CORONARY ANGIOGRAPHY N/A 03/19/2019   Procedure: LEFT HEART CATH AND CORONARY ANGIOGRAPHY;  Surgeon: Nigel Mormon, MD;  Location: [place] [facility];  Service: Cardiovascular;   Laterality: N/A;  . LEFT HEART CATHETERIZATION WITH CORONARY ANGIOGRAM N/A 03/03/2012   Procedure: LEFT HEART CATHETERIZATION WITH CORONARY ANGIOGRAM;  Surgeon: Laverda Page, MD;  Location: [facility] CATH LAB;  Service: Cardiovascular;  Laterality: N/A;  . PERCUTANEOUS CORONARY STENT INTERVENTION (PCI-S)  03/03/2012   Procedure: PERCUTANEOUS CORONARY STENT INTERVENTION (PCI-S);  Surgeon: Laverda Page, MD;  Location: [facility] CATH LAB;  Service: Cardiovascular;;    SOCIAL HISTORY: Social History   Socioeconomic History  . Marital status: Married    Spouse name: Not on file  . Number of children: 3  . Years of education: Not on file  . Highest education level: Not on file  Occupational History  . Occupation: retired  Tobacco Use  . Smoking status: Former Smoker    Packs/day: 1.00    Years: 30.00    Pack years: 30.00    Types: Cigarettes    Quit date: 07/30/2003    Years since quitting: 16.9  . Smokeless tobacco: Never Used  Vaping Use  . Vaping Use: Never used  Substance and Sexual Activity  . Alcohol use: Yes    Alcohol/week: 0.0 standard drinks    Comment: wine rarely  . Drug use: No  . Sexual activity: Not on file  Other Topics Concern  . Not on file  Social History Narrative  . Not on file   Social Determinants of Health   Financial Resource Strain: Not on file  Food Insecurity: Not on file  Transportation Needs: Not on file  Physical Activity: Not on file  Stress: Not on file  Social Connections: Not on file  Intimate Partner Violence: Not on file    FAMILY HISTORY: Family History  Problem Relation Age of Onset  . Hypertension Father   . Hyperlipidemia Father   . Stroke Father   . Hypertension Paternal Grandfather   . Hyperlipidemia Paternal Grandfather   . Diabetes Other        maternal uncles  . Diabetes Sister   . Cancer Mother   . Colon cancer Neg Hx     ALLERGIES:  has No Known Allergies.  MEDICATIONS:  Current Outpatient Medications  Medication  Sig Dispense Refill  . allopurinol (ZYLOPRIM) 100 MG tablet Take 100 mg by mouth daily.    Marland Kitchen amLODipine (NORVASC) 5 MG tablet TAKE 1 TABLET BY MOUTH  DAILY 90 tablet 3  . atorvastatin (LIPITOR) 20 MG tablet TAKE 1 TABLET BY MOUTH ONCE DAILY AT  6PM 90 tablet 1  . carvedilol (COREG) 25 MG tablet Take 25 mg by mouth 2 (two) times daily.    . colchicine 0.6 MG tablet Take 1 tablet by mouth as needed. Take 2 tabs initially then take 1 tab in 1 hr for gout attack    . furosemide (LASIX) 20 MG tablet TAKE 1 TABLET BY MOUTH ONCE DAILY IN THE MORNING 90 tablet 2  . glipiZIDE (GLUCOTROL) 5 MG tablet Take 2.5 mg by mouth daily before breakfast.    . hydrALAZINE (APRESOLINE) 100 MG tablet Take 100 mg by mouth 2 (two) times daily.     . methylPREDNISolone (MEDROL DOSEPAK) 4 MG TBPK tablet Take by mouth.    . spironolactone (ALDACTONE) 25 MG tablet TAKE 1 TABLET BY MOUTH ONCE DAILY IN THE MORNING 90 tablet 0  . Tamsulosin  HCl (FLOMAX) 0.4 MG CAPS Take 0.4 mg by mouth daily.    . valsartan (DIOVAN) 80 MG tablet Take 80 mg by mouth daily.    Marland Kitchen warfarin (COUMADIN) 5 MG tablet Take 1 tablet (5 mg total) by mouth daily. (Patient taking differently: Take 2.5-5 mg by mouth See admin instructions. 5 mg (5 mg x 1) every Tue, Thu, Sat; 2.5 mg (5 mg x 0.5) all other days. Refer to most recent anticoagulation note for most accurate dosing instructions) 90 tablet 3   Current Facility-Administered Medications  Medication Dose Route Frequency Provider Last Rate Last Admin  . 0.9 %  sodium chloride infusion  500 mL Intravenous Continuous Pyrtle, [name], MD         PHYSICAL EXAMINATION: ECOG PERFORMANCE STATUS: 0 - Asymptomatic  There were no vitals filed for this visit. There were no vitals filed for this visit.  GENERAL:alert, no distress and comfortable, walks with a cane. SKIN: skin color, texture, turgor are normal, no rashes or significant lesions EYES: normal, conjunctiva are pink and non-injected, sclera  clear OROPHARYNX:no exudate, no erythema and lips, buccal mucosa, and tongue normal  NECK: supple, thyroid normal size, non-tender, without nodularity LYMPH:  no palpable lymphadenopathy in the cervical, axillary or inguinal LUNGS: clear to auscultation and percussion with normal breathing effort HEART: regular rate & rhythm. S 3 heard. BLE edema. ABDOMEN:abdomen soft, non-tender and normal bowel sounds Musculoskeletal:no cyanosis of digits and no clubbing  PSYCH: alert & oriented x 3 with fluent speech NEURO: no focal motor/sensory deficits  LABORATORY DATA:  I have reviewed the data as listed Lab Results  Component Value Date   WBC 6.3 06/01/2020   HGB 12.7 (L) 06/01/2020   HCT 39.8 06/01/2020   MCV 85.0 06/01/2020   PLT 204 06/01/2020     Chemistry      Component Value Date/Time   NA 140 06/01/2020 1158   NA 141 09/13/2019 1310   K 3.8 06/01/2020 1158   CL 104 06/01/2020 1158   CO2 26 06/01/2020 1158   BUN 16 06/01/2020 1158   BUN 29 (H) 09/13/2019 1310   CREATININE 1.79 (H) 06/01/2020 1158      Component Value Date/Time   CALCIUM 9.8 06/01/2020 1158   ALKPHOS 126 06/01/2020 1158   AST 11 (L) 06/01/2020 1158   ALT 9 06/01/2020 1158   BILITOT 0.8 06/01/2020 1158      CMP with normal calcium, normal total protein and normal alk phos.    Creatinine of 1.68 Hb of 12 No M spike on SPEP Urine protein minimal. Monoclonal protein on urine not examined.  RADIOGRAPHIC STUDIES: I have personally reviewed the radiological images as listed and agreed with the findings in the report. DG Foot Complete Left  Result Date: 06/20/2020 CLINICAL DATA:  Left foot pain EXAM: LEFT FOOT - COMPLETE 3+ VIEW COMPARISON:  None. FINDINGS: Negative for acute fracture. Two surgical pins in the distal first metatarsal. Mild hallux valgus deformity with mild degenerative change first MTP Pes planus. Degenerative change in spurring in the midfoot. Calcaneal spurring. IMPRESSION: Negative for  acute fracture. Electronically Signed   By: Franchot Gallo M.D.   On: 06/20/2020 11:35     All questions were answered. The patient knows to call the clinic with any problems, questions or concerns. I spent 30 minutes in the care of this patient including H and P, review of records, counseling, documentation and coordination of care.    Benay Pike, MD 07/13/2020 1:55 PM

## 2020-07-13 NOTE — Telephone Encounter (Signed)
[name] has been rescheduled to see Dr. Chryl Heck on 2/1 at 940am. Appt date and time has been given to the pt's wife.

## 2020-07-18 ENCOUNTER — Other Ambulatory Visit: Payer: Self-pay

## 2020-07-18 ENCOUNTER — Ambulatory Visit: Payer: Medicare Other | Admitting: Pharmacist

## 2020-07-18 DIAGNOSIS — Z7901 Long term (current) use of anticoagulants: Secondary | ICD-10-CM

## 2020-07-18 DIAGNOSIS — I480 Paroxysmal atrial fibrillation: Secondary | ICD-10-CM

## 2020-07-18 DIAGNOSIS — Z5181 Encounter for therapeutic drug level monitoring: Secondary | ICD-10-CM

## 2020-07-18 LAB — POCT INR: INR: 2 (ref 2.0–3.0)

## 2020-07-18 NOTE — Progress Notes (Signed)
Anticoagulation Management Gregory [name] is a 75 y.o. male who reports to the clinic for monitoring of warfarin treatment.    Indication: atrial fibrillation CHA2DS2 Vasc Score 4 (Age 73-74, HTN hx, DM Hx, Vascular disease hx) , HAS-BLED 1 (age>65)  Duration: indefinite Supervising physician: Adrian Prows  Anticoagulation Clinic Visit History:  Patient does not report signs/symptoms of bleeding or thromboembolism   Other recent changes: No changes in diet, lifestyle.  Recent allopurinol dose increased from 100 mg to 300 mg daily. Denies any recent gout flare up.   Anticoagulation Episode Summary    Current INR goal:  2.0-3.0  TTR:  79.6 % (1.1 y)  Next INR check:  08/02/2020  INR from last check:  2.0 (07/18/2020)  Weekly max warfarin dose:    Target end date:  Indefinite  INR check location:    Preferred lab:    Send INR reminders to:     Indications   Atrial fibrillation (HCC) [I48.91] Monitoring for long-term anticoagulant use [Z51.81 Z79.01]       Comments:         No Known Allergies  Current Outpatient Medications:  .  amLODipine (NORVASC) 5 MG tablet, TAKE 1 TABLET BY MOUTH  DAILY, Disp: 90 tablet, Rfl: 3 .  colchicine 0.6 MG tablet, Take 1 tablet by mouth as needed. Take 2 tabs initially then take 1 tab in 1 hr for gout attack, Disp: , Rfl:  .  spironolactone (ALDACTONE) 25 MG tablet, TAKE 1 TABLET BY MOUTH ONCE DAILY IN THE MORNING, Disp: 90 tablet, Rfl: 0 .  valsartan (DIOVAN) 80 MG tablet, Take 80 mg by mouth daily., Disp: , Rfl:  .  warfarin (COUMADIN) 5 MG tablet, Take 1 tablet (5 mg total) by mouth daily. (Patient taking differently: Take 2.5-5 mg by mouth See admin instructions. 5 mg (5 mg x 1) every Tue, Thu, Sat; 2.5 mg (5 mg x 0.5) all other days. Refer to most recent anticoagulation note for most accurate dosing instructions), Disp: 90 tablet, Rfl: 3 .  allopurinol (ZYLOPRIM) 300 MG tablet, Take 300 mg by mouth daily., Disp: , Rfl:  .  atorvastatin (LIPITOR)  20 MG tablet, TAKE 1 TABLET BY MOUTH ONCE DAILY AT  6PM, Disp: 90 tablet, Rfl: 1 .  carvedilol (COREG) 25 MG tablet, Take 25 mg by mouth 2 (two) times daily., Disp: , Rfl:  .  furosemide (LASIX) 20 MG tablet, TAKE 1 TABLET BY MOUTH ONCE DAILY IN THE MORNING, Disp: 90 tablet, Rfl: 2 .  glipiZIDE (GLUCOTROL) 5 MG tablet, Take 2.5 mg by mouth daily before breakfast., Disp: , Rfl:  .  hydrALAZINE (APRESOLINE) 100 MG tablet, Take 100 mg by mouth 2 (two) times daily. , Disp: , Rfl:  .  methylPREDNISolone (MEDROL DOSEPAK) 4 MG TBPK tablet, Take by mouth., Disp: , Rfl:  .  Tamsulosin HCl (FLOMAX) 0.4 MG CAPS, Take 0.4 mg by mouth daily., Disp: , Rfl:   Current Facility-Administered Medications:  .  0.9 %  sodium chloride infusion, 500 mL, Intravenous, Continuous, Pyrtle, [name], MD Past Medical History:  Diagnosis Date  . CAD (coronary artery disease)   . COPD (chronic obstructive pulmonary disease) ([place])   . Diabetes mellitus   . Heart attack ([place])   . Hyperlipidemia   . Hypertension   . Neuromuscular disorder ([place])   . SOB (shortness of breath) on exertion     ASSESSMENT  Recent Results: The most recent result is correlated with 25 mg per week:  Lab  Results  Component Value Date   INR 2.0 07/18/2020   INR 2.6 06/09/2020   INR 2.3 05/30/2020    Anticoagulation Dosing: Description   INR at goal. Continue current weekly dose of 5 mg Tues, Thurs, Sat and 2.5 mg all other days. Return in 2 weeks.       INR today: Therapeutic. Continues to be therapeutic on current dose. Noted DDI b/w allopurinol and warfarin that may result in increased anticoagulation effect. INR continues to remains therapeutic despite increased allopurinol dose. Denies any complains of bleeding or bruising symptoms. Denies any other relevant changes in her diet, medications, or lifestyle. Vit K goal of 1-2 servings/week. Will continue to monitor and follow up chronically.    PLAN Weekly dose was unchanged by 0% to  25 mg/week. Continue current dose of 5 mg every Tues, Thurs and Sat and 2.5 mg all other days. Recheck INR in 2 weeks  Patient Instructions  INR at goal. Continue current weekly dose of 5 mg Tues, Thurs, Sat and 2.5 mg all other days. Return in 2 weeks.   Patient advised to contact clinic or seek medical attention if signs/symptoms of bleeding or thromboembolism occur.  Patient verbalized understanding by repeating back information and was advised to contact me if further medication-related questions arise.   Follow-up Return in about 15 days (around 08/02/2020).  Alysia Penna, PharmD  15 minutes spent face-to-face with the patient during the encounter. 50% of time spent on education, including signs/sx bleeding and clotting, as well as food and drug interactions with warfarin. 50% of time was spent on fingerprick POC INR sample collection,processing, results determination, and documentation

## 2020-07-18 NOTE — Patient Instructions (Signed)
INR at goal. Continue current weekly dose of 5 mg Tues, Thurs, Sat and 2.5 mg all other days. Return in 2 weeks.

## 2020-07-25 ENCOUNTER — Ambulatory Visit: Payer: Medicare Other | Admitting: Hematology and Oncology

## 2020-07-26 ENCOUNTER — Encounter: Payer: Self-pay | Admitting: Hematology and Oncology

## 2020-07-26 ENCOUNTER — Inpatient Hospital Stay: Payer: Medicare Other

## 2020-07-26 ENCOUNTER — Inpatient Hospital Stay: Payer: Medicare Other | Attending: Hematology and Oncology | Admitting: Hematology and Oncology

## 2020-07-26 ENCOUNTER — Other Ambulatory Visit: Payer: Self-pay

## 2020-07-26 VITALS — BP 118/74 | HR 93 | Temp 98.2°F | Resp 18 | Ht 71.0 in | Wt 255.9 lb

## 2020-07-26 DIAGNOSIS — D649 Anemia, unspecified: Secondary | ICD-10-CM | POA: Diagnosis not present

## 2020-07-26 DIAGNOSIS — Z833 Family history of diabetes mellitus: Secondary | ICD-10-CM | POA: Diagnosis not present

## 2020-07-26 DIAGNOSIS — Z7952 Long term (current) use of systemic steroids: Secondary | ICD-10-CM | POA: Diagnosis not present

## 2020-07-26 DIAGNOSIS — I10 Essential (primary) hypertension: Secondary | ICD-10-CM | POA: Diagnosis not present

## 2020-07-26 DIAGNOSIS — Z7901 Long term (current) use of anticoagulants: Secondary | ICD-10-CM | POA: Insufficient documentation

## 2020-07-26 DIAGNOSIS — Z8249 Family history of ischemic heart disease and other diseases of the circulatory system: Secondary | ICD-10-CM | POA: Diagnosis not present

## 2020-07-26 DIAGNOSIS — I4891 Unspecified atrial fibrillation: Secondary | ICD-10-CM | POA: Insufficient documentation

## 2020-07-26 DIAGNOSIS — E119 Type 2 diabetes mellitus without complications: Secondary | ICD-10-CM | POA: Insufficient documentation

## 2020-07-26 DIAGNOSIS — D472 Monoclonal gammopathy: Secondary | ICD-10-CM | POA: Diagnosis not present

## 2020-07-26 DIAGNOSIS — E785 Hyperlipidemia, unspecified: Secondary | ICD-10-CM | POA: Diagnosis not present

## 2020-07-26 DIAGNOSIS — N183 Chronic kidney disease, stage 3 unspecified: Secondary | ICD-10-CM | POA: Insufficient documentation

## 2020-07-26 DIAGNOSIS — Z79899 Other long term (current) drug therapy: Secondary | ICD-10-CM | POA: Insufficient documentation

## 2020-07-26 DIAGNOSIS — Z8349 Family history of other endocrine, nutritional and metabolic diseases: Secondary | ICD-10-CM | POA: Diagnosis not present

## 2020-07-26 DIAGNOSIS — Z7984 Long term (current) use of oral hypoglycemic drugs: Secondary | ICD-10-CM | POA: Insufficient documentation

## 2020-07-26 DIAGNOSIS — J449 Chronic obstructive pulmonary disease, unspecified: Secondary | ICD-10-CM | POA: Diagnosis not present

## 2020-07-26 DIAGNOSIS — I252 Old myocardial infarction: Secondary | ICD-10-CM | POA: Diagnosis not present

## 2020-07-26 DIAGNOSIS — Z87891 Personal history of nicotine dependence: Secondary | ICD-10-CM | POA: Diagnosis not present

## 2020-07-26 LAB — CBC WITH DIFFERENTIAL/PLATELET
Abs Immature Granulocytes: 0.02 10*3/uL (ref 0.00–0.07)
Basophils Absolute: 0.1 10*3/uL (ref 0.0–0.1)
Basophils Relative: 2 %
Eosinophils Absolute: 0.5 10*3/uL (ref 0.0–0.5)
Eosinophils Relative: 8 %
HCT: 42.1 % (ref 39.0–52.0)
Hemoglobin: 13.5 g/dL (ref 13.0–17.0)
Immature Granulocytes: 0 %
Lymphocytes Relative: 32 %
Lymphs Abs: 2.1 10*3/uL (ref 0.7–4.0)
MCH: 27.6 pg (ref 26.0–34.0)
MCHC: 32.1 g/dL (ref 30.0–36.0)
MCV: 86.1 fL (ref 80.0–100.0)
Monocytes Absolute: 0.6 10*3/uL (ref 0.1–1.0)
Monocytes Relative: 9 %
Neutro Abs: 3.2 10*3/uL (ref 1.7–7.7)
Neutrophils Relative %: 49 %
Platelets: 235 10*3/uL (ref 150–400)
RBC: 4.89 MIL/uL (ref 4.22–5.81)
RDW: 15.7 % — ABNORMAL HIGH (ref 11.5–15.5)
WBC: 6.5 10*3/uL (ref 4.0–10.5)
nRBC: 0 % (ref 0.0–0.2)

## 2020-07-26 NOTE — Progress Notes (Signed)
[place] NOTE  Patient Care Team: Sonia Side., FNP as PCP - General (Family Medicine)  CHIEF COMPLAINTS/PURPOSE OF CONSULTATION:  Abnormal labs.  ASSESSMENT & PLAN:  No problem-specific Assessment & Plan notes found for this encounter.  Light chain MGUS  This is a very pleasant 75 year old male patient with past medical history significant for diabetes, hypertension, atrial fibrillation, chronic kidney disease who was referred to hematology for evaluation of abnormal labs I reviewed his labs which showed hemoglobin of 12.2, creatinine of 1.6-1.9, normal calcium, normal total protein, normal alkaline phosphatase.  No monoclonal spike observed.  Kappa lambda ratio however elevated at 3.5 with kappa light chain of 109. I recommended repeat labs but he hasnt done this yet. I will also arrange for a BMB outpatient and skeletal survey for further evaluation BMB will be scheduled.  Thank you for consulting [place] in the care of this patient.  Please not hesitate to contact us with any additional questions or concerns   No orders of the defined types were placed in this encounter.   HISTORY OF PRESENTING ILLNESS:  Gregory [name] 75 y.o. male is here because of some abnormal labs, specifically increased free light chains.  This is a very pleasant 75 year old male patient with past medical history significant for type 2 diabetes mellitus, hypertension, atrial fibrillation on anticoagulation who presented to hematology for worsening chronic kidney disease and elevated kappa light chain ratio.    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   He quit smoking in 2005.   Rest of the pertinent 10 point ROS as mentioned below and negative  REVIEW OF SYSTEMS:   Constitutional: Denies fevers, chills or abnormal night sweats Eyes: Denies blurriness of vision, double vision or watery eyes Ears, nose, mouth, throat, and face: Denies  mucositis or sore throat Respiratory: Denies cough, dyspnea or wheezes Cardiovascular: Denies palpitation, chest discomfort or lower extremity swelling Gastrointestinal:  Denies nausea, heartburn or change in bowel habits Skin: Denies abnormal skin rashes Lymphatics: Denies new lymphadenopathy or easy bruising Neurological:Denies numbness, tingling or new weaknesses Behavioral/Psych: Mood is stable, no new changes  All other systems were reviewed with the patient and are negative.  MEDICAL HISTORY:  Past Medical History:  Diagnosis Date  . CAD (coronary artery disease)   . COPD (chronic obstructive pulmonary disease) ([place])   . Diabetes mellitus   . Heart attack ([place])   . Hyperlipidemia   . Hypertension   . Neuromuscular disorder ([place])   . SOB (shortness of breath) on exertion     SURGICAL HISTORY: Past Surgical History:  Procedure Laterality Date  . BACK SURGERY    . CARDIAC CATHETERIZATION    . CORONARY STENT PLACEMENT    . KNEE ARTHROPLASTY    . LEFT HEART CATH AND CORONARY ANGIOGRAPHY N/A 03/19/2019   Procedure: LEFT HEART CATH AND CORONARY ANGIOGRAPHY;  Surgeon: Nigel Mormon, MD;  Location: [place] [facility];  Service: Cardiovascular;  Laterality: N/A;  . LEFT HEART CATHETERIZATION WITH CORONARY ANGIOGRAM N/A 03/03/2012   Procedure: LEFT HEART CATHETERIZATION WITH CORONARY ANGIOGRAM;  Surgeon: Laverda Page, MD;  Location: [facility] CATH LAB;  Service: Cardiovascular;  Laterality: N/A;  . PERCUTANEOUS CORONARY STENT INTERVENTION (PCI-S)  03/03/2012   Procedure: PERCUTANEOUS CORONARY STENT INTERVENTION (PCI-S);  Surgeon: Laverda Page, MD;  Location: [facility] CATH LAB;  Service: Cardiovascular;;    SOCIAL HISTORY: Social History   Socioeconomic History  . Marital status:  Married    Spouse name: Not on file  . Number of children: 3  . Years of education: Not on file  . Highest education level: Not on file  Occupational History  . Occupation: retired  Tobacco Use   . Smoking status: Former Smoker    Packs/day: 1.00    Years: 30.00    Pack years: 30.00    Types: Cigarettes    Quit date: 07/30/2003    Years since quitting: 17.0  . Smokeless tobacco: Never Used  Vaping Use  . Vaping Use: Never used  Substance and Sexual Activity  . Alcohol use: Yes    Alcohol/week: 0.0 standard drinks    Comment: wine rarely  . Drug use: No  . Sexual activity: Not on file  Other Topics Concern  . Not on file  Social History Narrative  . Not on file   Social Determinants of Health   Financial Resource Strain: Not on file  Food Insecurity: Not on file  Transportation Needs: Not on file  Physical Activity: Not on file  Stress: Not on file  Social Connections: Not on file  Intimate Partner Violence: Not on file    FAMILY HISTORY: Family History  Problem Relation Age of Onset  . Hypertension Father   . Hyperlipidemia Father   . Stroke Father   . Hypertension Paternal Grandfather   . Hyperlipidemia Paternal Grandfather   . Diabetes Other        maternal uncles  . Diabetes Sister   . Cancer Mother   . Colon cancer Neg Hx     ALLERGIES:  has No Known Allergies.  MEDICATIONS:  Current Outpatient Medications  Medication Sig Dispense Refill  . allopurinol (ZYLOPRIM) 300 MG tablet Take 300 mg by mouth daily.    Marland Kitchen amLODipine (NORVASC) 5 MG tablet TAKE 1 TABLET BY MOUTH  DAILY 90 tablet 3  . atorvastatin (LIPITOR) 20 MG tablet TAKE 1 TABLET BY MOUTH ONCE DAILY AT  6PM 90 tablet 1  . carvedilol (COREG) 25 MG tablet Take 25 mg by mouth 2 (two) times daily.    . colchicine 0.6 MG tablet Take 1 tablet by mouth as needed. Take 2 tabs initially then take 1 tab in 1 hr for gout attack    . furosemide (LASIX) 20 MG tablet TAKE 1 TABLET BY MOUTH ONCE DAILY IN THE MORNING 90 tablet 2  . glipiZIDE (GLUCOTROL) 5 MG tablet Take 2.5 mg by mouth daily before breakfast.    . hydrALAZINE (APRESOLINE) 100 MG tablet Take 100 mg by mouth 2 (two) times daily.     .  methylPREDNISolone (MEDROL DOSEPAK) 4 MG TBPK tablet Take by mouth.    . spironolactone (ALDACTONE) 25 MG tablet TAKE 1 TABLET BY MOUTH ONCE DAILY IN THE MORNING 90 tablet 0  . Tamsulosin HCl (FLOMAX) 0.4 MG CAPS Take 0.4 mg by mouth daily.    . valsartan (DIOVAN) 80 MG tablet Take 80 mg by mouth daily.    Marland Kitchen warfarin (COUMADIN) 5 MG tablet Take 1 tablet (5 mg total) by mouth daily. (Patient taking differently: Take 2.5-5 mg by mouth See admin instructions. 5 mg (5 mg x 1) every Tue, Thu, Sat; 2.5 mg (5 mg x 0.5) all other days. Refer to most recent anticoagulation note for most accurate dosing instructions) 90 tablet 3   Current Facility-Administered Medications  Medication Dose Route Frequency Provider Last Rate Last Admin  . 0.9 %  sodium chloride infusion  500 mL Intravenous Continuous Pyrtle,  [name], MD         PHYSICAL EXAMINATION: ECOG PERFORMANCE STATUS: 0 - Asymptomatic  There were no vitals filed for this visit. There were no vitals filed for this visit.  GENERAL:alert, no distress and comfortable, walks with a cane. SKIN: skin color, texture, turgor are normal, no rashes or significant lesions EYES: normal, conjunctiva are pink and non-injected, sclera clear OROPHARYNX:no exudate, no erythema and lips, buccal mucosa, and tongue normal  NECK: supple, thyroid normal size, non-tender, without nodularity LYMPH:  no palpable lymphadenopathy in the cervical, axillary or inguinal LUNGS: clear to auscultation and percussion with normal breathing effort HEART: regular rate & rhythm. S 3 heard. BLE edema. ABDOMEN:abdomen soft, non-tender and normal bowel sounds Musculoskeletal:no cyanosis of digits and no clubbing  PSYCH: alert & oriented x 3 with fluent speech NEURO: no focal motor/sensory deficits  LABORATORY DATA:  I have reviewed the data as listed Lab Results  Component Value Date   WBC 6.3 06/01/2020   HGB 12.7 (L) 06/01/2020   HCT 39.8 06/01/2020   MCV 85.0 06/01/2020    PLT 204 06/01/2020     Chemistry      Component Value Date/Time   NA 140 06/01/2020 1158   NA 141 09/13/2019 1310   K 3.8 06/01/2020 1158   CL 104 06/01/2020 1158   CO2 26 06/01/2020 1158   BUN 16 06/01/2020 1158   BUN 29 (H) 09/13/2019 1310   CREATININE 1.79 (H) 06/01/2020 1158      Component Value Date/Time   CALCIUM 9.8 06/01/2020 1158   ALKPHOS 126 06/01/2020 1158   AST 11 (L) 06/01/2020 1158   ALT 9 06/01/2020 1158   BILITOT 0.8 06/01/2020 1158      CMP with normal calcium, normal total protein and normal alk phos.    Creatinine of 1.68 Hb of 12 No M spike on SPEP Urine protein minimal. Monoclonal protein on urine not examined. K/L ratio of 3.52  RADIOGRAPHIC STUDIES: I have personally reviewed the radiological images as listed and agreed with the findings in the report. No results found.   All questions were answered. The patient knows to call the clinic with any problems, questions or concerns. I spent 30 minutes in the care of this patient including H and P, review of records, counseling, documentation and coordination of care.    Benay Pike, MD 07/26/2020 10:34 AM

## 2020-07-27 ENCOUNTER — Telehealth: Payer: Self-pay | Admitting: Hematology and Oncology

## 2020-07-27 LAB — KAPPA/LAMBDA LIGHT CHAINS
Kappa free light chain: 85.8 mg/L — ABNORMAL HIGH (ref 3.3–19.4)
Kappa, lambda light chain ratio: 2.84 — ABNORMAL HIGH (ref 0.26–1.65)
Lambda free light chains: 30.2 mg/L — ABNORMAL HIGH (ref 5.7–26.3)

## 2020-07-27 NOTE — Telephone Encounter (Signed)
Scheduled appointments per 2/2 los. Spoke to patient who is aware of appointments dates and times.  

## 2020-08-02 ENCOUNTER — Other Ambulatory Visit: Payer: Self-pay

## 2020-08-02 ENCOUNTER — Ambulatory Visit: Payer: Medicare Other | Admitting: Cardiology

## 2020-08-02 ENCOUNTER — Encounter: Payer: Self-pay | Admitting: Cardiology

## 2020-08-02 VITALS — BP 124/78 | HR 72 | Temp 98.3°F | Resp 16 | Ht 71.0 in | Wt 256.8 lb

## 2020-08-02 DIAGNOSIS — N184 Chronic kidney disease, stage 4 (severe): Secondary | ICD-10-CM

## 2020-08-02 DIAGNOSIS — I4821 Permanent atrial fibrillation: Secondary | ICD-10-CM

## 2020-08-02 DIAGNOSIS — I2510 Atherosclerotic heart disease of native coronary artery without angina pectoris: Secondary | ICD-10-CM

## 2020-08-02 DIAGNOSIS — I10 Essential (primary) hypertension: Secondary | ICD-10-CM

## 2020-08-02 DIAGNOSIS — I129 Hypertensive chronic kidney disease with stage 1 through stage 4 chronic kidney disease, or unspecified chronic kidney disease: Secondary | ICD-10-CM

## 2020-08-02 LAB — POCT INR: INR: 2.3 (ref 2.0–3.0)

## 2020-08-02 NOTE — Progress Notes (Signed)
Primary Physician/Referring:  Sonia Side., FNP  Patient ID: Gregory [name], male    DOB: June 10, 1946, 75 y.o.   MRN: [id number]  Chief Complaint  Patient presents with  . Atrial Fibrillation  . Follow-up  . Congestive Heart Failure   HPI:    Gregory [name]  is a 75 y.o. 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     Presently doing well no new complaints, no further leg edema, no PND or orthopnea.  Denies any chest pain or palpitations.  Dyspnea has remained stable.  States that his leg edema is very mild.  Overall feeling the best he has in quite a while.  No bleeding diathesis on Coumadin.  Past Medical History:  Diagnosis Date  . CAD (coronary artery disease)   . COPD (chronic obstructive pulmonary disease) ([place]   . Diabetes mellitus   . [place]   . Hyperlipidemia   . Hypertension   . [place]romuscular disorder ([place]   . SOB (shortness of breath) on exertion    Past Surgical History:  Procedure Laterality Date  . BACK SURGERY    . CARDIAC CATHETERIZATION    . CORONARY STENT PLACEMENT    . KNEE ARTHROPLASTY    . LEFT HEART CATH AND CORONARY ANGIOGRAPHY N/A 03/19/2019   Procedure: LEFT HEART CATH AND CORONARY ANGIOGRAPHY;  Surgeon: PNigel Mormon MD;  [facility];  Service: Cardiovascular;  Laterality: N/A;  . LEFT HEART CATHETERIZATION WITH CORONARY ANGIOGRAM N/A 03/03/2012   Procedure: LEFT HEART CATHETERIZATION WITH CORONARY ANGIOGRAM;  Surgeon: JLaverda Page MD;  [facility];  Service: Cardiovascular;  Laterality: N/A;  . PERCUTANEOUS CORONARY STENT INTERVENTION (PCI-S)  03/03/2012   Procedure: PERCUTANEOUS CORONARY STENT INTERVENTION (PCI-S);   Surgeon: JLaverda Page MD;  [facility];  Service: Cardiovascular;;   Social History   Tobacco Use  . Smoking status: Former Smoker    Packs/day: 1.00    Years: 30.00    Pack years: 30.00    Types: Cigarettes    Quit date: 07/30/2003    Years since quitting: 17.0  . Smokeless tobacco: Never Used  Substance Use Topics  . Alcohol use: Yes    Alcohol/week: 0.0 standard drinks    Comment: wine rarely    Marital Status: Married  ROS  Review of Systems  HENT: Negative for nosebleeds.   Cardiovascular: Positive for dyspnea on exertion (chronic and stable). Negative for chest pain and leg swelling.  Musculoskeletal: Positive for back pain (chronic and 3 back operations).  Gastrointestinal: Negative for melena.  Neurological: Positive for paresthesias (fingers).   Objective   Vitals with BMI 08/02/2020 07/26/2020 06/01/2020  Height 5' 11"  5' 11"  5' 11"   Weight 256 lbs 13 oz 255 lbs 14 oz 254 lbs 10 oz  BMI 35.83 [phone] Systolic [id number] Diastolic 78 74 68  Pulse 72 93 43    Blood pressure 124/78, pulse 72, temperature 98.3 F (36.8 C), temperature source Temporal, resp. rate 16, height 5' 11"  (1.803 m), weight 256 lb 12.8 oz (116.5 kg), SpO2 100 %. Body mass index is 35.82 kg/m.   Physical Exam Constitutional:      Comments: He is well-built and moderately obese in  no acute distress.  Cardiovascular:     Rate and Rhythm: Rhythm irregularly irregular.     Pulses: Normal pulses and intact distal pulses.     Heart sounds: No murmur heard. No gallop. No S3 or S4 sounds.      Comments: S1 is variable, S2 is normal. No JVD.  1 -plus bilateral pitting below-knee leg edema.   Pulmonary:     Effort: Pulmonary effort is normal.     Breath sounds: Normal breath sounds.  Abdominal:     General: Bowel sounds are normal.     Palpations: Abdomen is soft.     Comments: Obese.  Neurological:     General: No focal deficit present.     Mental Status: He is oriented  to person, place, and time.    Laboratory examination:   Recent Labs    09/09/19 1108 09/13/19 1310 06/01/20 1158  NA 139 141 140  K 4.2 3.7 3.8  CL 102 100 104  CO2 24 26 26   GLUCOSE 131* 92 92  BUN 14 29* 16  CREATININE 1.73* 1.97* 1.79*  CALCIUM 9.4 9.3 9.8  GFRNONAA 38* 33* 39*  GFRAA 44* 38*  --    CMP Latest Ref Rng & Units 06/01/2020 09/13/2019 09/09/2019  Glucose 70 - 99 mg/dL 92 92 131(H)  BUN 8 - 23 mg/dL 16 29(H) 14  Creatinine 0.61 - 1.24 mg/dL 1.79(H) 1.97(H) 1.73(H)  Sodium 135 - 145 mmol/L 140 141 139  Potassium 3.5 - 5.1 mmol/L 3.8 3.7 4.2  Chloride 98 - 111 mmol/L 104 100 102  CO2 22 - 32 mmol/L 26 26 24   Calcium 8.9 - 10.3 mg/dL 9.8 9.3 9.4  Total Protein 6.5 - 8.1 g/dL 8.2(H) - 7.6  Total Bilirubin 0.3 - 1.2 mg/dL 0.8 - 2.1(H)  Alkaline Phos 38 - 126 U/L 126 - 96  AST 15 - 41 U/L 11(L) - 17  ALT 0 - 44 U/L 9 - 19   CBC Latest Ref Rng & Units 07/26/2020 06/01/2020 09/09/2019  WBC 4.0 - 10.5 K/uL 6.5 6.3 7.4  Hemoglobin 13.0 - 17.0 g/dL 13.5 12.7(L) 12.8(L)  Hematocrit 39.0 - 52.0 % 42.1 39.8 40.9  Platelets 150 - 400 K/uL 235 204 166   Lipid Panel No results for input(s): CHOL, TRIG, LDLCALC, VLDL, HDL, CHOLHDL, LDLDIRECT in the last 8760 hours.  HEMOGLOBIN A1C Lab Results  Component Value Date   HGBA1C 6.0 (H) 03/18/2019   MPG 125.5 03/18/2019   BNP (last 3 results) Recent Labs    09/09/19 1108 09/13/19 1310  BNP 787.7* 452.6*    External Labs:   Lab Results  Component Value Date   INR 2.3 08/02/2020   INR 2.0 07/18/2020   INR 2.6 06/09/2020   External labs:   Hemoglobin 13.500 g/d 07/26/2020 Platelets 235.000 K/ 07/26/2020  Creatinine, Serum 1.790 mg/ 06/01/2020 Potassium 3.800 mm 06/01/2020 ALT (SGPT) 9.000 U/L 06/01/2020 T SH 4.350 08/23/2019  Lab 01/17/2020:  Serum glucose 109 mg, BUN 55, creatinine 2.6, EGFR 29.3 mL, sodium 140, potassium 4.8.  PSA 0.777 ng/ 08/23/2019  Medications and allergies  No Known Allergies   Current  Outpatient Medications  Medication Instructions  . allopurinol (ZYLOPRIM) 300 mg, Oral, Daily  . amLODipine (NORVASC) 5 MG tablet TAKE 1 TABLET BY MOUTH  DAILY  . atorvastatin (LIPITOR) 20 MG tablet TAKE 1 TABLET BY MOUTH ONCE DAILY AT  6PM  . carvedilol (COREG) 25 mg, Oral, 2 times daily  . colchicine 0.6 MG tablet  1 tablet, Oral, As needed, Take 2 tabs initially then take 1 tab in 1 hr for gout attack  . furosemide (LASIX) 20 MG tablet TAKE 1 TABLET BY MOUTH ONCE DAILY IN THE MORNING  . glipiZIDE (GLUCOTROL) 2.5 mg, Oral, Daily before breakfast  . hydrALAZINE (APRESOLINE) 100 mg, Oral, 2 times daily  . spironolactone (ALDACTONE) 25 MG tablet TAKE 1 TABLET BY MOUTH ONCE DAILY IN THE MORNING  . tamsulosin (FLOMAX) 0.4 mg, Oral, Daily  . valsartan (DIOVAN) 80 mg, Oral, Daily  . warfarin (COUMADIN) 5 mg, Oral, Daily    Radiology:   No results found.  Cardiac Studies:   Coronary Angiography 03/19/2019:  No change from 03/06/2012: LM: Normal LAD: Mid LAD focal 40% stenosis          Ostial diag-2 80% stenosis with TIMI III flow. Unchanged compared to 2013 angiography. Patent mid Diag 2 stent without ISR LCx: Patent mid LCX overlapping stents with 10-20% late lumen loss RCA: CTO Mid 100% occlusion with grade 3 left-to-right collaterals.   Echocardiogram 09/22/2019:  Left ventricle cavity is normal in size. Mild concentric hypertrophy of the left ventricle. Normal global wall motion. Mildly depressed LV systolic function with visual EF 40-45%. Could not evaluate diastolic function, patient probably in Afib. Left atrial cavity is severely dilated. LA vol index 67 ml/m2. Trileaflet aortic valve. Trace aortic stenosis. Trace aortic regurgitation. Mild aortic valve leaflet calcification.  Moderate (Grade II) mitral regurgitation.  Moderate tricuspid regurgitation. Moderate pulmonary hypertension.  Estimated pulmonary artery systolic pressure is 50 mmHg. Estimated RA pressure 10-15 mmHg.   Compared to previous study on 03/19/2019, there is significant increase in PASP from 30 mmHg.   EKG   EKG 08/02/2020: Atrial fibrillation with controlled ventricular response at rate of 75 beats minute, normal axis. Nonspecific T abnormality.  Single PVC.  Low-voltage complexes.  No significant change from EKG 11/04/2019   Assessment      ICD-10-CM   1. Atherosclerosis of native coronary artery of native heart without angina pectoris  I25.10   2. Permanent atrial fibrillation (HCC)  I48.21 EKG 12-Lead    POCT INR  3. Essential hypertension  I10   4. CKD stage 4 secondary to hypertension (HCC)  I12.9    N18.4    CHA2DS2-VASc Score is 5.  Yearly risk of stroke: 7.2% (A, HTN, CAD & CHF).  Score of 1=0.6; 2=2.2; 3=3.2; 4=4.8; 5=7.2; 6=9.8; 7=>9.8) -(CHF; HTN; vasc disease DM,  Male = 1; Age <65 =0; 65-74 = 1,  >75 =2; stroke/embolism= 2).     No orders of the defined types were placed in this encounter.  Medications Discontinued During This Encounter  Medication Reason  . 0.9 %  sodium chloride infusion Error  . methylPREDNISolone (MEDROL DOSEPAK) 4 MG TBPK tablet Completed Course   Recommendations:   Gregory [name]  is a 75 y.o.  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   Patient is in Remote Patient Monitoring and Principal Care Management as patient is high risk for hospitalization and complications from underlying medical conditions.   He is presently doing well, he has not had any acute decompensated heart failure, I reviewed his external labs, renal function is also improved.  Blood pressures well controlled.  He is tolerating  anticoagulation without bleeding diathesis.  No  changes in the medications were done by me, I have again extensively discussed with him regarding weight loss and continued physical activity and exercise. INR is therapeutic at 2.3 today.  We will continue to follow with over Coumadin clinic.  I will see him back in 6 months or sooner if problems.  I reviewed his external labs, renal function is improved.  CBC has remained stable.   Gregory Prows, MD, [facility] 08/02/2020, 1:32 PM Office: [phone]

## 2020-08-10 ENCOUNTER — Inpatient Hospital Stay (HOSPITAL_BASED_OUTPATIENT_CLINIC_OR_DEPARTMENT_OTHER): Payer: Medicare Other | Admitting: Hematology and Oncology

## 2020-08-10 ENCOUNTER — Other Ambulatory Visit: Payer: Self-pay

## 2020-08-10 ENCOUNTER — Other Ambulatory Visit: Payer: Self-pay | Admitting: Hematology and Oncology

## 2020-08-10 ENCOUNTER — Inpatient Hospital Stay: Payer: Medicare Other | Admitting: Hematology and Oncology

## 2020-08-10 ENCOUNTER — Ambulatory Visit: Payer: Medicare Other | Admitting: Hematology and Oncology

## 2020-08-10 VITALS — BP 122/68 | HR 67 | Temp 98.4°F | Resp 16

## 2020-08-10 DIAGNOSIS — D472 Monoclonal gammopathy: Secondary | ICD-10-CM | POA: Diagnosis not present

## 2020-08-10 DIAGNOSIS — Z7689 Persons encountering health services in other specified circumstances: Secondary | ICD-10-CM | POA: Diagnosis not present

## 2020-08-10 DIAGNOSIS — D539 Nutritional anemia, unspecified: Secondary | ICD-10-CM

## 2020-08-10 LAB — CBC WITH DIFFERENTIAL/PLATELET
Abs Immature Granulocytes: 0.01 10*3/uL (ref 0.00–0.07)
Basophils Absolute: 0.1 10*3/uL (ref 0.0–0.1)
Basophils Relative: 1 %
Eosinophils Absolute: 0.4 10*3/uL (ref 0.0–0.5)
Eosinophils Relative: 6 %
HCT: 38.3 % — ABNORMAL LOW (ref 39.0–52.0)
Hemoglobin: 12.2 g/dL — ABNORMAL LOW (ref 13.0–17.0)
Immature Granulocytes: 0 %
Lymphocytes Relative: 30 %
Lymphs Abs: 1.7 10*3/uL (ref 0.7–4.0)
MCH: 26.9 pg (ref 26.0–34.0)
MCHC: 31.9 g/dL (ref 30.0–36.0)
MCV: 84.5 fL (ref 80.0–100.0)
Monocytes Absolute: 0.6 10*3/uL (ref 0.1–1.0)
Monocytes Relative: 10 %
Neutro Abs: 2.9 10*3/uL (ref 1.7–7.7)
Neutrophils Relative %: 53 %
Platelets: 196 10*3/uL (ref 150–400)
RBC: 4.53 MIL/uL (ref 4.22–5.81)
RDW: 16.1 % — ABNORMAL HIGH (ref 11.5–15.5)
WBC: 5.6 10*3/uL (ref 4.0–10.5)
nRBC: 0 % (ref 0.0–0.2)

## 2020-08-10 MED ORDER — LIDOCAINE HCL 2 % IJ SOLN
INTRAMUSCULAR | Status: AC
  Filled 2020-08-10: qty 20

## 2020-08-10 NOTE — Progress Notes (Unsigned)
[facility] NOTE  Patient Care Team: Sonia Side., FNP as PCP - General (Family Medicine)  CHIEF COMPLAINTS/PURPOSE OF CONSULTATION:  Abnormal labs.  ASSESSMENT & PLAN:  No problem-specific Assessment & Plan notes found for this encounter.  Light chain MGUS  This is a very pleasant 75 year old male patient with past medical history significant for diabetes, hypertension, atrial fibrillation, chronic kidney disease who was referred to hematology for evaluation of abnormal labs I reviewed his labs which showed hemoglobin of 12.2, creatinine of 1.6-1.9, normal calcium, normal total protein, normal alkaline phosphatase.  No monoclonal spike observed.  Kappa lambda ratio however elevated at 3.5 with kappa light chain of 109. I recommended repeat labs but he hasnt done this yet. I will also arrange for a BMB outpatient and skeletal survey for further evaluation BMB will be scheduled.  Thank you for consulting [place] in the care of this patient.  Please not hesitate to contact us with any additional questions or concerns   Orders Placed This Encounter  Procedures   CBC with Differential/Platelet    Standing Status:   Standing    Number of Occurrences:   22    Standing Expiration Date:   08/10/2021    HISTORY OF PRESENTING ILLNESS:  Gregory [name] 75 y.o. male is here because of some abnormal labs, specifically increased free light chains.  This is a very pleasant 75 year old male patient with past medical history significant for type 2 diabetes mellitus, hypertension, atrial fibrillation on anticoagulation who presented to hematology for worsening chronic kidney disease and elevated kappa light chain ratio.    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   He quit smoking in 2005.   Rest of the pertinent 10 point ROS as mentioned below and negative  REVIEW OF SYSTEMS:   Constitutional: Denies fevers, chills or abnormal night  sweats Eyes: Denies blurriness of vision, double vision or watery eyes Ears, nose, mouth, throat, and face: Denies mucositis or sore throat Respiratory: Denies cough, dyspnea or wheezes Cardiovascular: Denies palpitation, chest discomfort or lower extremity swelling Gastrointestinal:  Denies nausea, heartburn or change in bowel habits Skin: Denies abnormal skin rashes Lymphatics: Denies new lymphadenopathy or easy bruising Neurological:Denies numbness, tingling or new weaknesses Behavioral/Psych: Mood is stable, no new changes  All other systems were reviewed with the patient and are negative.  MEDICAL HISTORY:  Past Medical History:  Diagnosis Date   CAD (coronary artery disease)    COPD (chronic obstructive pulmonary disease) ([place])    Diabetes mellitus    Heart attack ([place])    Hyperlipidemia    Hypertension    Neuromuscular disorder (HCC)    SOB (shortness of breath) on exertion     SURGICAL HISTORY: Past Surgical History:  Procedure Laterality Date   BACK SURGERY     CARDIAC CATHETERIZATION     CORONARY STENT PLACEMENT     KNEE ARTHROPLASTY     LEFT HEART CATH AND CORONARY ANGIOGRAPHY N/A 03/19/2019   Procedure: LEFT HEART CATH AND CORONARY ANGIOGRAPHY;  Surgeon: Nigel Mormon, MD;  Location: [facility];  Service: Cardiovascular;  Laterality: N/A;   LEFT HEART CATHETERIZATION WITH CORONARY ANGIOGRAM N/A 03/03/2012   Procedure: LEFT HEART CATHETERIZATION WITH CORONARY ANGIOGRAM;  Surgeon: Laverda Page, MD;  Location: [facility] CATH LAB;  Service: Cardiovascular;  Laterality: N/A;   PERCUTANEOUS CORONARY STENT INTERVENTION (PCI-S)  03/03/2012   Procedure: PERCUTANEOUS CORONARY STENT INTERVENTION (PCI-S);  Surgeon:  Laverda Page, MD;  Location: [facility] C[facility] LAB;  Service: Cardiovascular;;    SOCIAL HISTORY: Social History   Socioeconomic History   Marital status: Married    Spouse name: Not on file   Number of children: 3   Years of education:  Not on file   Highest education level: Not on file  Occupational History   Occupation: retired  Tobacco Use   Smoking status: Former Smoker    Packs/day: 1.00    Years: 30.00    Pack years: 30.00    Types: Cigarettes    Quit date: 07/30/2003    Years since quitting: 17.0   Smokeless tobacco: Never Used  Vaping Use   Vaping Use: Never used  Substance and Sexual Activity   Alcohol use: Yes    Alcohol/week: 0.0 standard drinks    Comment: wine rarely   Drug use: No   Sexual activity: Not on file  Other Topics Concern   Not on file  Social History Narrative   Not on file   Social Determinants of Health   Financial Resource Strain: Not on file  Food Insecurity: Not on file  Transportation Needs: Not on file  Physical Activity: Not on file  Stress: Not on file  Social Connections: Not on file  Intimate Partner Violence: Not on file    FAMILY HISTORY: Family History  Problem Relation Age of Onset   Hypertension Father    Hyperlipidemia Father    Stroke Father    Hypertension Paternal Grandfather    Hyperlipidemia Paternal Grandfather    Diabetes Other        maternal uncles   Diabetes Sister    Cancer Mother    Colon cancer Neg Hx     ALLERGIES:  has No Known Allergies.  MEDICATIONS:  Current Outpatient Medications  Medication Sig Dispense Refill   allopurinol (ZYLOPRIM) 300 MG tablet Take 300 mg by mouth daily.     amLODipine (NORVASC) 5 MG tablet TAKE 1 TABLET BY MOUTH  DAILY 90 tablet 3   atorvastatin (LIPITOR) 20 MG tablet TAKE 1 TABLET BY MOUTH ONCE DAILY AT  6PM 90 tablet 1   carvedilol (COREG) 25 MG tablet Take 25 mg by mouth 2 (two) times daily.     colchicine 0.6 MG tablet Take 1 tablet by mouth as needed. Take 2 tabs initially then take 1 tab in 1 hr for gout attack     furosemide (LASIX) 20 MG tablet TAKE 1 TABLET BY MOUTH ONCE DAILY IN THE MORNING 90 tablet 2   glipiZIDE (GLUCOTROL) 5 MG tablet Take 2.5 mg by mouth daily  before breakfast.     hydrALAZINE (APRESOLINE) 100 MG tablet Take 100 mg by mouth 2 (two) times daily.      spironolactone (ALDACTONE) 25 MG tablet TAKE 1 TABLET BY MOUTH ONCE DAILY IN THE MORNING 90 tablet 0   Tamsulosin HCl (FLOMAX) 0.4 MG CAPS Take 0.4 mg by mouth daily.     valsartan (DIOVAN) 80 MG tablet Take 80 mg by mouth daily.     warfarin (COUMADIN) 5 MG tablet Take 1 tablet (5 mg total) by mouth daily. (Patient taking differently: Take 2.5-5 mg by mouth See admin instructions. Tue, Thu, Sat taking 5 mg daily. All other days 2.5 mg daily.) 90 tablet 3   No current facility-administered medications for this visit.     PHYSICAL EXAMINATION: ECOG PERFORMANCE STATUS: 0 - Asymptomatic  There were no vitals filed for this visit. There were no  vitals filed for this visit.  GENERAL:alert, no distress and comfortable, walks with a cane. SKIN: skin color, texture, turgor are normal, no rashes or significant lesions EYES: normal, conjunctiva are pink and non-injected, sclera clear OROPHARYNX:no exudate, no erythema and lips, buccal mucosa, and tongue normal  NECK: supple, thyroid normal size, non-tender, without nodularity LYMPH:  no palpable lymphadenopathy in the cervical, axillary or inguinal LUNGS: clear to auscultation and percussion with normal breathing effort HEART: regular rate & rhythm. S 3 heard. BLE edema. ABDOMEN:abdomen soft, non-tender and normal bowel sounds Musculoskeletal:no cyanosis of digits and no clubbing  PSYCH: alert & oriented x 3 with fluent speech NEURO: no focal motor/sensory deficits  LABORATORY DATA:  I have reviewed the data as listed Lab Results  Component Value Date   WBC 5.6 08/10/2020   HGB 12.2 (L) 08/10/2020   HCT 38.3 (L) 08/10/2020   MCV 84.5 08/10/2020   PLT 196 08/10/2020     Chemistry      Component Value Date/Time   NA 140 06/01/2020 1158   NA 141 09/13/2019 1310   K 3.8 06/01/2020 1158   CL 104 06/01/2020 1158   CO2 26  06/01/2020 1158   BUN 16 06/01/2020 1158   BUN 29 (H) 09/13/2019 1310   CREATININE 1.79 (H) 06/01/2020 1158      Component Value Date/Time   CALCIUM 9.8 06/01/2020 1158   ALKPHOS 126 06/01/2020 1158   AST 11 (L) 06/01/2020 1158   ALT 9 06/01/2020 1158   BILITOT 0.8 06/01/2020 1158      CMP with normal calcium, normal total protein and normal alk phos.    Creatinine of 1.68 Hb of 12 No M spike on SPEP Urine protein minimal. Monoclonal protein on urine not examined. K/L ratio of 3.52  RADIOGRAPHIC STUDIES: I have personally reviewed the radiological images as listed and agreed with the findings in the report. No results found.   All questions were answered. The patient knows to call the clinic with any problems, questions or concerns. I spent 30 minutes in the care of this patient including H and P, review of records, counseling, documentation and coordination of care.    Benay Pike, MD 08/10/2020 10:09 AM

## 2020-08-10 NOTE — Progress Notes (Signed)
[place] NOTE  Patient Care Team: Sonia Side., FNP as PCP - General (Family Medicine)  CHIEF COMPLAINTS/PURPOSE OF CONSULTATION:  Abnormal labs.  ASSESSMENT & PLAN:  No problem-specific Assessment & Plan notes found for this encounter.  Light chain MGUS  This is a very pleasant 75 year old male patient with past medical history significant for diabetes, hypertension, atrial fibrillation, chronic kidney disease who was referred to hematology for evaluation of abnormal labs I reviewed his labs which showed hemoglobin of 12.2, creatinine of 1.6-1.9, normal calcium, normal total protein, normal alkaline phosphatase.  No monoclonal spike observed.  Kappa lambda ratio however elevated at 3.5 with kappa light chain of 109. I recommended repeat labs but he hasnt done this yet. I will also arrange for a BMB outpatient and skeletal survey for further evaluation BMB will be scheduled.  Thank you for consulting [place] in the care of this patient.  Please not hesitate to contact us with any additional questions or concerns   No orders of the defined types were placed in this encounter.   HISTORY OF PRESENTING ILLNESS:  Gregory [name] 75 y.o. male is here because of some abnormal labs, specifically increased free light chains.  This is a very pleasant 75 year old male patient with past medical history significant for type 2 diabetes mellitus, hypertension, atrial fibrillation on anticoagulation who presented to hematology for worsening chronic kidney disease and elevated kappa light chain ratio.    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   He quit smoking in 2005.   Rest of the pertinent 10 point ROS as mentioned below and negative  REVIEW OF SYSTEMS:   Constitutional: Denies fevers, chills or abnormal night sweats Eyes: Denies blurriness of vision, double vision or watery eyes Ears, nose, mouth, throat, and face: Denies  mucositis or sore throat Respiratory: Denies cough, dyspnea or wheezes Cardiovascular: Denies palpitation, chest discomfort or lower extremity swelling Gastrointestinal:  Denies nausea, heartburn or change in bowel habits Skin: Denies abnormal skin rashes Lymphatics: Denies new lymphadenopathy or easy bruising Neurological:Denies numbness, tingling or new weaknesses Behavioral/Psych: Mood is stable, no new changes  All other systems were reviewed with the patient and are negative.  MEDICAL HISTORY:  Past Medical History:  Diagnosis Date  . CAD (coronary artery disease)   . COPD (chronic obstructive pulmonary disease) ([place])   . Diabetes mellitus   . Heart attack ([place])   . Hyperlipidemia   . Hypertension   . Neuromuscular disorder ([place])   . SOB (shortness of breath) on exertion     SURGICAL HISTORY: Past Surgical History:  Procedure Laterality Date  . BACK SURGERY    . CARDIAC CATHETERIZATION    . CORONARY STENT PLACEMENT    . KNEE ARTHROPLASTY    . LEFT HEART CATH AND CORONARY ANGIOGRAPHY N/A 03/19/2019   Procedure: LEFT HEART CATH AND CORONARY ANGIOGRAPHY;  Surgeon: Nigel Mormon, MD;  Location: [place] [facility];  Service: Cardiovascular;  Laterality: N/A;  . LEFT HEART CATHETERIZATION WITH CORONARY ANGIOGRAM N/A 03/03/2012   Procedure: LEFT HEART CATHETERIZATION WITH CORONARY ANGIOGRAM;  Surgeon: Laverda Page, MD;  Location: [facility] CATH LAB;  Service: Cardiovascular;  Laterality: N/A;  . PERCUTANEOUS CORONARY STENT INTERVENTION (PCI-S)  03/03/2012   Procedure: PERCUTANEOUS CORONARY STENT INTERVENTION (PCI-S);  Surgeon: Laverda Page, MD;  Location: [facility] CATH LAB;  Service: Cardiovascular;;    SOCIAL HISTORY: Social History   Socioeconomic History  . Marital status:  Married    Spouse name: Not on file  . Number of children: 3  . Years of education: Not on file  . Highest education level: Not on file  Occupational History  . Occupation: retired  Tobacco Use   . Smoking status: Former Smoker    Packs/day: 1.00    Years: 30.00    Pack years: 30.00    Types: Cigarettes    Quit date: 07/30/2003    Years since quitting: 17.0  . Smokeless tobacco: Never Used  Vaping Use  . Vaping Use: Never used  Substance and Sexual Activity  . Alcohol use: Yes    Alcohol/week: 0.0 standard drinks    Comment: wine rarely  . Drug use: No  . Sexual activity: Not on file  Other Topics Concern  . Not on file  Social History Narrative  . Not on file   Social Determinants of Health   Financial Resource Strain: Not on file  Food Insecurity: Not on file  Transportation Needs: Not on file  Physical Activity: Not on file  Stress: Not on file  Social Connections: Not on file  Intimate Partner Violence: Not on file    FAMILY HISTORY: Family History  Problem Relation Age of Onset  . Hypertension Father   . Hyperlipidemia Father   . Stroke Father   . Hypertension Paternal Grandfather   . Hyperlipidemia Paternal Grandfather   . Diabetes Other        maternal uncles  . Diabetes Sister   . Cancer Mother   . Colon cancer Neg Hx     ALLERGIES:  has No Known Allergies.  MEDICATIONS:  Current Outpatient Medications  Medication Sig Dispense Refill  . allopurinol (ZYLOPRIM) 300 MG tablet Take 300 mg by mouth daily.    Marland Kitchen amLODipine (NORVASC) 5 MG tablet TAKE 1 TABLET BY MOUTH  DAILY 90 tablet 3  . atorvastatin (LIPITOR) 20 MG tablet TAKE 1 TABLET BY MOUTH ONCE DAILY AT  6PM 90 tablet 1  . carvedilol (COREG) 25 MG tablet Take 25 mg by mouth 2 (two) times daily.    . colchicine 0.6 MG tablet Take 1 tablet by mouth as needed. Take 2 tabs initially then take 1 tab in 1 hr for gout attack    . furosemide (LASIX) 20 MG tablet TAKE 1 TABLET BY MOUTH ONCE DAILY IN THE MORNING 90 tablet 2  . glipiZIDE (GLUCOTROL) 5 MG tablet Take 2.5 mg by mouth daily before breakfast.    . hydrALAZINE (APRESOLINE) 100 MG tablet Take 100 mg by mouth 2 (two) times daily.     Marland Kitchen  spironolactone (ALDACTONE) 25 MG tablet TAKE 1 TABLET BY MOUTH ONCE DAILY IN THE MORNING 90 tablet 0  . Tamsulosin HCl (FLOMAX) 0.4 MG CAPS Take 0.4 mg by mouth daily.    . valsartan (DIOVAN) 80 MG tablet Take 80 mg by mouth daily.    Marland Kitchen warfarin (COUMADIN) 5 MG tablet Take 1 tablet (5 mg total) by mouth daily. (Patient taking differently: Take 2.5-5 mg by mouth See admin instructions. Tue, Thu, Sat taking 5 mg daily. All other days 2.5 mg daily.) 90 tablet 3   No current facility-administered medications for this visit.     PHYSICAL EXAMINATION: ECOG PERFORMANCE STATUS: 0 - Asymptomatic  There were no vitals filed for this visit. There were no vitals filed for this visit.  GENERAL:alert, no distress and comfortable, walks with a cane. SKIN: skin color, texture, turgor are normal, no rashes or significant lesions  EYES: normal, conjunctiva are pink and non-injected, sclera clear OROPHARYNX:no exudate, no erythema and lips, buccal mucosa, and tongue normal  NECK: supple, thyroid normal size, non-tender, without nodularity LYMPH:  no palpable lymphadenopathy in the cervical, axillary or inguinal LUNGS: clear to auscultation and percussion with normal breathing effort HEART: regular rate & rhythm. S 3 heard. BLE edema. ABDOMEN:abdomen soft, non-tender and normal bowel sounds Musculoskeletal:no cyanosis of digits and no clubbing  PSYCH: alert & oriented x 3 with fluent speech NEURO: no focal motor/sensory deficits  LABORATORY DATA:  I have reviewed the data as listed Lab Results  Component Value Date   WBC 5.6 08/10/2020   HGB 12.2 (L) 08/10/2020   HCT 38.3 (L) 08/10/2020   MCV 84.5 08/10/2020   PLT 196 08/10/2020     Chemistry      Component Value Date/Time   NA 140 06/01/2020 1158   NA 141 09/13/2019 1310   K 3.8 06/01/2020 1158   CL 104 06/01/2020 1158   CO2 26 06/01/2020 1158   BUN 16 06/01/2020 1158   BUN 29 (H) 09/13/2019 1310   CREATININE 1.79 (H) 06/01/2020 1158       Component Value Date/Time   CALCIUM 9.8 06/01/2020 1158   ALKPHOS 126 06/01/2020 1158   AST 11 (L) 06/01/2020 1158   ALT 9 06/01/2020 1158   BILITOT 0.8 06/01/2020 1158      CMP with normal calcium, normal total protein and normal alk phos.    Creatinine of 1.68 Hb of 12 No M spike on SPEP Urine protein minimal. Monoclonal protein on urine not examined. K/L ratio of 3.52  RADIOGRAPHIC STUDIES: I have personally reviewed the radiological images as listed and agreed with the findings in the report. No results found.   All questions were answered. The patient knows to call the clinic with any problems, questions or concerns. I spent 30 minutes in the care of this patient including H and P, review of records, counseling, documentation and coordination of care.    Benay Pike, MD 08/10/2020 11:16 AM   This encounter was created in error - please disregard.

## 2020-08-10 NOTE — Progress Notes (Deleted)
INDICATION:  Brief examination was performed. ENT: adequate airway clearance Heart: regular rate and rhythm.No Murmurs Lungs: clear to auscultation, no wheezes, normal respiratory effort  Bone Marrow Biopsy and Aspiration Procedure Note   Informed consent was obtained and potential risks including bleeding, infection and pain were reviewed with the patient.  The patient's name, date of birth, identification, consent and allergies were verified prior to the start of procedure and time out was performed.  The left posterior iliac crest was chosen as the site of biopsy.  The skin was prepped with ChloraPrep.   10 cc of 2% lidocaine was used to provide local anaesthesia.   10 cc of bone marrow aspirate was obtained followed by 1.2 cm biopsy.  Pressure was applied to the biopsy site and bandage was placed over the biopsy site. Patient was made to lie on the back for 15 mins prior to discharge.  The procedure was tolerated well. COMPLICATIONS: None BLOOD LOSS: none The patient was discharged home in stable condition with a 1 week follow up to review results.  Patient was provided with post bone marrow biopsy instructions and instructed to call if there was any bleeding or worsening pain.  Specimens sent for flow cytometry, cytogenetics and additional studies.  Signed Praveena Iruku MD   

## 2020-08-10 NOTE — Patient Instructions (Signed)
Bone Marrow Aspiration and Bone Marrow Biopsy, Adult, Care After This sheet gives you information about how to care for yourself after your procedure. Your health care provider may also give you more specific instructions. If you have problems or questions, contact your health care provider. What can I expect after the procedure? After the procedure, it is common to have:  Mild pain and tenderness.  Swelling.  Bruising. Follow these instructions at home: Puncture site care  Follow instructions from your health care provider about how to take care of the puncture site. Make sure you: ? Wash your hands with soap and water before and after you change your bandage (dressing). If soap and water are not available, use hand sanitizer. ? Change your dressing as told by your health care provider.  Check your puncture site every day for signs of infection. Check for: ? More redness, swelling, or pain. ? Fluid or blood. ? Warmth. ? Pus or a bad smell.   Activity  Return to your normal activities as told by your health care provider. Ask your health care provider what activities are safe for you.  Do not lift anything that is heavier than 10 lb (4.5 kg), or the limit that you are told, until your health care provider says that it is safe.  Do not drive for 24 hours if you were given a sedative during your procedure. General instructions  Take over-the-counter and prescription medicines only as told by your health care provider.  Do not take baths, swim, or use a hot tub until your health care provider approves. Ask your health care provider if you may take showers. You may only be allowed to take sponge baths.  If directed, put ice on the affected area. To do this: ? Put ice in a plastic bag. ? Place a towel between your skin and the bag. ? Leave the ice on for 20 minutes, 2-3 times a day.  Keep all follow-up visits as told by your health care provider. This is important.   Contact a  health care provider if:  Your pain is not controlled with medicine.  You have a fever.  You have more redness, swelling, or pain around the puncture site.  You have fluid or blood coming from the puncture site.  Your puncture site feels warm to the touch.  You have pus or a bad smell coming from the puncture site. Summary  After the procedure, it is common to have mild pain, tenderness, swelling, and bruising.  Follow instructions from your health care provider about how to take care of the puncture site and what activities are safe for you.  Take over-the-counter and prescription medicines only as told by your health care provider.  Contact a health care provider if you have any signs of infection, such as fluid or blood coming from the puncture site. This information is not intended to replace advice given to you by your health care provider. Make sure you discuss any questions you have with your health care provider. Document Revised: 10/27/2018 Document Reviewed: 10/27/2018 Elsevier Patient Education  2021 [employer]  

## 2020-08-10 NOTE — Progress Notes (Signed)
Patient was observed for 30 minutes post-procedure. Vitals stable and bandage dry upon discharge of infusion clinic.

## 2020-08-10 NOTE — Progress Notes (Signed)
INDICATION:  Brief examination was performed. ENT: adequate airway clearance Heart: regular rate and rhythm.No Murmurs Lungs: clear to auscultation, no wheezes, normal respiratory effort  Bone Marrow Biopsy and Aspiration Procedure Note   Informed consent was obtained and potential risks including bleeding, infection and pain were reviewed with the patient.  The patient's name, date of birth, identification, consent and allergies were verified prior to the start of procedure and time out was performed.  The left posterior iliac crest was chosen as the site of biopsy.  The skin was prepped with ChloraPrep.   10 cc of 2% lidocaine was used to provide local anaesthesia.   10 cc of bone marrow aspirate was obtained followed by 1.2 cm biopsy.  Pressure was applied to the biopsy site and bandage was placed over the biopsy site. Patient was made to lie on the back for 15 mins prior to discharge.  The procedure was tolerated well. COMPLICATIONS: None BLOOD LOSS: none The patient was discharged home in stable condition with a 1 week follow up to review results.  Patient was provided with post bone marrow biopsy instructions and instructed to call if there was any bleeding or worsening pain.  Specimens sent for flow cytometry, cytogenetics and additional studies.  Signed Gershon Shorten MD   

## 2020-08-11 ENCOUNTER — Telehealth: Payer: Self-pay | Admitting: Hematology and Oncology

## 2020-08-11 LAB — KAPPA/LAMBDA LIGHT CHAINS
Kappa free light chain: 75.4 mg/L — ABNORMAL HIGH (ref 3.3–19.4)
Kappa, lambda light chain ratio: 2.56 — ABNORMAL HIGH (ref 0.26–1.65)
Lambda free light chains: 29.4 mg/L — ABNORMAL HIGH (ref 5.7–26.3)

## 2020-08-11 NOTE — Telephone Encounter (Signed)
No appointments scheduled. No notes in LOS

## 2020-08-17 ENCOUNTER — Encounter (HOSPITAL_COMMUNITY): Payer: Self-pay | Admitting: Hematology and Oncology

## 2020-08-21 ENCOUNTER — Telehealth: Payer: Self-pay

## 2020-08-21 ENCOUNTER — Encounter: Payer: Self-pay | Admitting: Hematology and Oncology

## 2020-08-21 ENCOUNTER — Other Ambulatory Visit: Payer: Self-pay

## 2020-08-21 ENCOUNTER — Inpatient Hospital Stay: Payer: Medicare Other | Admitting: Hematology and Oncology

## 2020-08-21 VITALS — BP 125/63 | HR 44 | Temp 96.5°F | Resp 19 | Ht 71.0 in | Wt 259.0 lb

## 2020-08-21 DIAGNOSIS — N1832 Chronic kidney disease, stage 3b: Secondary | ICD-10-CM

## 2020-08-21 DIAGNOSIS — D472 Monoclonal gammopathy: Secondary | ICD-10-CM | POA: Diagnosis not present

## 2020-08-21 NOTE — Telephone Encounter (Signed)
Spoke with pt's wife regarding pt's upcoming bone survey per MD Iruku. Gave pt's wife phone number [phone]) for pt to call and schedule bone survey. Pt's wife verbalizes understanding

## 2020-08-21 NOTE — Progress Notes (Signed)
[place] NOTE  Patient Care Team: Sonia Side., FNP as PCP - General (Family Medicine)  CHIEF COMPLAINTS/PURPOSE OF CONSULTATION:  Abnormal labs.  ASSESSMENT & PLAN:  No problem-specific Assessment & Plan notes found for this encounter.  Light chain MGUS  This is a very pleasant 75 year old male patient with past medical history significant for diabetes, hypertension, atrial fibrillation, chronic kidney disease who was referred to hematology for evaluation of abnormal labs I reviewed his labs which showed hemoglobin of 12.2, creatinine of 1.6-1.9, normal calcium, normal total protein, normal alkaline phosphatase.  No monoclonal spike observed.  Kappa lambda ratio however elevated at 3.5 with kappa light chain of 109. BMB with no evidence of MM. Bone survey hasnt been scheduled, gave him the number for centralized scheduling to contact and scheduled. Baseline UPEP ordered.  Given no evidence of MM so far, I recommended that he return in 6 months for FU. He is agreeable to these recommendations. He however knows that baseline bone survey has to be scheduled. Thank you for consulting [place] in the care of this patient.  Please not hesitate to contact us with any additional questions or concerns  2. CKD stage III Unrelated to the MGUS. He should continue FU with his nephrologist.  No orders of the defined types were placed in this encounter.   HISTORY OF PRESENTING ILLNESS:  Gregory [name] 75 y.o. male is here because of some abnormal labs, specifically increased free light chains.  This is a very pleasant 75 year old male patient with past medical history significant for type 2 diabetes mellitus, hypertension, atrial fibrillation on anticoagulation who presented to hematology for worsening chronic kidney disease and elevated kappa light chain ratio.    Interim History  Since last visit, he had his BMB No soreness reported at the site He feels well  overall. No interim B symptoms, new bone pains. He has a kidney doctor which he follows up with periodically.  SOCIAL HISTORY  He retired from [employer] company.   He quit smoking in 2005.   Rest of the pertinent 10 point ROS as mentioned below and negative  REVIEW OF SYSTEMS:   Constitutional: Denies fevers, chills or abnormal night sweats Eyes: Denies blurriness of vision, double vision or watery eyes Ears, nose, mouth, throat, and face: Denies mucositis or sore throat Respiratory: Denies cough, dyspnea or wheezes Cardiovascular: Denies palpitation, chest discomfort or lower extremity swelling Gastrointestinal:  Denies nausea, heartburn or change in bowel habits Skin: Denies abnormal skin rashes Lymphatics: Denies new lymphadenopathy or easy bruising Neurological:Denies numbness, tingling or new weaknesses Behavioral/Psych: Mood is stable, no new changes  All other systems were reviewed with the patient and are negative.  MEDICAL HISTORY:  Past Medical History:  Diagnosis Date  . CAD (coronary artery disease)   . COPD (chronic obstructive pulmonary disease) ([place])   . Diabetes mellitus   . Heart attack ([place])   . Hyperlipidemia   . Hypertension   . Neuromuscular disorder ([place])   . SOB (shortness of breath) on exertion     SURGICAL HISTORY: Past Surgical History:  Procedure Laterality Date  . BACK SURGERY    . CARDIAC CATHETERIZATION    . CORONARY STENT PLACEMENT    . KNEE ARTHROPLASTY    . LEFT HEART CATH AND CORONARY ANGIOGRAPHY N/A 03/19/2019   Procedure: LEFT HEART CATH AND CORONARY ANGIOGRAPHY;  Surgeon: Nigel Mormon, MD;  Location: [facility];  Service: Cardiovascular;  Laterality: N/A;  .  LEFT HEART CATHETERIZATION WITH CORONARY ANGIOGRAM N/A 03/03/2012   Procedure: LEFT HEART CATHETERIZATION WITH CORONARY ANGIOGRAM;  Surgeon: Laverda Page, MD;  Location[facility] C[facility];  Service: Cardiovascular;  Laterality: N/A;  . PERCUTANEOUS CORONARY  STENT INTERVENTION (PCI-S)  03/03/2012   Procedure: PERCUTANEOUS CORONARY STENT INTERVENTION (PCI-S);  Surgeon: Laverda Page, MD;  Location: [facility] CATH LAB;  Service: Cardiovascular;;    SOCIAL HISTORY: Social History   Socioeconomic History  . Marital status: Married    Spouse name: Not on file  . Number of children: 3  . Years of education: Not on file  . Highest education level: Not on file  Occupational History  . Occupation: retired  Tobacco Use  . Smoking status: Former Smoker    Packs/day: 1.00    Years: 30.00    Pack years: 30.00    Types: Cigarettes    Quit date: 07/30/2003    Years since quitting: 17.0  . Smokeless tobacco: Never Used  Vaping Use  . Vaping Use: Never used  Substance and Sexual Activity  . Alcohol use: Yes    Alcohol/week: 0.0 standard drinks    Comment: wine rarely  . Drug use: No  . Sexual activity: Not on file  Other Topics Concern  . Not on file  Social History Narrative  . Not on file   Social Determinants of Health   Financial Resource Strain: Not on file  Food Insecurity: Not on file  Transportation Needs: Not on file  Physical Activity: Not on file  Stress: Not on file  Social Connections: Not on file  Intimate Partner Violence: Not on file    FAMILY HISTORY: Family History  Problem Relation Age of Onset  . Hypertension Father   . Hyperlipidemia Father   . Stroke Father   . Hypertension Paternal Grandfather   . Hyperlipidemia Paternal Grandfather   . Diabetes Other        maternal uncles  . Diabetes Sister   . Cancer Mother   . Colon cancer Neg Hx     ALLERGIES:  has No Known Allergies.  MEDICATIONS:  Current Outpatient Medications  Medication Sig Dispense Refill  . allopurinol (ZYLOPRIM) 300 MG tablet Take 300 mg by mouth daily.    Marland Kitchen amLODipine (NORVASC) 5 MG tablet TAKE 1 TABLET BY MOUTH  DAILY 90 tablet 3  . atorvastatin (LIPITOR) 20 MG tablet TAKE 1 TABLET BY MOUTH ONCE DAILY AT  6PM 90 tablet 1  . carvedilol  (COREG) 25 MG tablet Take 25 mg by mouth 2 (two) times daily.    . colchicine 0.6 MG tablet Take 1 tablet by mouth as needed. Take 2 tabs initially then take 1 tab in 1 hr for gout attack    . furosemide (LASIX) 20 MG tablet TAKE 1 TABLET BY MOUTH ONCE DAILY IN THE MORNING 90 tablet 2  . glipiZIDE (GLUCOTROL) 5 MG tablet Take 2.5 mg by mouth daily before breakfast.    . hydrALAZINE (APRESOLINE) 100 MG tablet Take 100 mg by mouth 2 (two) times daily.     Marland Kitchen spironolactone (ALDACTONE) 25 MG tablet TAKE 1 TABLET BY MOUTH ONCE DAILY IN THE MORNING 90 tablet 0  . Tamsulosin HCl (FLOMAX) 0.4 MG CAPS Take 0.4 mg by mouth daily.    . valsartan (DIOVAN) 80 MG tablet Take 80 mg by mouth daily.    Marland Kitchen warfarin (COUMADIN) 5 MG tablet Take 1 tablet (5 mg total) by mouth daily. (Patient taking differently: Take 2.5-5 mg by  mouth See admin instructions. Tue, Thu, Sat taking 5 mg daily. All other days 2.5 mg daily.) 90 tablet 3   No current facility-administered medications for this visit.     PHYSICAL EXAMINATION: ECOG PERFORMANCE STATUS: 0 - Asymptomatic  Vitals:   08/21/20 0941 08/21/20 0951  BP: 125/63   Pulse: (!) 45 (!) 44  Resp: 19   Temp: (!) 96.5 F (35.8 C)   SpO2: 100%    Filed Weights   08/21/20 0941  Weight: 259 lb (117.5 kg)    GENERAL:alert, no distress and comfortable, walks with a cane. SKIN: skin color, texture, turgor are normal, no rashes or significant lesions EYES: normal, conjunctiva are pink and non-injected, sclera clear OROPHARYNX:no exudate, no erythema and lips, buccal mucosa, and tongue normal  NECK: supple, thyroid normal size, non-tender, without nodularity LYMPH:  no palpable lymphadenopathy in the cervical, axillary or inguinal LUNGS: clear to auscultation and percussion with normal breathing effort HEART: regular rate & rhythm. S 3 heard. BLE edema. ABDOMEN:abdomen soft, non-tender and normal bowel sounds Musculoskeletal:no cyanosis of digits and no clubbing   PSYCH: alert & oriented x 3 with fluent speech NEURO: no focal motor/sensory deficits  LABORATORY DATA:  I have reviewed the data as listed Lab Results  Component Value Date   WBC 5.6 08/10/2020   HGB 12.2 (L) 08/10/2020   HCT 38.3 (L) 08/10/2020   MCV 84.5 08/10/2020   PLT 196 08/10/2020     Chemistry      Component Value Date/Time   NA 140 06/01/2020 1158   NA 141 09/13/2019 1310   K 3.8 06/01/2020 1158   CL 104 06/01/2020 1158   CO2 26 06/01/2020 1158   BUN 16 06/01/2020 1158   BUN 29 (H) 09/13/2019 1310   CREATININE 1.79 (H) 06/01/2020 1158      Component Value Date/Time   CALCIUM 9.8 06/01/2020 1158   ALKPHOS 126 06/01/2020 1158   AST 11 (L) 06/01/2020 1158   ALT 9 06/01/2020 1158   BILITOT 0.8 06/01/2020 1158      CMP with normal calcium, normal total protein and normal alk phos.    Creatinine of 1.68 Hb of 12 No M spike on SPEP Urine protein minimal. Monoclonal protein on urine not examined. K/L ratio of 3.52  Surgical Pathology   Reason for Addendum #1: Molecular Genetic Test Results, FISH   Clinical History: Monoclonal gammopathy      DIAGNOSIS:   BONE MARROW, ASPIRATE, CLOT, CORE:  - Normocellular bone marrow with trilineage hematopoiesis and mild  plasmacytosis  - See comment   PERIPHERAL BLOOD:  - Normocytic anemia     COMMENT:   Plasma cells are mildly increased on aspirate smears (4% by manual  differential count), with scattered plasma cells showing atypical  morphology. CD138 immunohistochemistry on the clot and core biopsy  highlight plasma cells comprising 3-5% and 2% of overall marrow  cellularity, respectively. Light chain in situ hybridization reveals a  mixture of kappa and lambda expressing plasma cells, with focal plasma  cell groups showing kappa predominance. Together, low-level involvement  by a plasma cell neoplasm cannot be excluded. Correlation with clinical  findings, radiographic studies, other  laboratory data, and  cytogenetic/FISH results is recommended.   FISH no abnormalities.  RADIOGRAPHIC STUDIES: I have personally reviewed the radiological images as listed and agreed with the findings in the report. No results found.   All questions were answered. The patient knows to call the clinic with any problems, questions or  concerns. I spent 30 minutes in the care of this patient including H and P, review of records, counseling, documentation and coordination of care.    Benay Pike, MD 08/21/2020 10:00 AM

## 2020-08-22 ENCOUNTER — Other Ambulatory Visit: Payer: Self-pay | Admitting: Cardiology

## 2020-08-22 ENCOUNTER — Encounter (HOSPITAL_COMMUNITY): Payer: Self-pay | Admitting: Hematology and Oncology

## 2020-08-22 DIAGNOSIS — I10 Essential (primary) hypertension: Secondary | ICD-10-CM

## 2020-08-22 LAB — SURGICAL PATHOLOGY

## 2020-08-24 ENCOUNTER — Other Ambulatory Visit: Payer: Self-pay

## 2020-08-24 ENCOUNTER — Ambulatory Visit (HOSPITAL_COMMUNITY)
Admission: RE | Admit: 2020-08-24 | Discharge: 2020-08-24 | Disposition: A | Discharge status: Home / Self Care | Payer: Medicare Other | Source: Ambulatory Visit | Attending: Hematology and Oncology | Admitting: Hematology and Oncology

## 2020-08-24 ENCOUNTER — Other Ambulatory Visit: Payer: Self-pay | Admitting: *Deleted

## 2020-08-24 DIAGNOSIS — D472 Monoclonal gammopathy: Secondary | ICD-10-CM | POA: Insufficient documentation

## 2020-08-28 LAB — UPEP/TP, 24-HR URINE
Albumin, U: 24.2 %
Alpha 1, Urine: 5.6 %
Alpha 2, Urine: 13 %
Beta, Urine: 32.8 %
Gamma Globulin, Urine: 24.4 %
Total Protein, Urine-Ur/day: 176 mg/24 hr — ABNORMAL HIGH (ref 30–150)
Total Protein, Urine: 16 mg/dL
Total Volume: 1100

## 2020-08-29 ENCOUNTER — Other Ambulatory Visit: Payer: Self-pay

## 2020-08-29 ENCOUNTER — Ambulatory Visit: Payer: Medicare Other | Admitting: Pharmacist

## 2020-08-29 ENCOUNTER — Other Ambulatory Visit: Payer: Self-pay | Admitting: Pharmacist

## 2020-08-29 DIAGNOSIS — Z5181 Encounter for therapeutic drug level monitoring: Secondary | ICD-10-CM

## 2020-08-29 DIAGNOSIS — I480 Paroxysmal atrial fibrillation: Secondary | ICD-10-CM

## 2020-08-29 DIAGNOSIS — Z7901 Long term (current) use of anticoagulants: Secondary | ICD-10-CM

## 2020-08-29 LAB — POCT INR: INR: 1.4 — AB (ref 2.0–3.0)

## 2020-08-29 MED ORDER — ENOXAPARIN SODIUM 120 MG/0.8ML ~~LOC~~ SOLN: 120.0000 mg | Freq: Two times a day (BID) | SUBCUTANEOUS | 1 refills | Status: DC

## 2020-08-29 NOTE — Progress Notes (Signed)
Anticoagulation Management Gregory [name] is a 75 y.o. male who reports to the clinic for monitoring of warfarin treatment.    Indication: atrial fibrillation CHA2DS2 Vasc Score 4 (Age 58-74, HTN hx, DM Hx, Vascular disease hx) , HAS-BLED 1 (age>65)  Duration: indefinite Supervising physician: Adrian Prows  Anticoagulation Clinic Visit History:  Patient does not report signs/symptoms of bleeding or thromboembolism   Other recent changes: No changes in diet, lifestyle.  Recent allopurinol dose increased from 100 mg to 300 mg daily. Denies any recent gout flare up.   Anticoagulation Episode Summary    Current INR goal:  2.0-3.0  TTR:  77.5 % (1.3 y)  Next INR check:  09/01/2020  INR from last check:  1.4 (08/29/2020)  Weekly max warfarin dose:    Target end date:  Indefinite  INR check location:    Preferred lab:    Send INR reminders to:     Indications   Atrial fibrillation (HCC) [I48.91] Monitoring for long-term anticoagulant use [Z51.81 Z79.01]       Comments:         No Known Allergies  Current Outpatient Medications:  .  allopurinol (ZYLOPRIM) 300 MG tablet, Take 300 mg by mouth daily., Disp: , Rfl:  .  amLODipine (NORVASC) 5 MG tablet, TAKE 1 TABLET BY MOUTH  DAILY, Disp: 90 tablet, Rfl: 3 .  atorvastatin (LIPITOR) 20 MG tablet, TAKE 1 TABLET BY MOUTH ONCE DAILY AT  6PM, Disp: 90 tablet, Rfl: 1 .  carvedilol (COREG) 25 MG tablet, Take 25 mg by mouth 2 (two) times daily., Disp: , Rfl:  .  colchicine 0.6 MG tablet, Take 1 tablet by mouth as needed. Take 2 tabs initially then take 1 tab in 1 hr for gout attack, Disp: , Rfl:  .  furosemide (LASIX) 20 MG tablet, TAKE 1 TABLET BY MOUTH ONCE DAILY IN THE MORNING, Disp: 90 tablet, Rfl: 2 .  glipiZIDE (GLUCOTROL) 5 MG tablet, Take 2.5 mg by mouth daily before breakfast., Disp: , Rfl:  .  hydrALAZINE (APRESOLINE) 100 MG tablet, Take 100 mg by mouth 2 (two) times daily. , Disp: , Rfl:  .  spironolactone (ALDACTONE) 25 MG tablet,  TAKE 1 TABLET BY MOUTH ONCE DAILY IN THE MORNING, Disp: 90 tablet, Rfl: 0 .  Tamsulosin HCl (FLOMAX) 0.4 MG CAPS, Take 0.4 mg by mouth daily., Disp: , Rfl:  .  valsartan (DIOVAN) 80 MG tablet, Take 80 mg by mouth daily., Disp: , Rfl:  .  warfarin (COUMADIN) 5 MG tablet, Take 1 tablet (5 mg total) by mouth daily. (Patient taking differently: Take 2.5-5 mg by mouth See admin instructions. Tue, Thu, Sat taking 5 mg daily. All other days 2.5 mg daily.), Disp: 90 tablet, Rfl: 3 Past Medical History:  Diagnosis Date  . CAD (coronary artery disease)   . COPD (chronic obstructive pulmonary disease) ([place])   . Diabetes mellitus   . Heart attack ([place])   . Hyperlipidemia   . Hypertension   . Neuromuscular disorder ([place])   . SOB (shortness of breath) on exertion     ASSESSMENT  Recent Results: The most recent result is correlated with 25 mg per week:  Lab Results  Component Value Date   INR 1.4 (A) 08/29/2020   INR 2.3 08/02/2020   INR 2.0 07/18/2020    Anticoagulation Dosing: Description   INR below goal. Take lovenox 120 mg  every 12 hours. Take warfarin 10 mg today and then take 7.5 mg x 2. Return in  3 days.       INR today: Subtherapeutic. Pt denies any missed or skipped doses. Denies any other identifiable causes of subtherapeutic INR. Has been stable on the current dose previously.. Noted DDI b/w allopurinol and warfarin that may result in increased anticoagulation effect. INR continues to remains therapeutic despite increased allopurinol dose. Denies any complains of bleeding or bruising symptoms. Denies any other relevant changes in her diet, medications, or lifestyle. Vit K goal of 1-2 servings/week. Will initiate lovenox to reduce thromboembolic risk in setting of severe subtherapeutic INR reading today. Will boost over the next few days and continue close follow up.   PLAN Weekly dose was unchanged by 0% to 25 mg/week. Take 10 mg today and 7.5 mg x 2. Recheck INR in 3 days.    Patient Instructions  INR below goal. Take lovenox 120 mg  every 12 hours. Take warfarin 10 mg today and then take 7.5 mg x 2. Return in 3 days.   Patient advised to contact clinic or seek medical attention if signs/symptoms of bleeding or thromboembolism occur.  Patient verbalized understanding by repeating back information and was advised to contact me if further medication-related questions arise.   Follow-up Return in about 3 days (around 09/01/2020).  Alysia Penna, PharmD  15 minutes spent face-to-face with the patient during the encounter. 50% of time spent on education, including signs/sx bleeding and clotting, as well as food and drug interactions with warfarin. 50% of time was spent on fingerprick POC INR sample collection,processing, results determination, and documentation

## 2020-08-29 NOTE — Patient Instructions (Signed)
INR below goal. Take lovenox 120 mg  every 12 hours. Take warfarin 10 mg today and then take 7.5 mg x 2. Return in 3 days.

## 2020-09-01 ENCOUNTER — Ambulatory Visit: Payer: Medicare Other | Admitting: Pharmacist

## 2020-09-01 ENCOUNTER — Other Ambulatory Visit: Payer: Self-pay

## 2020-09-01 DIAGNOSIS — Z7901 Long term (current) use of anticoagulants: Secondary | ICD-10-CM

## 2020-09-01 DIAGNOSIS — I480 Paroxysmal atrial fibrillation: Secondary | ICD-10-CM

## 2020-09-01 DIAGNOSIS — Z5181 Encounter for therapeutic drug level monitoring: Secondary | ICD-10-CM

## 2020-09-01 LAB — POCT INR: INR: 2.9 (ref 2.0–3.0)

## 2020-09-01 NOTE — Patient Instructions (Signed)
INR at goal. Stop lovenox. Decrease weekly dose to 2.5 mg every Mon, Wed, Fri and 5 mg all other days. Recheck INR in 3 weeks.

## 2020-09-01 NOTE — Progress Notes (Signed)
Anticoagulation Management Gregory [name] is a 75 y.o. male who reports to the clinic for monitoring of warfarin treatment.    Indication: atrial fibrillation CHA2DS2 Vasc Score 4 (Age 4-74, HTN hx, DM Hx, Vascular disease hx) , HAS-BLED 1 (age>65)  Duration: indefinite Supervising physician: Adrian Prows  Anticoagulation Clinic Visit History:  Patient does not report signs/symptoms of bleeding or thromboembolism   Other recent changes: No changes in diet, lifestyle.  Recent allopurinol dose stable at  300 mg daily. Denies any recent gout flare up.   Anticoagulation Episode Summary    Current INR goal:  2.0-3.0  TTR:  77.4 % (1.3 y)  Next INR check:  09/22/2020  INR from last check:  2.9 (09/01/2020)  Weekly max warfarin dose:    Target end date:  Indefinite  INR check location:    Preferred lab:    Send INR reminders to:     Indications   Atrial fibrillation (HCC) [I48.91] Monitoring for long-term anticoagulant use [Z51.81 Z79.01]       Comments:         No Known Allergies  Current Outpatient Medications:  .  enoxaparin (LOVENOX) 120 MG/0.8ML injection, Inject 0.8 mLs (120 mg total) into the skin every 12 (twelve) hours., Disp: 4 mL, Rfl: 1 .  allopurinol (ZYLOPRIM) 300 MG tablet, Take 300 mg by mouth daily., Disp: , Rfl:  .  amLODipine (NORVASC) 5 MG tablet, TAKE 1 TABLET BY MOUTH  DAILY, Disp: 90 tablet, Rfl: 3 .  atorvastatin (LIPITOR) 20 MG tablet, TAKE 1 TABLET BY MOUTH ONCE DAILY AT  6PM, Disp: 90 tablet, Rfl: 1 .  carvedilol (COREG) 25 MG tablet, Take 25 mg by mouth 2 (two) times daily., Disp: , Rfl:  .  colchicine 0.6 MG tablet, Take 1 tablet by mouth as needed. Take 2 tabs initially then take 1 tab in 1 hr for gout attack, Disp: , Rfl:  .  furosemide (LASIX) 20 MG tablet, TAKE 1 TABLET BY MOUTH ONCE DAILY IN THE MORNING, Disp: 90 tablet, Rfl: 2 .  glipiZIDE (GLUCOTROL) 5 MG tablet, Take 2.5 mg by mouth daily before breakfast., Disp: , Rfl:  .  hydrALAZINE  (APRESOLINE) 100 MG tablet, Take 100 mg by mouth 2 (two) times daily. , Disp: , Rfl:  .  spironolactone (ALDACTONE) 25 MG tablet, TAKE 1 TABLET BY MOUTH ONCE DAILY IN THE MORNING, Disp: 90 tablet, Rfl: 0 .  Tamsulosin HCl (FLOMAX) 0.4 MG CAPS, Take 0.4 mg by mouth daily., Disp: , Rfl:  .  valsartan (DIOVAN) 80 MG tablet, Take 80 mg by mouth daily., Disp: , Rfl:  .  warfarin (COUMADIN) 5 MG tablet, Take 1 tablet (5 mg total) by mouth daily. (Patient taking differently: Take 2.5-5 mg by mouth See admin instructions. Tue, Thu, Sat taking 5 mg daily. All other days 2.5 mg daily.), Disp: 90 tablet, Rfl: 3 Past Medical History:  Diagnosis Date  . CAD (coronary artery disease)   . COPD (chronic obstructive pulmonary disease) ([place])   . Diabetes mellitus   . Heart attack ([place])   . Hyperlipidemia   . Hypertension   . Neuromuscular disorder ([place])   . SOB (shortness of breath) on exertion     ASSESSMENT  Recent Results: The most recent result is correlated with 25 mg per week:  Lab Results  Component Value Date   INR 2.9 09/01/2020   INR 1.4 (A) 08/29/2020   INR 2.3 08/02/2020    Anticoagulation Dosing: Description   INR at  goal. Stop lovenox. Decrease weekly dose to 2.5 mg every Mon, Wed, Fri and 5 mg all other days. Recheck INR in 3 weeks.      INR today: Therapeutic. INR improves following recent boost doses. Tolerating lovenox without any noted bleeding diathesis. Denies any complains of bleeding or bruising symptoms. Denies any other relevant changes in her diet, medications, or lifestyle. Vit K goal of 1-2 servings/week. Previously therapeutic on 25 mg/week. Will increase weekly dose and continue close monitoring to ensure INR remains therapeutic.   PLAN Weekly dose was unchanged by 10% to 27.5 mg/week. Increase weekly dose to 2.5 mg every Mon, Wed, Fri and 5 mg all other days. Recheck INR in 3 weeks.   Patient Instructions  INR at goal. Stop lovenox. Decrease weekly dose to 2.5 mg  every Mon, Wed, Fri and 5 mg all other days. Recheck INR in 3 weeks.   Patient advised to contact clinic or seek medical attention if signs/symptoms of bleeding or thromboembolism occur.  Patient verbalized understanding by repeating back information and was advised to contact me if further medication-related questions arise.   Follow-up Return in about 3 weeks (around 09/22/2020).  Alysia Penna, PharmD  15 minutes spent face-to-face with the patient during the encounter. 50% of time spent on education, including signs/sx bleeding and clotting, as well as food and drug interactions with warfarin. 50% of time was spent on fingerprick POC INR sample collection,processing, results determination, and documentation

## 2020-09-13 ENCOUNTER — Telehealth: Payer: Self-pay | Admitting: Pharmacist

## 2020-09-13 NOTE — Telephone Encounter (Signed)
CARE PLAN ENTRY  09/13/2020 Name: Gregory [name] MRN: [id number] DOB: [birth year]  Gregory [name] is enrolled in Remote Patient Monitoring/Principle Care Monitoring.  Date of Enrollment: 11/04/19 Supervising physician: Adrian Prows Indication: CHF  Remote Readings: Compliant  Next scheduled OV: 01/31/21  Pharmacist Clinical Goal(s):  Marland Kitchen Over the next 90 days, patient will demonstrate Improved medication adherence as evidenced by medication fill history . Over the next 90 days, patient will demonstrate improved understanding of prescribed medications and rationale for usage as evidenced by patient teach back . Over the next 90 days, patient will experience decrease in ED visits. ED visits in last 6 months = 0 . Over the next 90 days, patient will not experience hospital admission. Hospital Admissions in last 6 months = 0  Interventions: . Provider and Inter-disciplinary care team collaboration (see longitudinal plan of care) . Comprehensive medication review performed. . Discussed plans with patient for ongoing care management follow up and provided patient with direct contact information for care management team . Collaboration with provider re: medication management  Patient Self Care Activities:  . Self administers medications as prescribed . Attends all scheduled provider appointments . Performs ADL's independently . Performs IADL's independently  No Known Allergies Outpatient Encounter Medications as of 09/13/2020  Medication Sig  . enoxaparin (LOVENOX) 120 MG/0.8ML injection Inject 0.8 mLs (120 mg total) into the skin every 12 (twelve) hours.  Marland Kitchen allopurinol (ZYLOPRIM) 300 MG tablet Take 300 mg by mouth daily.  Marland Kitchen amLODipine (NORVASC) 5 MG tablet TAKE 1 TABLET BY MOUTH  DAILY  . atorvastatin (LIPITOR) 20 MG tablet TAKE 1 TABLET BY MOUTH ONCE DAILY AT  6PM  . carvedilol (COREG) 25 MG tablet Take 25 mg by mouth 2 (two) times daily.  . colchicine 0.6 MG tablet Take 1 tablet by mouth  as needed. Take 2 tabs initially then take 1 tab in 1 hr for gout attack  . furosemide (LASIX) 20 MG tablet TAKE 1 TABLET BY MOUTH ONCE DAILY IN THE MORNING  . glipiZIDE (GLUCOTROL) 5 MG tablet Take 2.5 mg by mouth daily before breakfast.  . hydrALAZINE (APRESOLINE) 100 MG tablet Take 100 mg by mouth 2 (two) times daily.   Marland Kitchen spironolactone (ALDACTONE) 25 MG tablet TAKE 1 TABLET BY MOUTH ONCE DAILY IN THE MORNING  . Tamsulosin HCl (FLOMAX) 0.4 MG CAPS Take 0.4 mg by mouth daily.  . valsartan (DIOVAN) 80 MG tablet Take 80 mg by mouth daily.  Marland Kitchen warfarin (COUMADIN) 5 MG tablet Take 1 tablet (5 mg total) by mouth daily. (Patient taking differently: Take 2.5-5 mg by mouth See admin instructions. Tue, Thu, Sat taking 5 mg daily. All other days 2.5 mg daily.)   No facility-administered encounter medications on file as of 09/13/2020.    Heart Failure   Type: Diastolic  Last ejection fraction: 40-45% NYHA Class: II (slight limitation of activity) AHA HF Stage: B (Heart disease present - no symptoms present)  Patient has failed these meds in past: HCTZ,  Patient is currently controlled on the following medications: amlodipine 5 mg,carvedilol 25 mg BID, lasix 20 mg, hydralazine 100 mg BID, spironolactone 25 mg, valsartan 80 mg.   We discussed diet and exercise extensively and weighing daily; if you gain more than 3 pounds in one day or 5 pounds in one week call your doctor  Plan  Continue current medications and control with diet and exercise ______________ Visit Information SDOH (Social Determinants of Health) assessments performed: Yes.  Mr. Fults was given information about  Principle Care Management/Remote Patient Monitoring services today including:  1. RPM/PCM service includes personalized support from designated clinical staff supervised by his physician, including individualized plan of care and coordination with other care providers 2. 24/7 contact phone numbers for assistance for urgent  and routine care needs. 3. Standard insurance, coinsurance, copays and deductibles apply for principle care management only during months in which we provide at least 30 minutes of these services. Most insurances cover these services at 100%, however patients may be responsible for any copay, coinsurance and/or deductible if applicable. This service may help you avoid the need for more expensive face-to-face services. 4. Only one practitioner may furnish and bill the service in a calendar month. 5. The patient may stop PCM/RPM services at any time (effective at the end of the month) by phone call to the office staff.  Patient agreed to services and verbal consent obtained.   [name], Pharm.D. [facility] [phone] Ext: 120

## 2020-09-22 ENCOUNTER — Ambulatory Visit: Payer: Medicare Other | Admitting: Pharmacist

## 2020-09-22 ENCOUNTER — Other Ambulatory Visit: Payer: Self-pay

## 2020-09-22 DIAGNOSIS — Z7901 Long term (current) use of anticoagulants: Secondary | ICD-10-CM

## 2020-09-22 DIAGNOSIS — Z5181 Encounter for therapeutic drug level monitoring: Secondary | ICD-10-CM

## 2020-09-22 DIAGNOSIS — I480 Paroxysmal atrial fibrillation: Secondary | ICD-10-CM

## 2020-09-22 LAB — POCT INR: INR: 1.9 — AB (ref 2.0–3.0)

## 2020-09-22 NOTE — Patient Instructions (Signed)
INR at goal. Retry weekly dose of 2.5 mg every Mon, Wed, Fri and 5 mg all other days. Recheck INR in 3 weeks.

## 2020-09-22 NOTE — Progress Notes (Signed)
Anticoagulation Management Gregory [name] is a 75 y.o. male who reports to the clinic for monitoring of warfarin treatment.    Indication: atrial fibrillation CHA2DS2 Vasc Score 4 (Age 46-74, HTN hx, DM Hx, Vascular disease hx) , HAS-BLED 1 (age>65)  Duration: indefinite Supervising physician: Adrian Prows  Anticoagulation Clinic Visit History:  Patient does not report signs/symptoms of bleeding or thromboembolism   Other recent changes: No changes in diet, lifestyle.  Recent allopurinol dose stable at 300 mg daily. Denies any recent gout flare up. Pt reports that he has been increasing his caloric intake and has been having associated weight gain.   Pt also reports that he misunderstood previous weekly dose change and had been taking 5 mg T/TH/Sat 2.5 mg all other days, instead of 2.5 mg MWF, 5 mg all other days.   Anticoagulation Episode Summary    Current INR goal:  2.0-3.0  TTR:  78.0 % (1.3 y)  Next INR check:  10/13/2020  INR from last check:  1.9 (09/22/2020)  Weekly max warfarin dose:    Target end date:  Indefinite  INR check location:    Preferred lab:    Send INR reminders to:     Indications   Atrial fibrillation (HCC) [I48.91] Monitoring for long-term anticoagulant use [Z51.81 Z79.01]       Comments:         No Known Allergies  Current Outpatient Medications:  .  enoxaparin (LOVENOX) 120 MG/0.8ML injection, Inject 0.8 mLs (120 mg total) into the skin every 12 (twelve) hours., Disp: 4 mL, Rfl: 1 .  allopurinol (ZYLOPRIM) 300 MG tablet, Take 300 mg by mouth daily., Disp: , Rfl:  .  amLODipine (NORVASC) 5 MG tablet, TAKE 1 TABLET BY MOUTH  DAILY, Disp: 90 tablet, Rfl: 3 .  atorvastatin (LIPITOR) 20 MG tablet, TAKE 1 TABLET BY MOUTH ONCE DAILY AT  6PM, Disp: 90 tablet, Rfl: 1 .  carvedilol (COREG) 25 MG tablet, Take 25 mg by mouth 2 (two) times daily., Disp: , Rfl:  .  colchicine 0.6 MG tablet, Take 1 tablet by mouth as needed. Take 2 tabs initially then take 1 tab in  1 hr for gout attack, Disp: , Rfl:  .  furosemide (LASIX) 20 MG tablet, TAKE 1 TABLET BY MOUTH ONCE DAILY IN THE MORNING, Disp: 90 tablet, Rfl: 2 .  glipiZIDE (GLUCOTROL) 5 MG tablet, Take 2.5 mg by mouth daily before breakfast., Disp: , Rfl:  .  hydrALAZINE (APRESOLINE) 100 MG tablet, Take 100 mg by mouth 2 (two) times daily. , Disp: , Rfl:  .  spironolactone (ALDACTONE) 25 MG tablet, TAKE 1 TABLET BY MOUTH ONCE DAILY IN THE MORNING, Disp: 90 tablet, Rfl: 0 .  Tamsulosin HCl (FLOMAX) 0.4 MG CAPS, Take 0.4 mg by mouth daily., Disp: , Rfl:  .  valsartan (DIOVAN) 80 MG tablet, Take 80 mg by mouth daily., Disp: , Rfl:  .  warfarin (COUMADIN) 5 MG tablet, Take 1 tablet (5 mg total) by mouth daily. (Patient taking differently: Take 2.5-5 mg by mouth See admin instructions. Tue, Thu, Sat taking 5 mg daily. All other days 2.5 mg daily.), Disp: 90 tablet, Rfl: 3 Past Medical History:  Diagnosis Date  . CAD (coronary artery disease)   . COPD (chronic obstructive pulmonary disease) ([place])   . Diabetes mellitus   . Heart attack ([place])   . Hyperlipidemia   . Hypertension   . Neuromuscular disorder ([place])   . SOB (shortness of breath) on exertion  ASSESSMENT  Recent Results: The most recent result is correlated with 27.5 mg per week:  Lab Results  Component Value Date   INR 1.9 (A) 09/22/2020   INR 2.9 09/01/2020   INR 1.4 (A) 08/29/2020    Anticoagulation Dosing: Description   INR at goal. Retry weekly dose of 2.5 mg every Mon, Wed, Fri and 5 mg all other days. Recheck INR in 3 weeks.      INR today: Subtherapeutic. Following recent weekly dose increase. Possible weekly dose deviation could also be contributing to the subtherapeutic INR. INR slightly below therapeutic goal. Will have pt retry previous dose to hopefully be closer to ~2.5 INR range. Pt denies any missed or skipped dose. Pt denies any relevant changes in diet, medications, or lifestyle. Pt denies any abnormal bleeding or  bruising symptoms. Will continue close monitoring.   PLAN Weekly dose was unchanged by 0% to 27.5 mg/week. Retry previous weekly dose of 2.5 mg every Mon, Wed, Fri and 5 mg all other days. Recheck INR in 3 weeks.   Patient Instructions  INR at goal. Retry weekly dose of 2.5 mg every Mon, Wed, Fri and 5 mg all other days. Recheck INR in 3 weeks.   Patient advised to contact clinic or seek medical attention if signs/symptoms of bleeding or thromboembolism occur.  Patient verbalized understanding by repeating back information and was advised to contact me if further medication-related questions arise.   Follow-up Return in about 3 weeks (around 10/13/2020).  Alysia Penna, PharmD  15 minutes spent face-to-face with the patient during the encounter. 50% of time spent on education, including signs/sx bleeding and clotting, as well as food and drug interactions with warfarin. 50% of time was spent on fingerprick POC INR sample collection,processing, results determination, and documentation

## 2020-10-13 ENCOUNTER — Ambulatory Visit: Payer: Medicare Other | Admitting: Pharmacist

## 2020-10-13 ENCOUNTER — Other Ambulatory Visit: Payer: Self-pay

## 2020-10-13 DIAGNOSIS — I480 Paroxysmal atrial fibrillation: Secondary | ICD-10-CM

## 2020-10-13 DIAGNOSIS — Z7901 Long term (current) use of anticoagulants: Secondary | ICD-10-CM

## 2020-10-13 DIAGNOSIS — Z5181 Encounter for therapeutic drug level monitoring: Secondary | ICD-10-CM

## 2020-10-13 LAB — POCT INR: INR: 1.8 — AB (ref 2.0–3.0)

## 2020-10-13 NOTE — Progress Notes (Signed)
Anticoagulation Management Gregory [name] is a 75 y.o. male who reports to the clinic for monitoring of warfarin treatment.    Indication: atrial fibrillation CHA2DS2 Vasc Score 4 (Age 54-74, HTN hx, DM Hx, Vascular disease hx) , HAS-BLED 1 (age>65)  Duration: indefinite Supervising physician: Adrian Prows  Anticoagulation Clinic Visit History:  Patient does not report signs/symptoms of bleeding or thromboembolism   Other recent changes: No changes in diet, lifestyle.  Recent allopurinol dose stable at 300 mg daily. Denies any recent gout flare up. Pt reports that he has been increasing his caloric intake and has been having associated weight gain.   Pt reports that he has been having more cabbage intake over the past week.    Anticoagulation Episode Summary    Current INR goal:  2.0-3.0  TTR:  74.7 % (1.4 y)  Next INR check:  10/27/2020  INR from last check:  1.8 (10/13/2020)  Weekly max warfarin dose:    Target end date:  Indefinite  INR check location:    Preferred lab:    Send INR reminders to:     Indications   Atrial fibrillation (HCC) [I48.91] Monitoring for long-term anticoagulant use [Z51.81 Z79.01]       Comments:         No Known Allergies  Current Outpatient Medications:  .  enoxaparin (LOVENOX) 120 MG/0.8ML injection, Inject 0.8 mLs (120 mg total) into the skin every 12 (twelve) hours., Disp: 4 mL, Rfl: 1 .  allopurinol (ZYLOPRIM) 300 MG tablet, Take 300 mg by mouth daily., Disp: , Rfl:  .  amLODipine (NORVASC) 5 MG tablet, TAKE 1 TABLET BY MOUTH  DAILY, Disp: 90 tablet, Rfl: 3 .  atorvastatin (LIPITOR) 20 MG tablet, TAKE 1 TABLET BY MOUTH ONCE DAILY AT  6PM, Disp: 90 tablet, Rfl: 1 .  carvedilol (COREG) 25 MG tablet, Take 25 mg by mouth 2 (two) times daily., Disp: , Rfl:  .  colchicine 0.6 MG tablet, Take 1 tablet by mouth as needed. Take 2 tabs initially then take 1 tab in 1 hr for gout attack, Disp: , Rfl:  .  furosemide (LASIX) 20 MG tablet, TAKE 1 TABLET BY  MOUTH ONCE DAILY IN THE MORNING, Disp: 90 tablet, Rfl: 2 .  glipiZIDE (GLUCOTROL) 5 MG tablet, Take 2.5 mg by mouth daily before breakfast., Disp: , Rfl:  .  hydrALAZINE (APRESOLINE) 100 MG tablet, Take 100 mg by mouth 2 (two) times daily. , Disp: , Rfl:  .  spironolactone (ALDACTONE) 25 MG tablet, TAKE 1 TABLET BY MOUTH ONCE DAILY IN THE MORNING, Disp: 90 tablet, Rfl: 0 .  Tamsulosin HCl (FLOMAX) 0.4 MG CAPS, Take 0.4 mg by mouth daily., Disp: , Rfl:  .  valsartan (DIOVAN) 80 MG tablet, Take 80 mg by mouth daily., Disp: , Rfl:  .  warfarin (COUMADIN) 5 MG tablet, Take 1 tablet (5 mg total) by mouth daily. (Patient taking differently: Take 2.5-5 mg by mouth See admin instructions. Tue, Thu, Sat taking 5 mg daily. All other days 2.5 mg daily.), Disp: 90 tablet, Rfl: 3 Past Medical History:  Diagnosis Date  . CAD (coronary artery disease)   . COPD (chronic obstructive pulmonary disease) ([place])   . Diabetes mellitus   . Heart attack ([place])   . Hyperlipidemia   . Hypertension   . Neuromuscular disorder ([place])   . SOB (shortness of breath) on exertion     ASSESSMENT  Recent Results: The most recent result is correlated with 27.5 mg per week:  Lab Results  Component Value Date   INR 1.8 (A) 10/13/2020   INR 1.9 (A) 09/22/2020   INR 2.9 09/01/2020    Anticoagulation Dosing: Description   INR below goal. Take 5 mg today and then continue taking current weekly dose of 2.5 mg every Mon, Wed, Fri and 5 mg all other days. Recheck INR in 2 weeks.      INR today: Subtherapeutic. Despite recently weekly dose increase. Pt reports that he has been having increased Vit K than above baseline. Planing on returning to previous baseline. Pt denies any missed or skipped dose. Pt denies any relevant changes in diet, medications, or lifestyle. Pt denies any abnormal bleeding or bruising symptoms. Will boost today and continue close monitoring. Will increase weekly dose if INR continues to be subtherapeutic.    PLAN Weekly dose was unchanged by 0% to 27.5 mg/week. Retry previous weekly dose of 2.5 mg every Mon, Wed, Fri and 5 mg all other days. Recheck INR in 2 weeks.   Patient Instructions  INR below goal. Take 5 mg today and then continue taking current weekly dose of 2.5 mg every Mon, Wed, Fri and 5 mg all other days. Recheck INR in 2 weeks.   Patient advised to contact clinic or seek medical attention if signs/symptoms of bleeding or thromboembolism occur.  Patient verbalized understanding by repeating back information and was advised to contact me if further medication-related questions arise.   Follow-up Return in about 2 weeks (around 10/27/2020).  Alysia Penna, PharmD  15 minutes spent face-to-face with the patient during the encounter. 50% of time spent on education, including signs/sx bleeding and clotting, as well as food and drug interactions with warfarin. 50% of time was spent on fingerprick POC INR sample collection,processing, results determination, and documentation

## 2020-10-13 NOTE — Patient Instructions (Signed)
INR below goal. Take 5 mg today and then continue taking current weekly dose of 2.5 mg every Mon, Wed, Fri and 5 mg all other days. Recheck INR in 2 weeks.

## 2020-10-24 ENCOUNTER — Other Ambulatory Visit: Payer: Self-pay | Admitting: Cardiology

## 2020-10-24 DIAGNOSIS — Z7901 Long term (current) use of anticoagulants: Secondary | ICD-10-CM

## 2020-10-24 DIAGNOSIS — Z5181 Encounter for therapeutic drug level monitoring: Secondary | ICD-10-CM

## 2020-10-24 DIAGNOSIS — I4821 Permanent atrial fibrillation: Secondary | ICD-10-CM

## 2020-10-27 ENCOUNTER — Ambulatory Visit: Payer: Medicare Other | Admitting: Pharmacist

## 2020-10-27 ENCOUNTER — Other Ambulatory Visit: Payer: Self-pay

## 2020-10-27 DIAGNOSIS — Z7901 Long term (current) use of anticoagulants: Secondary | ICD-10-CM

## 2020-10-27 DIAGNOSIS — I480 Paroxysmal atrial fibrillation: Secondary | ICD-10-CM

## 2020-10-27 DIAGNOSIS — Z5181 Encounter for therapeutic drug level monitoring: Secondary | ICD-10-CM

## 2020-10-27 LAB — POCT INR: INR: 1.6 — AB (ref 2.0–3.0)

## 2020-10-27 NOTE — Patient Instructions (Signed)
INR below goal. Take 7.5 mg today and then increase weekly dose to 2.5 mg every Wed and 5 mg all other days. Recheck INR in 2 weeks.

## 2020-10-27 NOTE — Progress Notes (Signed)
Anticoagulation Management Gregory [name] is a 75 y.o. male who reports to the clinic for monitoring of warfarin treatment.    Indication: atrial fibrillation CHA2DS2 Vasc Score 4 (Age 58-74, HTN hx, DM Hx, Vascular disease hx) , HAS-BLED 1 (age>65)  Duration: indefinite Supervising physician: Adrian Prows  Anticoagulation Clinic Visit History:  Patient does not report signs/symptoms of bleeding or thromboembolism   Other recent changes: No changes in diet, lifestyle.  Allopurinol dose stable at 300 mg daily. Denies any recent gout flare up.    Anticoagulation Episode Summary    Current INR goal:  2.0-3.0  TTR:  72.7 % (1.4 y)  Next INR check:  11/10/2020  INR from last check:  1.6 (10/27/2020)  Weekly max warfarin dose:    Target end date:  Indefinite  INR check location:    Preferred lab:    Send INR reminders to:     Indications   Atrial fibrillation (HCC) [I48.91] Monitoring for long-term anticoagulant use [Z51.81 Z79.01]       Comments:         No Known Allergies  Current Outpatient Medications:  .  enoxaparin (LOVENOX) 120 MG/0.8ML injection, Inject 0.8 mLs (120 mg total) into the skin every 12 (twelve) hours., Disp: 4 mL, Rfl: 1 .  allopurinol (ZYLOPRIM) 300 MG tablet, Take 300 mg by mouth daily., Disp: , Rfl:  .  amLODipine (NORVASC) 5 MG tablet, TAKE 1 TABLET BY MOUTH  DAILY, Disp: 90 tablet, Rfl: 3 .  atorvastatin (LIPITOR) 20 MG tablet, TAKE 1 TABLET BY MOUTH ONCE DAILY AT  6PM, Disp: 90 tablet, Rfl: 1 .  carvedilol (COREG) 25 MG tablet, Take 25 mg by mouth 2 (two) times daily., Disp: , Rfl:  .  colchicine 0.6 MG tablet, Take 1 tablet by mouth as needed. Take 2 tabs initially then take 1 tab in 1 hr for gout attack, Disp: , Rfl:  .  furosemide (LASIX) 20 MG tablet, TAKE 1 TABLET BY MOUTH ONCE DAILY IN THE MORNING, Disp: 90 tablet, Rfl: 2 .  glipiZIDE (GLUCOTROL) 5 MG tablet, Take 2.5 mg by mouth daily before breakfast., Disp: , Rfl:  .  hydrALAZINE (APRESOLINE) 100  MG tablet, Take 100 mg by mouth 2 (two) times daily. , Disp: , Rfl:  .  metFORMIN (GLUCOPHAGE) 500 MG tablet, Take 1 tablet by mouth daily., Disp: , Rfl:  .  ONETOUCH VERIO test strip, 1 each daily., Disp: , Rfl:  .  spironolactone (ALDACTONE) 25 MG tablet, TAKE 1 TABLET BY MOUTH ONCE DAILY IN THE MORNING, Disp: 90 tablet, Rfl: 0 .  Tamsulosin HCl (FLOMAX) 0.4 MG CAPS, Take 0.4 mg by mouth daily., Disp: , Rfl:  .  valsartan (DIOVAN) 80 MG tablet, Take 80 mg by mouth daily., Disp: , Rfl:  .  warfarin (COUMADIN) 5 MG tablet, Take 1 tablet (5 mg total) by mouth daily. Or as directed by the coumadin clinic, Disp: 90 tablet, Rfl: 0 Past Medical History:  Diagnosis Date  . CAD (coronary artery disease)   . COPD (chronic obstructive pulmonary disease) ([place])   . Diabetes mellitus   . Heart attack ([place])   . Hyperlipidemia   . Hypertension   . Neuromuscular disorder ([place])   . SOB (shortness of breath) on exertion     ASSESSMENT  Recent Results: The most recent result is correlated with 27.5 mg per week:  Lab Results  Component Value Date   INR 1.6 (A) 10/27/2020   INR 1.8 (A) 10/13/2020  INR 1.9 (A) 09/22/2020    Anticoagulation Dosing: Description   INR below goal. Take 7.5 mg today and then increase weekly dose to 2.5 mg every Wed and 5 mg all other days. Recheck INR in 2 weeks.      INR today: Subtherapeutic. INR continues to trend down despite recent weekly dose increase. Pt denies any missed or skipped dose. Pt denies any relevant changes in diet, medications, or lifestyle. Pt denies any abnormal bleeding or bruising symptoms. Will increase weekly dose in setting of persistent subtherapeutic INR readings  PLAN Weekly dose was increased by 18.2% to 32.5 mg/week. Take 7.5 mg today and then increase weekly dose to 2.5 mg every Wed and 5 mg all other days. Recheck INR in 2 weeks.   Patient Instructions  INR below goal. Take 7.5 mg today and then increase weekly dose to 2.5 mg every  Wed and 5 mg all other days. Recheck INR in 2 weeks.   Patient advised to contact clinic or seek medical attention if signs/symptoms of bleeding or thromboembolism occur.  Patient verbalized understanding by repeating back information and was advised to contact me if further medication-related questions arise.   Follow-up Return in about 2 weeks (around 11/10/2020).  Alysia Penna, PharmD  15 minutes spent face-to-face with the patient during the encounter. 50% of time spent on education, including signs/sx bleeding and clotting, as well as food and drug interactions with warfarin. 50% of time was spent on fingerprick POC INR sample collection,processing, results determination, and documentation

## 2020-11-01 ENCOUNTER — Telehealth: Payer: Self-pay

## 2020-11-01 NOTE — Telephone Encounter (Signed)
NO problems on ICD either. Nothing needs done now

## 2020-11-01 NOTE — Telephone Encounter (Signed)
Melissa from [place] [facility] called regarding mutual patient that he is a research patient and needs to submit his vitals to them today his hr was 44 she tried reaching out to patient but wasn't able to and she wanted to make [place] aware. I called patient spoke to him he feels fine no sob or cp his vitals were bp 133/66 hr 44 O2 99% his wt 261 and his glucose was 120.

## 2020-11-03 ENCOUNTER — Encounter: Payer: Self-pay | Admitting: Student

## 2020-11-03 ENCOUNTER — Other Ambulatory Visit: Payer: Self-pay

## 2020-11-03 ENCOUNTER — Ambulatory Visit: Payer: Medicare Other | Admitting: Student

## 2020-11-03 VITALS — BP 132/80 | HR 88 | Temp 97.2°F | Resp 16 | Ht 71.0 in | Wt 261.0 lb

## 2020-11-03 DIAGNOSIS — I10 Essential (primary) hypertension: Secondary | ICD-10-CM

## 2020-11-03 DIAGNOSIS — I480 Paroxysmal atrial fibrillation: Secondary | ICD-10-CM

## 2020-11-03 DIAGNOSIS — I498 Other specified cardiac arrhythmias: Secondary | ICD-10-CM

## 2020-11-03 DIAGNOSIS — I2510 Atherosclerotic heart disease of native coronary artery without angina pectoris: Secondary | ICD-10-CM

## 2020-11-03 NOTE — Progress Notes (Signed)
Primary Physician/Referring:  Sonia Side., FNP  Patient ID: Gregory [name], male    DOB: June 01, 1946, 75 y.o.   MRN: [id number]  Chief Complaint  Patient presents with  . Atrial Fibrillation  . Hypertension  . low heart rate   HPI:    Gregory [name]  is a 75 y.o. 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   Patient presents for urgent visit with concerns that his pulse oximeter at home noted his heart rate to be as low as 37 bpm this morning.  Patient is feeling well without specific complaints or symptoms.  Denies chest pain, dizziness, syncope, near syncope.  Denies orthopnea, PND, leg edema.  He continues to have dyspnea which is chronic and stable.  He also reports he continues to have occasional palpitations which are infrequent and stable.  Patient is tolerating anticoagulation with Coumadin without bleeding diathesis.  Past Medical History:  Diagnosis Date  . CAD (coronary artery disease)   . COPD (chronic obstructive pulmonary disease) ([place])   . Diabetes mellitus   . Heart attack ([place])   . Hyperlipidemia   . Hypertension   . Neuromuscular disorder ([place])   . SOB (shortness of breath) on exertion    Past Surgical History:  Procedure Laterality Date  . BACK SURGERY    . CARDIAC CATHETERIZATION    . CORONARY STENT PLACEMENT    . KNEE ARTHROPLASTY    . LEFT HEART CATH AND CORONARY ANGIOGRAPHY N/A 03/19/2019   Procedure: LEFT HEART CATH AND CORONARY ANGIOGRAPHY;  Surgeon: Nigel Mormon, MD;  Location: [facility];  Service: Cardiovascular;  Laterality: N/A;  . LEFT HEART CATHETERIZATION WITH CORONARY ANGIOGRAM N/A 03/03/2012   Procedure: LEFT HEART CATHETERIZATION WITH CORONARY ANGIOGRAM;  Surgeon: Laverda Page, MD;  Location: [facility] CATH LAB;  Service: Cardiovascular;  Laterality: N/A;  . PERCUTANEOUS CORONARY STENT INTERVENTION (PCI-S)  03/03/2012   Procedure: PERCUTANEOUS CORONARY STENT INTERVENTION (PCI-S);  Surgeon: Laverda Page, MD;  Location: [facility] CATH LAB;  Service: Cardiovascular;;   Family History  Problem Relation Age of Onset  . Hypertension Father   . Hyperlipidemia Father   . Stroke Father   . Hypertension Paternal Grandfather   . Hyperlipidemia Paternal Grandfather   . Diabetes Other        maternal uncles  . Diabetes Sister   . Cancer Mother   . Colon cancer Neg Hx    Social History   Tobacco Use  . Smoking status: Former Smoker    Packs/day: 1.00    Years: 30.00    Pack years: 30.00    Types: Cigarettes    Quit date: 07/30/2003    Years since quitting: 17.2  . Smokeless tobacco: Never Used  Substance Use Topics  . Alcohol use: Yes    Alcohol/week: 0.0 standard drinks    Comment: wine rarely    Marital Status: Married  ROS  Review of Systems  Constitutional: Negative for malaise/fatigue and weight gain.  HENT: Negative for nosebleeds.   Cardiovascular: Positive for dyspnea on exertion (chronic and stable) and palpitations (stable). Negative for chest pain, claudication, leg swelling, near-syncope, orthopnea, paroxysmal nocturnal dyspnea and syncope.  Respiratory: Negative for shortness of breath.   Hematologic/Lymphatic: Does not bruise/bleed easily.  Musculoskeletal: Positive for back pain (chronic  and 3 back operations).  Gastrointestinal: Negative for melena.  Neurological: Positive for paresthesias (fingers). Negative for dizziness and weakness.   Objective   Vitals with BMI 11/03/2020 08/21/2020 08/21/2020  Height 5' 11"  - 5' 11"   Weight 261 lbs - 259 lbs  BMI 16.38 - 45.36  Systolic 468 - 032  Diastolic 80 - 63  Pulse 88 44 45    Blood pressure 132/80, pulse 88, temperature (!) 97.2 F (36.2 C), resp. rate 16, height 5' 11"  (1.803 m), weight 261  lb (118.4 kg), SpO2 98 %. Body mass index is 36.4 kg/m.   Physical Exam Vitals reviewed.  Constitutional:      Appearance: He is obese.     Comments: He is well-built and moderately obese in no acute distress.  Cardiovascular:     Rate and Rhythm: Normal rate. Rhythm irregular.     Pulses: Normal pulses and intact distal pulses.     Heart sounds: No murmur heard. No gallop. No S3 or S4 sounds.      Comments: No JVD.   Pulmonary:     Effort: Pulmonary effort is normal.     Breath sounds: Normal breath sounds.  Abdominal:     Comments: Obese.  Musculoskeletal:     Right lower leg: Edema (trace) present.     Left lower leg: Edema (trace) present.  Neurological:     General: No focal deficit present.     Mental Status: He is alert and oriented to person, place, and time.    Laboratory examination:   Recent Labs    06/01/20 1158  NA 140  K 3.8  CL 104  CO2 26  GLUCOSE 92  BUN 16  CREATININE 1.79*  CALCIUM 9.8  GFRNONAA 39*   CMP Latest Ref Rng & Units 06/01/2020 09/13/2019 09/09/2019  Glucose 70 - 99 mg/dL 92 92 131(H)  BUN 8 - 23 mg/dL 16 29(H) 14  Creatinine 0.61 - 1.24 mg/dL 1.79(H) 1.97(H) 1.73(H)  Sodium 135 - 145 mmol/L 140 141 139  Potassium 3.5 - 5.1 mmol/L 3.8 3.7 4.2  Chloride 98 - 111 mmol/L 104 100 102  CO2 22 - 32 mmol/L 26 26 24   Calcium 8.9 - 10.3 mg/dL 9.8 9.3 9.4  Total Protein 6.5 - 8.1 g/dL 8.2(H) - 7.6  Total Bilirubin 0.3 - 1.2 mg/dL 0.8 - 2.1(H)  Alkaline Phos 38 - 126 U/L 126 - 96  AST 15 - 41 U/L 11(L) - 17  ALT 0 - 44 U/L 9 - 19   CBC Latest Ref Rng & Units 08/10/2020 07/26/2020 06/01/2020  WBC 4.0 - 10.5 K/uL 5.6 6.5 6.3  Hemoglobin 13.0 - 17.0 g/dL 12.2(L) 13.5 12.7(L)  Hematocrit 39.0 - 52.0 % 38.3(L) 42.1 39.8  Platelets 150 - 400 K/uL 196 235 204   Lipid Panel No results for input(s): CHOL, TRIG, LDLCALC, VLDL, HDL, CHOLHDL, LDLDIRECT in the last 8760 hours.  HEMOGLOBIN A1C Lab Results  Component Value Date   HGBA1C 6.0 (H)  03/18/2019   MPG 125.5 03/18/2019   BNP (last 3 results) No results for input(s): BNP in the last 8760 hours.  External Labs:   Lab Results  Component Value Date   INR 1.6 (A) 10/27/2020   INR 1.8 (A) 10/13/2020   INR 1.9 (A) 09/22/2020   External labs: Hemoglobin 13.500 g/d 07/26/2020 Platelets 235.000 K/ 07/26/2020  Creatinine, Serum 1.790 mg/ 06/01/2020 Potassium 3.800 mm 06/01/2020 ALT (SGPT) 9.000 U/L 06/01/2020 T SH 4.350 08/23/2019  Lab 01/17/2020:  Serum glucose 109 mg, BUN 55, creatinine 2.6, EGFR 29.3 mL, sodium 140, potassium 4.8.  PSA 0.777 ng/ 08/23/2019  Medications and allergies  No Known Allergies   Current Outpatient Medications  Medication Instructions  . allopurinol (ZYLOPRIM) 300 mg, Oral, Daily  . amLODipine (NORVASC) 5 MG tablet TAKE 1 TABLET BY MOUTH  DAILY  . atorvastatin (LIPITOR) 20 MG tablet TAKE 1 TABLET BY MOUTH ONCE DAILY AT  6PM  . carvedilol (COREG) 25 mg, Oral, 2 times daily  . colchicine 0.6 MG tablet 1 tablet, Oral, As needed, Take 2 tabs initially then take 1 tab in 1 hr for gout attack  . enoxaparin (LOVENOX) 120 mg, Subcutaneous, Every 12 hours  . furosemide (LASIX) 20 MG tablet TAKE 1 TABLET BY MOUTH ONCE DAILY IN THE MORNING  . glipiZIDE (GLUCOTROL) 2.5 mg, Oral, Daily before breakfast  . hydrALAZINE (APRESOLINE) 100 mg, Oral, 2 times daily  . ONETOUCH VERIO test strip 1 each, Daily  . spironolactone (ALDACTONE) 25 MG tablet TAKE 1 TABLET BY MOUTH ONCE DAILY IN THE MORNING  . tamsulosin (FLOMAX) 0.4 mg, Oral, Daily  . valsartan (DIOVAN) 80 mg, Oral, Daily  . warfarin (COUMADIN) 5 mg, Oral, Daily, Or as directed by the coumadin clinic    Radiology:   No results found.  Cardiac Studies:   Coronary Angiography 03/19/2019:  No change from 03/06/2012: LM: Normal LAD: Mid LAD focal 40% stenosis          Ostial diag-2 80% stenosis with TIMI III flow. Unchanged compared to 2013 angiography. Patent mid Diag 2 stent without ISR LCx:  Patent mid LCX overlapping stents with 10-20% late lumen loss RCA: CTO Mid 100% occlusion with grade 3 left-to-right collaterals.   Echocardiogram 09/22/2019:  Left ventricle cavity is normal in size. Mild concentric hypertrophy of the left ventricle. Normal global wall motion. Mildly depressed LV systolic function with visual EF 40-45%. Could not evaluate diastolic function, patient probably in Afib. Left atrial cavity is severely dilated. LA vol index 67 ml/m2. Trileaflet aortic valve. Trace aortic stenosis. Trace aortic regurgitation. Mild aortic valve leaflet calcification.  Moderate (Grade II) mitral regurgitation.  Moderate tricuspid regurgitation. Moderate pulmonary hypertension.  Estimated pulmonary artery systolic pressure is 50 mmHg. Estimated RA pressure 10-15 mmHg.  Compared to previous study on 03/19/2019, there is significant increase in PASP from 30 mmHg.   EKG  EKG 11/05/2020: Sinus rhythm with frequent PVCs in bigeminal pattern at a rate of 94 bpm.   EKG 08/02/2020: Atrial fibrillation with controlled ventricular response at rate of 75 beats minute, normal axis. Nonspecific T abnormality.  Single PVC.  Low-voltage complexes.  No significant change from EKG 11/04/2019   Assessment      ICD-10-CM   1. Paroxysmal atrial fibrillation (HCC)  I48.0 EKG 12-Lead  2. Ventricular bigeminy  I49.8   3. Atherosclerosis of native coronary artery of native heart without angina pectoris  I25.10   4. Essential hypertension  I10    CHA2DS2-VASc Score is 5.  Yearly risk of stroke: 7.2% (A, HTN, CAD & CHF).  Score of 1=0.6; 2=2.2; 3=3.2; 4=4.8; 5=7.2; 6=9.8; 7=>9.8) -(CHF; HTN; vasc disease DM,  Male = 1; Age <65 =0; 65-74 = 1,  >75 =2; stroke/embolism= 2).     No orders of the defined types were placed in this encounter.  Medications Discontinued During This Encounter  Medication Reason  . metFORMIN (GLUCOPHAGE) 500 MG tablet Error   Recommendations:   Fermon Ureta  is a 75 y.o.  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   Patient is in Remote Patient Monitoring and Principal Care Management as patient is high risk for hospitalization and complications from underlying medical conditions.   Patient presents for urgent visit with concerns that his home pulse oximeter noted heart rate to be 37 bpm.  Patient is asymptomatic.  EKG today reveals PVCs in bigeminal pattern.  Suspect pulse oximeter was not registering PVCs as his heart rate per EKG is 94 bpm.  As patient is asymptomatic, blood pressure is well controlled, and ventricular rate is relatively well controlled we will not make changes to his medications at this time.  Continues to tolerate anticoagulation without bleeding diathesis.  Notably patient follows in our offices anticoagulation clinic.  Patient is otherwise stable from a cardiovascular standpoint.  Follow-up in 6 months, sooner if needed, for atrial fibrillation, hyperlipidemia, and CAD.   Alethia Berthold, PA-C 11/03/2020, 3:47 PM Office: [phone]

## 2020-11-03 NOTE — Telephone Encounter (Signed)
[facility] nurse called back again stating that pt's HR has been running around 40s today. Lowest HR recorded on the pt's home pulse ox of 38. Pt continues to deny any complains of lightheadedness, dizziness, SOB, syncope. Pt scheduled to come in to do an EKG later today to evaluate for any heart rhythm abnormalities. Dr. Einar Gip and [facility] aware.

## 2020-11-17 ENCOUNTER — Ambulatory Visit: Payer: Medicare Other | Admitting: Pharmacist

## 2020-11-17 ENCOUNTER — Other Ambulatory Visit: Payer: Self-pay

## 2020-11-17 DIAGNOSIS — Z7901 Long term (current) use of anticoagulants: Secondary | ICD-10-CM

## 2020-11-17 DIAGNOSIS — Z5181 Encounter for therapeutic drug level monitoring: Secondary | ICD-10-CM

## 2020-11-17 DIAGNOSIS — I480 Paroxysmal atrial fibrillation: Secondary | ICD-10-CM

## 2020-11-17 LAB — POCT INR: INR: 2.4 (ref 2.0–3.0)

## 2020-11-17 NOTE — Progress Notes (Signed)
Anticoagulation Management Gregory [name] is a 75 y.o. male who reports to the clinic for monitoring of warfarin treatment.    Indication: atrial fibrillation CHA2DS2 Vasc Score 4 (Age 76-74, HTN hx, DM Hx, Vascular disease hx) , HAS-BLED 1 (age>65)  Duration: indefinite Supervising physician: Adrian Prows  Anticoagulation Clinic Visit History:  Patient does not report signs/symptoms of bleeding or thromboembolism   Other recent changes: No changes in diet, lifestyle.  Allopurinol dose stable at 300 mg daily. Denies any recent gout flare up.   Recent steroid injection on the left knee last week due to worsening arthritis pain. Pain improved. Pt scheduled to get possible another steroid injection in his left hip next month    Anticoagulation Episode Summary    Current INR goal:  2.0-3.0  TTR:  71.8 % (1.5 y)  Next INR check:  12/05/2020  INR from last check:  2.4 (11/17/2020)  Weekly max warfarin dose:    Target end date:  Indefinite  INR check location:    Preferred lab:    Send INR reminders to:     Indications   Atrial fibrillation (HCC) [I48.91] Monitoring for long-term anticoagulant use [Z51.81 Z79.01]       Comments:         No Known Allergies  Current Outpatient Medications:  .  enoxaparin (LOVENOX) 120 MG/0.8ML injection, Inject 0.8 mLs (120 mg total) into the skin every 12 (twelve) hours. (Patient not taking: Reported on 11/03/2020), Disp: 4 mL, Rfl: 1 .  allopurinol (ZYLOPRIM) 300 MG tablet, Take 300 mg by mouth daily., Disp: , Rfl:  .  amLODipine (NORVASC) 5 MG tablet, TAKE 1 TABLET BY MOUTH  DAILY, Disp: 90 tablet, Rfl: 3 .  atorvastatin (LIPITOR) 20 MG tablet, TAKE 1 TABLET BY MOUTH ONCE DAILY AT  6PM, Disp: 90 tablet, Rfl: 1 .  carvedilol (COREG) 25 MG tablet, Take 25 mg by mouth 2 (two) times daily., Disp: , Rfl:  .  colchicine 0.6 MG tablet, Take 1 tablet by mouth as needed. Take 2 tabs initially then take 1 tab in 1 hr for gout attack, Disp: , Rfl:  .   furosemide (LASIX) 20 MG tablet, TAKE 1 TABLET BY MOUTH ONCE DAILY IN THE MORNING, Disp: 90 tablet, Rfl: 2 .  glipiZIDE (GLUCOTROL) 5 MG tablet, Take 2.5 mg by mouth daily before breakfast., Disp: , Rfl:  .  hydrALAZINE (APRESOLINE) 100 MG tablet, Take 100 mg by mouth 2 (two) times daily. , Disp: , Rfl:  .  ONETOUCH VERIO test strip, 1 each daily., Disp: , Rfl:  .  spironolactone (ALDACTONE) 25 MG tablet, TAKE 1 TABLET BY MOUTH ONCE DAILY IN THE MORNING, Disp: 90 tablet, Rfl: 0 .  Tamsulosin HCl (FLOMAX) 0.4 MG CAPS, Take 0.4 mg by mouth daily., Disp: , Rfl:  .  valsartan (DIOVAN) 80 MG tablet, Take 80 mg by mouth daily., Disp: , Rfl:  .  warfarin (COUMADIN) 5 MG tablet, Take 1 tablet (5 mg total) by mouth daily. Or as directed by the coumadin clinic, Disp: 90 tablet, Rfl: 0 Past Medical History:  Diagnosis Date  . CAD (coronary artery disease)   . COPD (chronic obstructive pulmonary disease) ([place])   . Diabetes mellitus   . Heart attack ([place])   . Hyperlipidemia   . Hypertension   . Neuromuscular disorder ([place])   . SOB (shortness of breath) on exertion     ASSESSMENT  Recent Results: The most recent result is correlated with 32.5 mg per week:  Lab Results  Component Value Date   INR 2.4 11/17/2020   INR 1.6 (A) 10/27/2020   INR 1.8 (A) 10/13/2020    Anticoagulation Dosing: Description   INR at goal. Continue current weekly dose of 2.5 mg every Wed and 5 mg all other days. Recheck INR in 3 weeks.      INR today: Therapeutic. Improved following recent weekly dose increase. Recent steroid injection could also be contributing to increased anticoagulation effect due to DDI w/ warfarin.Pt denies any relevant changes in diet, medications, or lifestyle. Pt denies any abnormal bleeding or bruising symptoms. Will continue current weekly dose and continue close monitoring in setting of recent subtherapeutic INR readings and recent steroid injection.   PLAN Weekly dose was unchanged by 0%  to 32.5 mg/week. Continue current weekly dose of 2.5 mg every Wed and 5 mg all other days. Recheck INR in 3 weeks.   Patient Instructions  INR at goal. Continue current weekly dose of 2.5 mg every Wed and 5 mg all other days. Recheck INR in 3 weeks.   Patient advised to contact clinic or seek medical attention if signs/symptoms of bleeding or thromboembolism occur.  Patient verbalized understanding by repeating back information and was advised to contact me if further medication-related questions arise.   Follow-up Return in about 18 days (around 12/05/2020).  Alysia Penna, PharmD  15 minutes spent face-to-face with the patient during the encounter. 50% of time spent on education, including signs/sx bleeding and clotting, as well as food and drug interactions with warfarin. 50% of time was spent on fingerprick POC INR sample collection,processing, results determination, and documentation

## 2020-11-17 NOTE — Patient Instructions (Signed)
INR at goal. Continue current weekly dose of 2.5 mg every Wed and 5 mg all other days. Recheck INR in 3 weeks.

## 2020-11-25 ENCOUNTER — Other Ambulatory Visit: Payer: Self-pay | Admitting: Cardiology

## 2020-11-25 DIAGNOSIS — I10 Essential (primary) hypertension: Secondary | ICD-10-CM

## 2020-12-05 ENCOUNTER — Other Ambulatory Visit: Payer: Self-pay

## 2020-12-05 ENCOUNTER — Ambulatory Visit: Payer: Medicare Other | Admitting: Pharmacist

## 2020-12-05 DIAGNOSIS — Z7901 Long term (current) use of anticoagulants: Secondary | ICD-10-CM

## 2020-12-05 DIAGNOSIS — Z5181 Encounter for therapeutic drug level monitoring: Secondary | ICD-10-CM

## 2020-12-05 DIAGNOSIS — I480 Paroxysmal atrial fibrillation: Secondary | ICD-10-CM

## 2020-12-05 LAB — POCT INR: INR: 2 (ref 2.0–3.0)

## 2020-12-05 NOTE — Progress Notes (Signed)
Anticoagulation Management Gregory [name] is a 75 y.o. male who reports to the clinic for monitoring of warfarin treatment.    Indication: atrial fibrillation CHA2DS2 Vasc Score 4 (Age 19-74, HTN hx, DM Hx, Vascular disease hx) , HAS-BLED 1 (age>65)  Duration: indefinite Supervising physician: Adrian Prows  Anticoagulation Clinic Visit History:  Patient does not report signs/symptoms of bleeding or thromboembolism   Other recent changes: No changes in diet, lifestyle.  Allopurinol dose stable at 300 mg daily. Denies any recent gout flare up.    Anticoagulation Episode Summary     Current INR goal:  2.0-3.0  TTR:  72.7 % (1.5 y)  Next INR check:  01/02/2021  INR from last check:  2.0 (12/05/2020)  Weekly max warfarin dose:    Target end date:  Indefinite  INR check location:    Preferred lab:    Send INR reminders to:     Indications   Atrial fibrillation (HCC) [I48.91] Monitoring for long-term anticoagulant use [Z51.81 Z79.01]        Comments:          No Known Allergies  Current Outpatient Medications:    enoxaparin (LOVENOX) 120 MG/0.8ML injection, Inject 0.8 mLs (120 mg total) into the skin every 12 (twelve) hours. (Patient not taking: Reported on 11/03/2020), Disp: 4 mL, Rfl: 1   allopurinol (ZYLOPRIM) 300 MG tablet, Take 300 mg by mouth daily., Disp: , Rfl:    amLODipine (NORVASC) 5 MG tablet, TAKE 1 TABLET BY MOUTH  DAILY, Disp: 90 tablet, Rfl: 3   atorvastatin (LIPITOR) 20 MG tablet, TAKE 1 TABLET BY MOUTH ONCE DAILY AT  6PM, Disp: 90 tablet, Rfl: 1   carvedilol (COREG) 25 MG tablet, Take 25 mg by mouth 2 (two) times daily., Disp: , Rfl:    colchicine 0.6 MG tablet, Take 1 tablet by mouth as needed. Take 2 tabs initially then take 1 tab in 1 hr for gout attack, Disp: , Rfl:    furosemide (LASIX) 20 MG tablet, TAKE 1 TABLET BY MOUTH ONCE DAILY IN THE MORNING, Disp: 90 tablet, Rfl: 0   glipiZIDE (GLUCOTROL) 5 MG tablet, Take 2.5 mg by mouth daily before breakfast.,  Disp: , Rfl:    hydrALAZINE (APRESOLINE) 100 MG tablet, Take 100 mg by mouth 2 (two) times daily. , Disp: , Rfl:    ONETOUCH VERIO test strip, 1 each daily., Disp: , Rfl:    spironolactone (ALDACTONE) 25 MG tablet, TAKE 1 TABLET BY MOUTH ONCE DAILY IN THE MORNING, Disp: 90 tablet, Rfl: 0   Tamsulosin HCl (FLOMAX) 0.4 MG CAPS, Take 0.4 mg by mouth daily., Disp: , Rfl:    valsartan (DIOVAN) 80 MG tablet, Take 80 mg by mouth daily., Disp: , Rfl:    warfarin (COUMADIN) 5 MG tablet, Take 1 tablet (5 mg total) by mouth daily. Or as directed by the coumadin clinic, Disp: 90 tablet, Rfl: 0 Past Medical History:  Diagnosis Date   CAD (coronary artery disease)    COPD (chronic obstructive pulmonary disease) ([place])    Diabetes mellitus    Heart attack ([place])    Hyperlipidemia    Hypertension    Neuromuscular disorder ([place])    SOB (shortness of breath) on exertion     ASSESSMENT  Recent Results: The most recent result is correlated with 32.5 mg per week:  Lab Results  Component Value Date   INR 2.0 12/05/2020   INR 2.4 11/17/2020   INR 1.6 (A) 10/27/2020    Anticoagulation Dosing: Description  INR at goal. Continue current weekly dose of 2.5 mg every Wed and 5 mg all other days. Recheck INR in 4 weeks.      INR today: Therapeutic. Continues to remain therapeutic on current weekly dose. Pt denies any relevant changes in diet, medications, or lifestyle. Pt denies any unusual bleeding or bruising symptoms. Will continue current weekly dose and continue chronic monitoring.   PLAN Weekly dose was unchanged by 0% to 32.5 mg/week. Continue current weekly dose of 2.5 mg every Wed and 5 mg all other days. Recheck INR in 4 weeks.   Patient Instructions  INR at goal. Continue current weekly dose of 2.5 mg every Wed and 5 mg all other days. Recheck INR in 4 weeks.  Patient advised to contact clinic or seek medical attention if signs/symptoms of bleeding or thromboembolism occur.  Patient  verbalized understanding by repeating back information and was advised to contact me if further medication-related questions arise.   Follow-up Return in about 4 weeks (around 01/02/2021).  Alysia Penna, PharmD  15 minutes spent face-to-face with the patient during the encounter. 50% of time spent on education, including signs/sx bleeding and clotting, as well as food and drug interactions with warfarin. 50% of time was spent on fingerprick POC INR sample collection,processing, results determination, and documentation

## 2020-12-05 NOTE — Patient Instructions (Signed)
INR at goal. Continue current weekly dose of 2.5 mg every Wed and 5 mg all other days. Recheck INR in 4 weeks.

## 2020-12-18 ENCOUNTER — Telehealth: Payer: Self-pay

## 2020-12-20 ENCOUNTER — Telehealth: Payer: Self-pay

## 2020-12-20 NOTE — Telephone Encounter (Signed)
RN from [facility] called with information on patient. Called transferred to [name], PharmD.

## 2020-12-20 NOTE — Telephone Encounter (Signed)
Please call and enquire how he is and if still problems, set up appointment with me or [facility]

## 2020-12-21 NOTE — Telephone Encounter (Signed)
Spoke to patient this morning he said he is feeling great his hr this morning was 98 and his bp was 149/88

## 2021-01-01 ENCOUNTER — Ambulatory Visit: Payer: Medicare Other | Admitting: Pharmacist

## 2021-01-01 ENCOUNTER — Other Ambulatory Visit: Payer: Self-pay

## 2021-01-01 DIAGNOSIS — I4821 Permanent atrial fibrillation: Secondary | ICD-10-CM

## 2021-01-01 DIAGNOSIS — Z7901 Long term (current) use of anticoagulants: Secondary | ICD-10-CM

## 2021-01-01 DIAGNOSIS — Z5181 Encounter for therapeutic drug level monitoring: Secondary | ICD-10-CM

## 2021-01-01 LAB — POCT INR: INR: 3 (ref 2.0–3.0)

## 2021-01-01 NOTE — Progress Notes (Signed)
Anticoagulation Management Gregory [name] is a 75 y.o. male who reports to the clinic for monitoring of warfarin treatment.    Indication: atrial fibrillation CHA2DS2 Vasc Score 4 (Age 62-74, HTN hx, DM Hx, Vascular disease hx) , HAS-BLED 1 (age>65)  Duration: indefinite Supervising physician: Adrian Prows  Anticoagulation Clinic Visit History:  Patient does not report signs/symptoms of bleeding or thromboembolism   Other recent changes: No changes in diet, lifestyle.  Allopurinol dose stable at 300 mg daily. Denies any recent gout flare. Denies any complains of lightheadedness or dizziness. Denies any complains of dyspnea or SOB. Pt to work on weight loss goals with calorie intake reduction and increased activity level.    Anticoagulation Episode Summary     Current INR goal:  2.0-3.0  TTR:  74.0 % (1.6 y)  Next INR check:  01/29/2021  INR from last check:  3.0 (01/01/2021)  Weekly max warfarin dose:    Target end date:  Indefinite  INR check location:    Preferred lab:    Send INR reminders to:     Indications   Atrial fibrillation (HCC) [I48.91] Monitoring for long-term anticoagulant use [Z51.81 Z79.01]        Comments:          No Known Allergies  Current Outpatient Medications:    enoxaparin (LOVENOX) 120 MG/0.8ML injection, Inject 0.8 mLs (120 mg total) into the skin every 12 (twelve) hours. (Patient not taking: Reported on 11/03/2020), Disp: 4 mL, Rfl: 1   allopurinol (ZYLOPRIM) 300 MG tablet, Take 300 mg by mouth daily., Disp: , Rfl:    amLODipine (NORVASC) 5 MG tablet, TAKE 1 TABLET BY MOUTH  DAILY, Disp: 90 tablet, Rfl: 3   atorvastatin (LIPITOR) 20 MG tablet, TAKE 1 TABLET BY MOUTH ONCE DAILY AT  6PM, Disp: 90 tablet, Rfl: 1   carvedilol (COREG) 25 MG tablet, Take 25 mg by mouth 2 (two) times daily., Disp: , Rfl:    colchicine 0.6 MG tablet, Take 1 tablet by mouth as needed. Take 2 tabs initially then take 1 tab in 1 hr for gout attack, Disp: , Rfl:    furosemide  (LASIX) 20 MG tablet, TAKE 1 TABLET BY MOUTH ONCE DAILY IN THE MORNING, Disp: 90 tablet, Rfl: 0   glipiZIDE (GLUCOTROL) 5 MG tablet, Take 2.5 mg by mouth daily before breakfast., Disp: , Rfl:    hydrALAZINE (APRESOLINE) 100 MG tablet, Take 100 mg by mouth 2 (two) times daily. , Disp: , Rfl:    ONETOUCH VERIO test strip, 1 each daily., Disp: , Rfl:    spironolactone (ALDACTONE) 25 MG tablet, TAKE 1 TABLET BY MOUTH ONCE DAILY IN THE MORNING, Disp: 90 tablet, Rfl: 0   Tamsulosin HCl (FLOMAX) 0.4 MG CAPS, Take 0.4 mg by mouth daily., Disp: , Rfl:    valsartan (DIOVAN) 80 MG tablet, Take 80 mg by mouth daily., Disp: , Rfl:    warfarin (COUMADIN) 5 MG tablet, Take 1 tablet (5 mg total) by mouth daily. Or as directed by the coumadin clinic, Disp: 90 tablet, Rfl: 0 Past Medical History:  Diagnosis Date   CAD (coronary artery disease)    COPD (chronic obstructive pulmonary disease) ([place])    Diabetes mellitus    Heart attack ([place])    Hyperlipidemia    Hypertension    Neuromuscular disorder ([place])    SOB (shortness of breath) on exertion     ASSESSMENT  Recent Results: The most recent result is correlated with 32.5 mg per week:  Lab Results  Component Value Date   INR 3.0 01/01/2021   INR 2.0 12/05/2020   INR 2.4 11/17/2020    Anticoagulation Dosing: Description   INR at goal. Continue current weekly dose of 2.5 mg every Wed and 5 mg all other days. Recheck INR in 4 weeks.      INR today: Therapeutic. Continues to remain therapeutic on current weekly dose. Pt denies any relevant changes in diet, medications, or lifestyle. Pt denies any unusual bleeding or bruising symptoms. Will continue current weekly dose and continue chronic monitoring.   PLAN Weekly dose was unchanged by 0% to 32.5 mg/week. Continue current weekly dose of 2.5 mg every Wed and 5 mg all other days. Recheck INR in 4 weeks.   Patient Instructions  INR at goal. Continue current weekly dose of 2.5 mg every Wed and 5 mg  all other days. Recheck INR in 4 weeks.  Patient advised to contact clinic or seek medical attention if signs/symptoms of bleeding or thromboembolism occur.  Patient verbalized understanding by repeating back information and was advised to contact me if further medication-related questions arise.   Follow-up Return in about 4 weeks (around 01/29/2021).  Alysia Penna, PharmD  15 minutes spent face-to-face with the patient during the encounter. 50% of time spent on education, including signs/sx bleeding and clotting, as well as food and drug interactions with warfarin. 50% of time was spent on fingerprick POC INR sample collection,processing, results determination, and documentation

## 2021-01-01 NOTE — Patient Instructions (Signed)
INR at goal. Continue current weekly dose of 2.5 mg every Wed and 5 mg all other days. Recheck INR in 4 weeks.

## 2021-01-18 IMAGING — CR DG CHEST 2V
2 series · 2 of 2 positions shown · non-contrast
Comparison: May 03, 2015

CLINICAL DATA: Chest pain

EXAM:
CHEST - 2 VIEW

[chest pa]
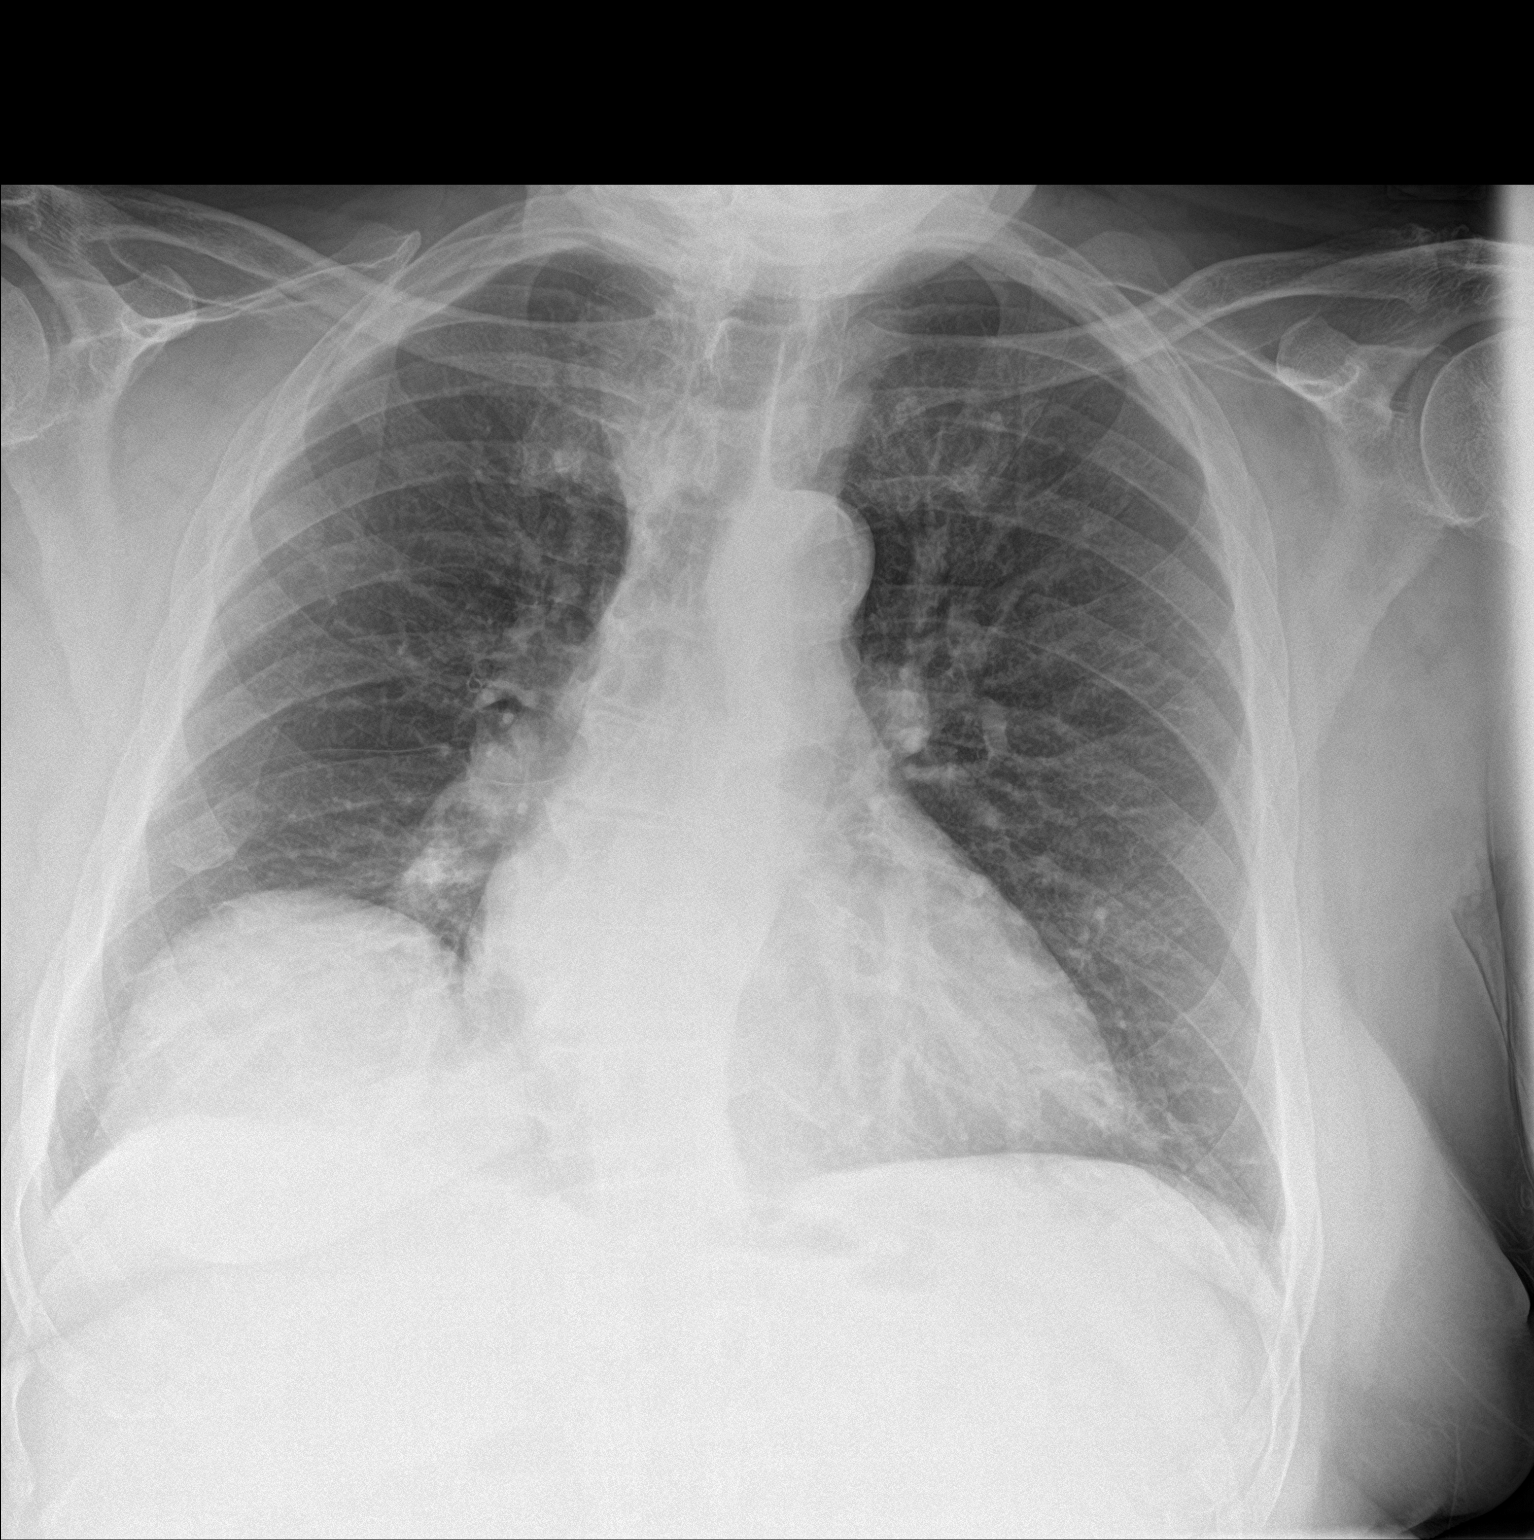

[chest lat]
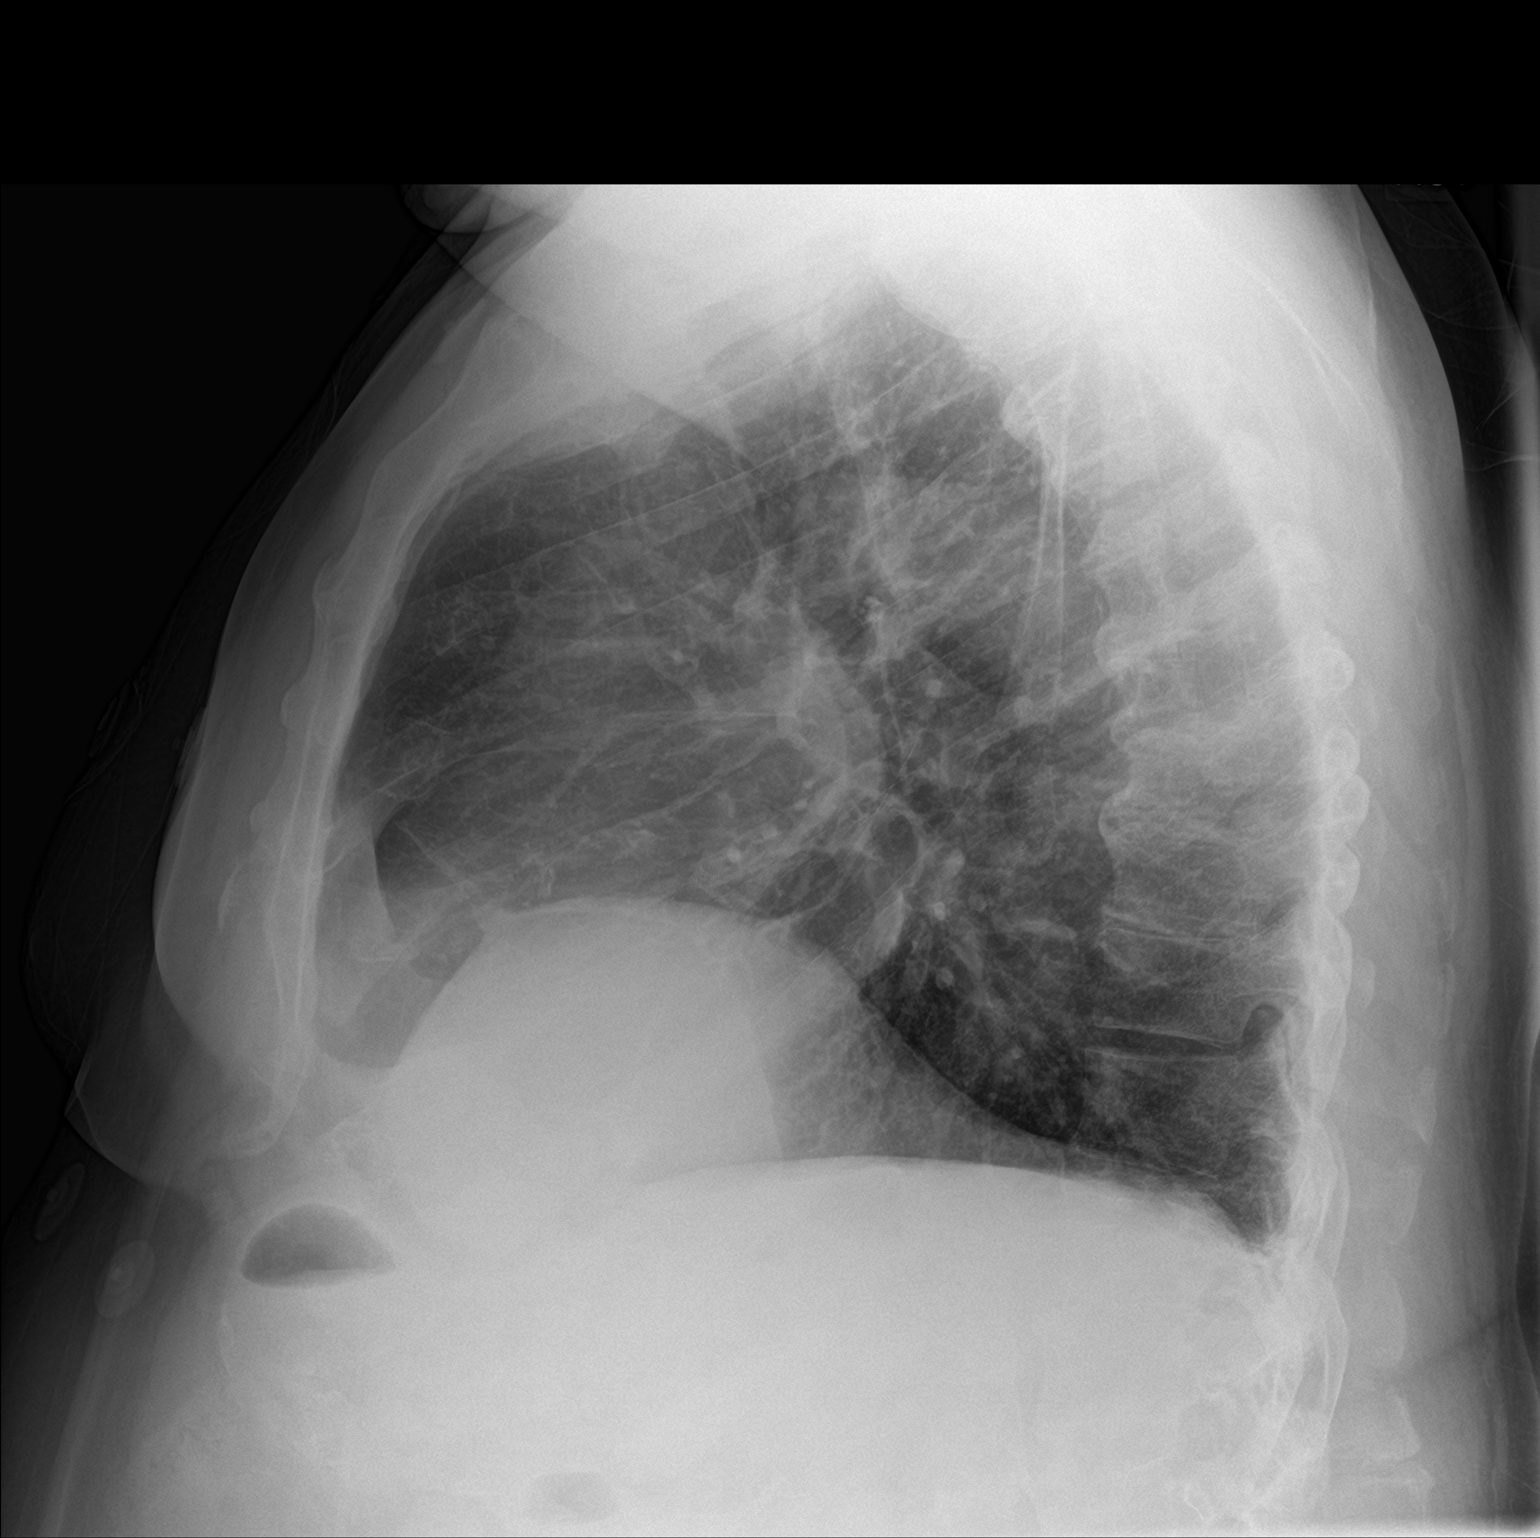

[2 of 2 positions shown; findings below may reference images not displayed]

FINDINGS: There is mild cardiomegaly. Elevation of the right hemidiaphragm is
seen. The lungs are clear. No focal airspace consolidation or
pleural effusion. No acute osseous abnormality.
IMPRESSION: No acute cardiopulmonary process.

## 2021-01-20 ENCOUNTER — Other Ambulatory Visit: Payer: Self-pay | Admitting: Cardiology

## 2021-01-20 DIAGNOSIS — Z7901 Long term (current) use of anticoagulants: Secondary | ICD-10-CM

## 2021-01-20 DIAGNOSIS — I4821 Permanent atrial fibrillation: Secondary | ICD-10-CM

## 2021-01-20 DIAGNOSIS — Z5181 Encounter for therapeutic drug level monitoring: Secondary | ICD-10-CM

## 2021-01-29 ENCOUNTER — Other Ambulatory Visit: Payer: Self-pay

## 2021-01-29 ENCOUNTER — Ambulatory Visit: Payer: Medicare Other | Admitting: Pharmacist

## 2021-01-29 DIAGNOSIS — Z5181 Encounter for therapeutic drug level monitoring: Secondary | ICD-10-CM

## 2021-01-29 DIAGNOSIS — I4821 Permanent atrial fibrillation: Secondary | ICD-10-CM

## 2021-01-29 DIAGNOSIS — Z7901 Long term (current) use of anticoagulants: Secondary | ICD-10-CM

## 2021-01-29 LAB — POCT INR: INR: 2.9 (ref 2.0–3.0)

## 2021-01-29 NOTE — Progress Notes (Signed)
Anticoagulation Management Gregory [name] is a 75 y.o. male who reports to the clinic for monitoring of warfarin treatment.    Indication: atrial fibrillation CHA2DS2 Vasc Score 4 (Age 28-74, HTN hx, DM Hx, Vascular disease hx) , HAS-BLED 1 (age>65)  Duration: indefinite Supervising physician: Adrian Prows  Anticoagulation Clinic Visit History:  Patient does not report signs/symptoms of bleeding or thromboembolism   Other recent changes: No changes in diet, lifestyle.  Pt reports that he is scheduled for 2/3 synovial fluid injection on his left knee. Pt also reported that he recent had a urine analysis done with his PCP and was told that he had UTI. Was recommended to start Cipro 500 mg x7 days. Pt to start taking Abx today.   Anticoagulation Episode Summary     Current INR goal:  2.0-3.0  TTR:  75.2 % (1.7 y)  Next INR check:  02/08/2021  INR from last check:  2.9 (01/29/2021)  Weekly max warfarin dose:    Target end date:  Indefinite  INR check location:    Preferred lab:    Send INR reminders to:     Indications   Atrial fibrillation (HCC) [I48.91] Monitoring for long-term anticoagulant use [Z51.81 Z79.01]        Comments:          No Known Allergies  Current Outpatient Medications:    enoxaparin (LOVENOX) 120 MG/0.8ML injection, Inject 0.8 mLs (120 mg total) into the skin every 12 (twelve) hours. (Patient not taking: Reported on 11/03/2020), Disp: 4 mL, Rfl: 1   allopurinol (ZYLOPRIM) 300 MG tablet, Take 300 mg by mouth daily., Disp: , Rfl:    amLODipine (NORVASC) 5 MG tablet, TAKE 1 TABLET BY MOUTH  DAILY, Disp: 90 tablet, Rfl: 3   atorvastatin (LIPITOR) 20 MG tablet, TAKE 1 TABLET BY MOUTH ONCE DAILY AT  6PM, Disp: 90 tablet, Rfl: 1   carvedilol (COREG) 25 MG tablet, Take 25 mg by mouth 2 (two) times daily., Disp: , Rfl:    ciprofloxacin (CIPRO) 500 MG tablet, Take 1 tablet by mouth every 12 (twelve) hours., Disp: , Rfl:    colchicine 0.6 MG tablet, Take 1 tablet by  mouth as needed. Take 2 tabs initially then take 1 tab in 1 hr for gout attack, Disp: , Rfl:    furosemide (LASIX) 20 MG tablet, TAKE 1 TABLET BY MOUTH ONCE DAILY IN THE MORNING, Disp: 90 tablet, Rfl: 0   glipiZIDE (GLUCOTROL) 5 MG tablet, Take 2.5 mg by mouth daily before breakfast., Disp: , Rfl:    hydrALAZINE (APRESOLINE) 100 MG tablet, Take 100 mg by mouth 2 (two) times daily. , Disp: , Rfl:    ONETOUCH VERIO test strip, 1 each daily., Disp: , Rfl:    spironolactone (ALDACTONE) 25 MG tablet, TAKE 1 TABLET BY MOUTH ONCE DAILY IN THE MORNING, Disp: 90 tablet, Rfl: 0   Tamsulosin HCl (FLOMAX) 0.4 MG CAPS, Take 0.4 mg by mouth daily., Disp: , Rfl:    valsartan (DIOVAN) 80 MG tablet, Take 80 mg by mouth daily., Disp: , Rfl:    warfarin (COUMADIN) 5 MG tablet, TAKE 1 TABLET BY MOUTH ONCE DAILY (OR  AS  DIRECTED  BY  THE  COUMADIN  CLINIC), Disp: 90 tablet, Rfl: 0 Past Medical History:  Diagnosis Date   CAD (coronary artery disease)    COPD (chronic obstructive pulmonary disease) (HCC)    Diabetes mellitus    Heart attack ([place])    Hyperlipidemia    Hypertension  Neuromuscular disorder (HCC)    SOB (shortness of breath) on exertion    ASSESSMENT  Recent Results: The most recent result is correlated with 32.5 mg per week:  Lab Results  Component Value Date   INR 2.9 01/29/2021   INR 3.0 01/01/2021   INR 2.0 12/05/2020    Anticoagulation Dosing: Description   INR at goal. Continue current weekly dose of 2.5 mg every Wed and 5 mg all other days. Recheck INR in 1 weeks.      INR today: Therapeutic. Continues to remain therapeutic on current weekly dose. New start Cipro could potentially increase anticoagulation effect of VKA. Pt has been previously therapeutic on current weekly dose. Pt denies any relevant changes in diet, medications, or lifestyle. Pt denies any unusual bleeding or bruising symptoms. Encouraged pt increase his Vit K intake in setting of Abx. Will continue close  monitoring to ensure INR remain therapeutic  PLAN Weekly dose was unchanged by 0% to 32.5 mg/week. Continue current weekly dose of 2.5 mg every Wed and 5 mg all other days. Recheck INR in 1 weeks.   Patient Instructions  INR at goal. Continue current weekly dose of 2.5 mg every Wed and 5 mg all other days. Recheck INR in 1 weeks.  Patient advised to contact clinic or seek medical attention if signs/symptoms of bleeding or thromboembolism occur.  Patient verbalized understanding by repeating back information and was advised to contact me if further medication-related questions arise.   Follow-up Return in about 10 days (around 02/08/2021).  Alysia Penna, PharmD  15 minutes spent face-to-face with the patient during the encounter. 50% of time spent on education, including signs/sx bleeding and clotting, as well as food and drug interactions with warfarin. 50% of time was spent on fingerprick POC INR sample collection,processing, results determination, and documentation

## 2021-01-29 NOTE — Patient Instructions (Signed)
INR at goal. Continue current weekly dose of 2.5 mg every Wed and 5 mg all other days. Recheck INR in 1 weeks.

## 2021-01-31 ENCOUNTER — Ambulatory Visit: Payer: Medicare Other | Admitting: Cardiology

## 2021-02-08 ENCOUNTER — Ambulatory Visit: Payer: Medicare Other | Admitting: Pharmacist

## 2021-02-08 ENCOUNTER — Other Ambulatory Visit: Payer: Self-pay

## 2021-02-08 DIAGNOSIS — I4821 Permanent atrial fibrillation: Secondary | ICD-10-CM

## 2021-02-08 DIAGNOSIS — Z5181 Encounter for therapeutic drug level monitoring: Secondary | ICD-10-CM

## 2021-02-08 DIAGNOSIS — Z7901 Long term (current) use of anticoagulants: Secondary | ICD-10-CM

## 2021-02-08 LAB — POCT INR: INR: 3.2 — AB (ref 2.0–3.0)

## 2021-02-08 NOTE — Progress Notes (Signed)
Anticoagulation Management Gregory [name] is a 75 y.o. male who reports to the clinic for monitoring of warfarin treatment.    Indication: atrial fibrillation CHA2DS2 Vasc Score 4 (Age 55-74, HTN hx, DM Hx, Vascular disease hx) , HAS-BLED 1 (age>65)  Duration: indefinite Supervising physician: Adrian Prows  Anticoagulation Clinic Visit History:  Patient does not report signs/symptoms of bleeding or thromboembolism   Other recent changes: No changes in diet, lifestyle.  Pt reports that he never got started on his Abx for UTI because he misplaced his prescription bottle. Denies hematuria, frequent/painful urination or dysuria. Pt has a f/u OV with PCP next month.   Pt reports drinking frequent cranberry juice over the past week. Reports that he doesn't plan on continuing this moving forward.   Anticoagulation Episode Summary     Current INR goal:  2.0-3.0  TTR:  74.5 % (1.7 y)  Next INR check:  03/01/2021  INR from last check:  3.2 (02/08/2021)  Weekly max warfarin dose:    Target end date:  Indefinite  INR check location:    Preferred lab:    Send INR reminders to:     Indications   Atrial fibrillation (HCC) [I48.91] Monitoring for long-term anticoagulant use [Z51.81 Z79.01]        Comments:          No Known Allergies  Current Outpatient Medications:    enoxaparin (LOVENOX) 120 MG/0.8ML injection, Inject 0.8 mLs (120 mg total) into the skin every 12 (twelve) hours. (Patient not taking: Reported on 11/03/2020), Disp: 4 mL, Rfl: 1   allopurinol (ZYLOPRIM) 300 MG tablet, Take 300 mg by mouth daily., Disp: , Rfl:    amLODipine (NORVASC) 5 MG tablet, TAKE 1 TABLET BY MOUTH  DAILY, Disp: 90 tablet, Rfl: 3   atorvastatin (LIPITOR) 20 MG tablet, TAKE 1 TABLET BY MOUTH ONCE DAILY AT  6PM, Disp: 90 tablet, Rfl: 1   carvedilol (COREG) 25 MG tablet, Take 25 mg by mouth 2 (two) times daily., Disp: , Rfl:    ciprofloxacin (CIPRO) 500 MG tablet, Take 1 tablet by mouth every 12 (twelve)  hours., Disp: , Rfl:    colchicine 0.6 MG tablet, Take 1 tablet by mouth as needed. Take 2 tabs initially then take 1 tab in 1 hr for gout attack, Disp: , Rfl:    furosemide (LASIX) 20 MG tablet, TAKE 1 TABLET BY MOUTH ONCE DAILY IN THE MORNING, Disp: 90 tablet, Rfl: 0   glipiZIDE (GLUCOTROL) 5 MG tablet, Take 2.5 mg by mouth daily before breakfast., Disp: , Rfl:    hydrALAZINE (APRESOLINE) 100 MG tablet, Take 100 mg by mouth 2 (two) times daily. , Disp: , Rfl:    ONETOUCH VERIO test strip, 1 each daily., Disp: , Rfl:    spironolactone (ALDACTONE) 25 MG tablet, TAKE 1 TABLET BY MOUTH ONCE DAILY IN THE MORNING, Disp: 90 tablet, Rfl: 0   Tamsulosin HCl (FLOMAX) 0.4 MG CAPS, Take 0.4 mg by mouth daily., Disp: , Rfl:    valsartan (DIOVAN) 80 MG tablet, Take 80 mg by mouth daily., Disp: , Rfl:    warfarin (COUMADIN) 5 MG tablet, TAKE 1 TABLET BY MOUTH ONCE DAILY (OR  AS  DIRECTED  BY  THE  COUMADIN  CLINIC), Disp: 90 tablet, Rfl: 0 Past Medical History:  Diagnosis Date   CAD (coronary artery disease)    COPD (chronic obstructive pulmonary disease) (HCC)    Diabetes mellitus    Heart attack ([place])    Hyperlipidemia  Hypertension    Neuromuscular disorder (HCC)    SOB (shortness of breath) on exertion    ASSESSMENT  Recent Results: The most recent result is correlated with 32.5 mg per week:  Lab Results  Component Value Date   INR 3.2 (A) 02/08/2021   INR 2.9 01/29/2021   INR 3.0 01/01/2021    Anticoagulation Dosing: Description   INR above goal. Continue current weekly dose of 2.5 mg every Wed and 5 mg all other days. Recheck INR in 3 weeks.      INR today: Supratherapeutic. Possibly in setting of new start cranberry juice intake. Mixed evidence of increased VIT K's anticoagulation effect in setting of cranberry juice intake. Pt confirmed to have never started his Abx. Denies any other relevant changes in diet, medications, or lifestyles. Denies any bleeding or bruising symptoms. Pt  was previously therapeutic on current weekly dose. Pt planing on discontinuing cranberry juice intake moving forward. Encouraged pt to maintain steady Vit K intake. Will continue  close monitoring and follow up to ensure INR returns back within therapeutic range.   PLAN Weekly dose was unchanged by 0% to 32.5 mg/week. Continue current weekly dose of 2.5 mg every Wed and 5 mg all other days. Recheck INR in 3 weeks.   Patient Instructions  INR above goal. Continue current weekly dose of 2.5 mg every Wed and 5 mg all other days. Recheck INR in 3 weeks.  Patient advised to contact clinic or seek medical attention if signs/symptoms of bleeding or thromboembolism occur.  Patient verbalized understanding by repeating back information and was advised to contact me if further medication-related questions arise.   Follow-up Return in about 3 weeks (around 03/01/2021).  Alysia Penna, PharmD  15 minutes spent face-to-face with the patient during the encounter. 50% of time spent on education, including signs/sx bleeding and clotting, as well as food and drug interactions with warfarin. 50% of time was spent on fingerprick POC INR sample collection,processing, results determination, and documentation

## 2021-02-08 NOTE — Patient Instructions (Signed)
INR above goal. Continue current weekly dose of 2.5 mg every Wed and 5 mg all other days. Recheck INR in 3 weeks.

## 2021-02-19 NOTE — Progress Notes (Deleted)
[facility] NOTE  Patient Care Team: Gregory Side., FNP as PCP - General (Family Medicine)  CHIEF COMPLAINTS/PURPOSE OF CONSULTATION:  Abnormal labs.  ASSESSMENT & PLAN:  No problem-specific Assessment & Plan notes found for this encounter.  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  Bone marrow biopsy with no evidence of multiple myeloma.  Bone survey with no evidence for lytic bone lesions.  Degenerative changes throughout the spine, hips and left knee.  Baseline urine protein electrophoresis with no M spike.  He is here for 71-monthfollow-up.  2. CKD stage III Unrelated to the MGUS. He should continue FU with his nephrologist.  No orders of the defined types were placed in this encounter.   HISTORY OF PRESENTING ILLNESS:  [name].[id number]. Gregory [name] is here because of some abnormal labs, specifically increased free light chains.  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  Interim History  SOCIAL HISTORY  He retired from [employer] company.   He quit smoking in 2005.   Rest of the pertinent 10 point ROS as mentioned below and negative  REVIEW OF SYSTEMS:   Constitutional: Denies fevers, chills or abnormal night sweats Eyes: Denies blurriness of vision, double vision or watery eyes Ears, nose, mouth, throat, and face: Denies mucositis or sore throat Respiratory: Denies cough, dyspnea or wheezes Cardiovascular: Denies palpitation, chest  discomfort or lower extremity swelling Gastrointestinal:  Denies nausea, heartburn or change in bowel habits Skin: Denies abnormal skin rashes Lymphatics: Denies new lymphadenopathy or easy bruising Neurological:Denies numbness, tingling or new weaknesses Behavioral/Psych: Mood is stable, no new changes  All other systems were reviewed with the patient and are negative.  MEDICAL HISTORY:  Past Medical History:  Diagnosis Date   CAD (coronary artery disease)    COPD (ch[place] obstructive pulmonary disease) ([facility]    Diabetes mellitus    Heart attack ([facility]    Hyperlipidemia    Hypertension    Neuromuscular disorder (HCC)    SOB (shortness of breath) on exertion     SURGICAL HISTORY: Past Surgical History:  Procedure Laterality Date   BACK SURGERY     CARDIAC CATHETERIZATION     CORONARY STENT PLACEMENT     KNEE ARTHROPLASTY     LEFT HEART CATH AND CORONARY ANGIOGRAPHY N/A 03/19/2019   Procedure: LEFT HEART CATH AND CORONARY ANGIOGRAPHY;  Surgeon: PNigel Mormon MD;  [facility];  Service: Cardiovascular;  Laterality: N/A;   LEFT HEART CATHETERIZATION WITH CORONARY ANGIOGRAM N/A 03/03/2012   Procedure: LEFT HEART CATHETERIZATION WITH CORONARY ANGIOGRAM;  Surgeon: JLaverda Page MD;  [facility];  Service: Cardiovascular;  Laterality: N/A;   PERCUTANEOUS CORONARY STENT INTERVENTION (PCI-S)  03/03/2012   Procedure: PERCUTANEOUS CORONARY STENT INTERVENTION (PCI-S);  Surgeon: JLaverda Page MD;  [facility];  Service: Cardiovascular;;    SOCIAL HISTORY: Social History   Socioeconomic History   Marital status: Married    Spouse name: Not on file   Number of children: 3   Years  of education: Not on file   Highest education level: Not on file  Occupational History   Occupation: retired  Tobacco Use   Smoking status: Former    Packs/day: 1.00    Years: 30.00    Pack years: 30.00    Types: Cigarettes    Quit date: 07/30/2003     Years since quitting: 17.5   Smokeless tobacco: Never  Vaping Use   Vaping Use: Never used  Substance and Sexual Activity   Alcohol use: Yes    Alcohol/week: 0.0 standard drinks    Comment: wine rarely   Drug use: No   Sexual activity: Not on file  Other Topics Concern   Not on file  Social History Narrative   Not on file   Social Determinants of Health   Financial Resource Strain: Not on file  Food Insecurity: Not on file  Transportation Needs: Not on file  Physical Activity: Not on file  Stress: Not on file  Social Connections: Not on file  Intimate Partner Violence: Not on file    FAMILY HISTORY: Family History  Problem Relation Age of Onset   Hypertension Father    Hyperlipidemia Father    Stroke Father    Hypertension Paternal Grandfather    Hyperlipidemia Paternal Grandfather    Diabetes Other        maternal uncles   Diabetes Sister    Cancer Mother    Colon cancer Neg Hx     ALLERGIES:  has No Known Allergies.  MEDICATIONS:  Current Outpatient Medications  Medication Sig Dispense Refill   enoxaparin (LOVENOX) 120 MG/0.8ML injection Inject 0.8 mLs (120 mg total) into the skin every 12 (twelve) hours. (Patient not taking: Reported on 11/03/2020) 4 mL 1   allopurinol (ZYLOPRIM) 300 MG tablet Take 300 mg by mouth daily.     amLODipine (NORVASC) 5 MG tablet TAKE 1 TABLET BY MOUTH  DAILY 90 tablet 3   atorvastatin (LIPITOR) 20 MG tablet TAKE 1 TABLET BY MOUTH ONCE DAILY AT  6PM 90 tablet 1   carvedilol (COREG) 25 MG tablet Take 25 mg by mouth 2 (two) times daily.     ciprofloxacin (CIPRO) 500 MG tablet Take 1 tablet by mouth every 12 (twelve) hours.     colchicine 0.6 MG tablet Take 1 tablet by mouth as needed. Take 2 tabs initially then take 1 tab in 1 hr for gout attack     furosemide (LASIX) 20 MG tablet TAKE 1 TABLET BY MOUTH ONCE DAILY IN THE MORNING 90 tablet 0   glipiZIDE (GLUCOTROL) 5 MG tablet Take 2.5 mg by mouth daily before breakfast.      hydrALAZINE (APRESOLINE) 100 MG tablet Take 100 mg by mouth 2 (two) times daily.      ONETOUCH VERIO test strip 1 each daily.     spironolactone (ALDACTONE) 25 MG tablet TAKE 1 TABLET BY MOUTH ONCE DAILY IN THE MORNING 90 tablet 0   Tamsulosin HCl (FLOMAX) 0.4 MG CAPS Take 0.4 mg by mouth daily.     valsartan (DIOVAN) 80 MG tablet Take 80 mg by mouth daily.     warfarin (COUMADIN) 5 MG tablet TAKE 1 TABLET BY MOUTH ONCE DAILY (OR  AS  DIRECTED  BY  THE  COUMADIN  CLINIC) 90 tablet 0   No current facility-administered medications for this visit.     PHYSICAL EXAMINATION: ECOG PERFORMANCE STATUS: 0 - Asymptomatic  There were no vitals filed for this visit.  There were no vitals  filed for this visit.   GENERAL:alert, no distress and comfortable, walks with a cane. SKIN: skin color, texture, turgor are normal, no rashes or significant lesions EYES: normal, conjunctiva are pink and non-injected, sclera clear OROPHARYNX:no exudate, no erythema and lips, buccal mucosa, and tongue normal  NECK: supple, thyroid normal size, non-tender, without nodularity LYMPH:  no palpable lymphadenopathy in the cervical, axillary or inguinal LUNGS: clear to auscultation and percussion with normal breathing effort HEART: regular rate & rhythm. S 3 heard. BLE edema. ABDOMEN:abdomen soft, non-tender and normal bowel sounds Musculoskeletal:no cyanosis of digits and no clubbing  PSYCH: alert & oriented x 3 with fluent speech NEURO: no focal motor/sensory deficits  LABORATORY DATA:  I have reviewed the data as listed Lab Results  Component Value Date   WBC 5.6 08/10/2020   HGB 12.2 (L) 08/10/2020   HCT 38.3 (L) 08/10/2020   MCV 84.5 08/10/2020   PLT 196 08/10/2020     Chemistry      Component Value Date/Time   NA 140 06/01/2020 1158   NA 141 09/13/2019 1310   K 3.8 06/01/2020 1158   CL 104 06/01/2020 1158   CO2 26 06/01/2020 1158   BUN 16 06/01/2020 1158   BUN 29 (H) 09/13/2019 1310    CREATININE 1.79 (H) 06/01/2020 1158      Component Value Date/Time   CALCIUM 9.8 06/01/2020 1158   ALKPHOS 126 06/01/2020 1158   AST 11 (L) 06/01/2020 1158   ALT 9 06/01/2020 1158   BILITOT 0.8 06/01/2020 1158      CMP with normal calcium, normal total protein and normal alk phos.    Creatinine of 1.68 Hb of 12 No M spike on SPEP Urine protein minimal. Monoclonal protein on urine not examined. K/L ratio of 3.52  Surgical Pathology   Reason for Addendum #1:  Molecular Genetic Test Results, FISH   Clinical History: Monoclonal gammopathy      DIAGNOSIS:   BONE MARROW, ASPIRATE, CLOT, CORE:  -  Normocellular bone marrow with trilineage hematopoiesis and mild  plasmacytosis  -  See comment   PERIPHERAL BLOOD:  -  Normocytic anemia     COMMENT:   Plasma cells are mildly increased on aspirate smears (4% by manual  differential count), with scattered plasma cells showing atypical  morphology.  CD138 immunohistochemistry on the clot and core biopsy  highlight plasma cells comprising 3-5% and 2% of overall marrow  cellularity, respectively. Light chain in situ hybridization reveals a  mixture of kappa and lambda expressing plasma cells, with focal plasma  cell groups showing kappa predominance. Together, low-level involvement  by a plasma cell neoplasm cannot be excluded.  Correlation with clinical  findings, radiographic studies, other laboratory data, and  cytogenetic/FISH results is recommended.   FISH no abnormalities.  RADIOGRAPHIC STUDIES: I have personally reviewed the radiological images as listed and agreed with the findings in the report. No results found.   All questions were answered. The patient knows to call the clinic with any problems, questions or concerns. I spent 30 minutes in the care of this patient including H and P, review of records, counseling, documentation and coordination of care.    Benay Pike, MD 02/19/2021 12:30 PM

## 2021-02-20 ENCOUNTER — Inpatient Hospital Stay: Payer: Medicare Other | Admitting: Hematology and Oncology

## 2021-02-20 ENCOUNTER — Other Ambulatory Visit: Payer: Self-pay | Admitting: Cardiology

## 2021-02-20 ENCOUNTER — Inpatient Hospital Stay: Payer: Medicare Other

## 2021-02-20 DIAGNOSIS — I10 Essential (primary) hypertension: Secondary | ICD-10-CM

## 2021-03-01 ENCOUNTER — Other Ambulatory Visit: Payer: Self-pay

## 2021-03-01 ENCOUNTER — Ambulatory Visit: Payer: Medicare Other | Admitting: Pharmacist

## 2021-03-01 DIAGNOSIS — Z5181 Encounter for therapeutic drug level monitoring: Secondary | ICD-10-CM

## 2021-03-01 DIAGNOSIS — Z7901 Long term (current) use of anticoagulants: Secondary | ICD-10-CM

## 2021-03-01 DIAGNOSIS — I4821 Permanent atrial fibrillation: Secondary | ICD-10-CM

## 2021-03-01 LAB — POCT INR: INR: 5.2 — AB (ref 2.0–3.0)

## 2021-03-01 NOTE — Progress Notes (Signed)
Anticoagulation Management Gregory [name] is a 75 y.o. male who reports to the clinic for monitoring of warfarin treatment.    Indication: atrial fibrillation CHA2DS2 Vasc Score 4 (Age 43-74, HTN hx, DM Hx, Vascular disease hx) , HAS-BLED 1 (age>65)  Duration: indefinite Supervising physician: Adrian Prows  Anticoagulation Clinic Visit History:  Patient does not report signs/symptoms of bleeding or thromboembolism   Other recent changes: No changes in diet, lifestyle.  Pt reports that he was started on bactrim for a 7 day course last Saturday for UTI. Expected end date of ~03/03/21. Denies hematuria, frequent/painful urination or dysuria. Pt has a f/u OV with PCP next month.   Pt reports that he already took his warfarin earlier this morning   Anticoagulation Episode Summary     Current INR goal:  2.0-3.0  TTR:  72.1 % (1.8 y)  Next INR check:  03/13/2021  INR from last check:  5.2 (03/01/2021)  Weekly max warfarin dose:    Target end date:  Indefinite  INR check location:    Preferred lab:    Send INR reminders to:     Indications   Atrial fibrillation (HCC) [I48.91] Monitoring for long-term anticoagulant use [Z51.81 Z79.01]        Comments:          No Known Allergies  Current Outpatient Medications:    enoxaparin (LOVENOX) 120 MG/0.8ML injection, Inject 0.8 mLs (120 mg total) into the skin every 12 (twelve) hours. (Patient not taking: Reported on 11/03/2020), Disp: 4 mL, Rfl: 1   allopurinol (ZYLOPRIM) 300 MG tablet, Take 300 mg by mouth daily., Disp: , Rfl:    amLODipine (NORVASC) 5 MG tablet, TAKE 1 TABLET BY MOUTH  DAILY, Disp: 90 tablet, Rfl: 3   atorvastatin (LIPITOR) 20 MG tablet, TAKE 1 TABLET BY MOUTH ONCE DAILY AT  6PM, Disp: 90 tablet, Rfl: 1   carvedilol (COREG) 25 MG tablet, Take 25 mg by mouth 2 (two) times daily., Disp: , Rfl:    ciprofloxacin (CIPRO) 500 MG tablet, Take 1 tablet by mouth every 12 (twelve) hours., Disp: , Rfl:    colchicine 0.6 MG tablet,  Take 1 tablet by mouth as needed. Take 2 tabs initially then take 1 tab in 1 hr for gout attack, Disp: , Rfl:    furosemide (LASIX) 20 MG tablet, TAKE 1 TABLET BY MOUTH ONCE DAILY IN THE MORNING, Disp: 90 tablet, Rfl: 0   glipiZIDE (GLUCOTROL) 5 MG tablet, Take 2.5 mg by mouth daily before breakfast., Disp: , Rfl:    hydrALAZINE (APRESOLINE) 100 MG tablet, Take 100 mg by mouth 2 (two) times daily. , Disp: , Rfl:    ONETOUCH VERIO test strip, 1 each daily., Disp: , Rfl:    spironolactone (ALDACTONE) 25 MG tablet, TAKE 1 TABLET BY MOUTH ONCE DAILY IN THE MORNING, Disp: 90 tablet, Rfl: 0   sulfamethoxazole-trimethoprim (BACTRIM DS) 800-160 MG tablet, Take 1 tablet by mouth in the morning and at bedtime., Disp: , Rfl:    Tamsulosin HCl (FLOMAX) 0.4 MG CAPS, Take 0.4 mg by mouth daily., Disp: , Rfl:    valsartan (DIOVAN) 80 MG tablet, Take 80 mg by mouth daily., Disp: , Rfl:    warfarin (COUMADIN) 5 MG tablet, TAKE 1 TABLET BY MOUTH ONCE DAILY (OR  AS  DIRECTED  BY  THE  COUMADIN  CLINIC), Disp: 90 tablet, Rfl: 0 Past Medical History:  Diagnosis Date   CAD (coronary artery disease)    COPD (chronic obstructive pulmonary disease) (  [place])    Diabetes mellitus    Heart attack ([place])    Hyperlipidemia    Hypertension    Neuromuscular disorder ([place])    SOB (shortness of breath) on exertion    ASSESSMENT  Recent Results: The most recent result is correlated with 32.5 mg per week:  Lab Results  Component Value Date   INR 5.2 (A) 03/01/2021   INR 3.2 (A) 02/08/2021   INR 2.9 01/29/2021    Anticoagulation Dosing: Description   INR above goal. HOLD tomorrow and Saturday, then continue current weekly dose of 2.5 mg every Wed and 5 mg all other days. Recheck INR in 2 weeks.      INR today: Supratherapeutic. In the setting of new start bactrim. Known increased anticoagulation effect due to DDI between warfarin and bactrim. Pt expected to finish course of Abx in 2 days. Denies any other relevant  changes in diet, medications, or lifestyles. Denies any bleeding or bruising symptoms. Will dose adjust and continue close monitoring.   PLAN Weekly dose was unchanged by 0% to 32.5 mg/week. HOLD tomorrow and Saturday, then continue current weekly dose of 2.5 mg every Wed and 5 mg all other days. Recheck INR in 2 weeks.   Patient Instructions  INR above goal. HOLD tomorrow and Saturday, then continue current weekly dose of 2.5 mg every Wed and 5 mg all other days. Recheck INR in 2 weeks.  Patient advised to contact clinic or seek medical attention if signs/symptoms of bleeding or thromboembolism occur.  Patient verbalized understanding by repeating back information and was advised to contact me if further medication-related questions arise.   Follow-up Return in about 12 days (around 03/13/2021).  Alysia Penna, PharmD  15 minutes spent face-to-face with the patient during the encounter. 50% of time spent on education, including signs/sx bleeding and clotting, as well as food and drug interactions with warfarin. 50% of time was spent on fingerprick POC INR sample collection,processing, results determination, and documentation

## 2021-03-01 NOTE — Patient Instructions (Signed)
INR above goal. HOLD tomorrow and Saturday, then continue current weekly dose of 2.5 mg every Wed and 5 mg all other days. Recheck INR in 2 weeks.

## 2021-03-08 ENCOUNTER — Other Ambulatory Visit: Payer: Self-pay | Admitting: Cardiology

## 2021-03-10 ENCOUNTER — Other Ambulatory Visit: Payer: Self-pay | Admitting: Cardiology

## 2021-03-13 ENCOUNTER — Other Ambulatory Visit: Payer: Self-pay

## 2021-03-13 ENCOUNTER — Ambulatory Visit: Payer: Medicare Other | Admitting: Pharmacist

## 2021-03-13 DIAGNOSIS — Z7901 Long term (current) use of anticoagulants: Secondary | ICD-10-CM

## 2021-03-13 DIAGNOSIS — I4821 Permanent atrial fibrillation: Secondary | ICD-10-CM

## 2021-03-13 DIAGNOSIS — Z5181 Encounter for therapeutic drug level monitoring: Secondary | ICD-10-CM

## 2021-03-13 LAB — POCT INR: INR: 2.5 (ref 2.0–3.0)

## 2021-03-13 NOTE — Patient Instructions (Signed)
INR at goal. Continue current weekly dose of 2.5 mg every Wed and 5 mg all other days. Recheck INR in 4 weeks.

## 2021-03-13 NOTE — Progress Notes (Signed)
Anticoagulation Management Gregory [name] is a 75 y.o. male who reports to the clinic for monitoring of warfarin treatment.    Indication: atrial fibrillation CHA2DS2 Vasc Score 4 (Age 76-74, HTN hx, DM Hx, Vascular disease hx) , HAS-BLED 1 (age>65)  Duration: indefinite Supervising physician: Adrian Prows  Anticoagulation Clinic Visit History:  Patient does not report signs/symptoms of bleeding or thromboembolism   Other recent changes: No changes in diet, lifestyle.  Reports finishing his course of ciprofloxacin last Friday. Recovering well post concerns of UTI infection. Denies any complains of hematuria.   Anticoagulation Episode Summary     Current INR goal:  2.0-3.0  TTR:  71.1 % (1.8 y)  Next INR check:  04/10/2021  INR from last check:  2.5 (03/13/2021)  Weekly max warfarin dose:    Target end date:  Indefinite  INR check location:    Preferred lab:    Send INR reminders to:     Indications   Atrial fibrillation (HCC) [I48.91] Monitoring for long-term anticoagulant use [Z51.81 Z79.01]        Comments:          No Known Allergies  Current Outpatient Medications:    enoxaparin (LOVENOX) 120 MG/0.8ML injection, Inject 0.8 mLs (120 mg total) into the skin every 12 (twelve) hours. (Patient not taking: Reported on 11/03/2020), Disp: 4 mL, Rfl: 1   allopurinol (ZYLOPRIM) 300 MG tablet, Take 300 mg by mouth daily., Disp: , Rfl:    amLODipine (NORVASC) 5 MG tablet, TAKE 1 TABLET BY MOUTH  DAILY, Disp: 90 tablet, Rfl: 3   atorvastatin (LIPITOR) 20 MG tablet, TAKE 1 TABLET BY MOUTH ONCE DAILY AT  6PM, Disp: 90 tablet, Rfl: 1   carvedilol (COREG) 25 MG tablet, Take 25 mg by mouth 2 (two) times daily., Disp: , Rfl:    ciprofloxacin (CIPRO) 500 MG tablet, Take 1 tablet by mouth every 12 (twelve) hours., Disp: , Rfl:    colchicine 0.6 MG tablet, Take 1 tablet by mouth as needed. Take 2 tabs initially then take 1 tab in 1 hr for gout attack, Disp: , Rfl:    furosemide (LASIX) 20 MG  tablet, TAKE 1 TABLET BY MOUTH ONCE DAILY IN THE MORNING, Disp: 90 tablet, Rfl: 0   glipiZIDE (GLUCOTROL) 5 MG tablet, Take 2.5 mg by mouth daily before breakfast., Disp: , Rfl:    hydrALAZINE (APRESOLINE) 100 MG tablet, Take 100 mg by mouth 2 (two) times daily. , Disp: , Rfl:    ONETOUCH VERIO test strip, 1 each daily., Disp: , Rfl:    spironolactone (ALDACTONE) 25 MG tablet, TAKE 1 TABLET BY MOUTH ONCE DAILY IN THE MORNING, Disp: 90 tablet, Rfl: 0   sulfamethoxazole-trimethoprim (BACTRIM DS) 800-160 MG tablet, Take 1 tablet by mouth in the morning and at bedtime., Disp: , Rfl:    Tamsulosin HCl (FLOMAX) 0.4 MG CAPS, Take 0.4 mg by mouth daily., Disp: , Rfl:    valsartan (DIOVAN) 80 MG tablet, Take 80 mg by mouth daily., Disp: , Rfl:    warfarin (COUMADIN) 5 MG tablet, TAKE 1 TABLET BY MOUTH ONCE DAILY (OR  AS  DIRECTED  BY  THE  COUMADIN  CLINIC), Disp: 90 tablet, Rfl: 0 Past Medical History:  Diagnosis Date   CAD (coronary artery disease)    COPD (chronic obstructive pulmonary disease) (HCC)    Diabetes mellitus    Heart attack ([place])    Hyperlipidemia    Hypertension    Neuromuscular disorder ([place])    SOB (  shortness of breath) on exertion    ASSESSMENT  Recent Results: The most recent result is correlated with 32.5 mg per week:  Lab Results  Component Value Date   INR 2.5 03/13/2021   INR 5.2 (A) 03/01/2021   INR 3.2 (A) 02/08/2021    Anticoagulation Dosing: Description   INR at goal. Continue current weekly dose of 2.5 mg every Wed and 5 mg all other days. Recheck INR in 4 weeks.      INR today: Therapeutic. Returns within therapeutic range after finishing his course of Abx. Denies any other relevant changes in diet, medications, or lifestyles. Denies any bleeding or bruising symptoms. Previously therapeutic on current weekly dose. Will continue current dose and continue monitoring.   PLAN Weekly dose was unchanged by 0% to 32.5 mg/week. Continue current weekly dose of 2.5  mg every Wed and 5 mg all other days. Recheck INR in 4 weeks.   Patient Instructions  INR at goal. Continue current weekly dose of 2.5 mg every Wed and 5 mg all other days. Recheck INR in 4 weeks.  Patient advised to contact clinic or seek medical attention if signs/symptoms of bleeding or thromboembolism occur.  Patient verbalized understanding by repeating back information and was advised to contact me if further medication-related questions arise.   Follow-up Return in about 4 weeks (around 04/10/2021).  Alysia Penna, PharmD  15 minutes spent face-to-face with the patient during the encounter. 50% of time spent on education, including signs/sx bleeding and clotting, as well as food and drug interactions with warfarin. 50% of time was spent on fingerprick POC INR sample collection,processing, results determination, and documentation

## 2021-04-03 ENCOUNTER — Ambulatory Visit: Payer: Medicare Other | Admitting: Pharmacist

## 2021-04-03 ENCOUNTER — Other Ambulatory Visit: Payer: Self-pay

## 2021-04-03 DIAGNOSIS — Z7901 Long term (current) use of anticoagulants: Secondary | ICD-10-CM

## 2021-04-03 DIAGNOSIS — I4821 Permanent atrial fibrillation: Secondary | ICD-10-CM

## 2021-04-03 DIAGNOSIS — Z5181 Encounter for therapeutic drug level monitoring: Secondary | ICD-10-CM

## 2021-04-03 LAB — POCT INR: INR: 3.1 — AB (ref 2.0–3.0)

## 2021-04-03 NOTE — Progress Notes (Signed)
Anticoagulation Management Gregory [name] is a 75 y.o. male who reports to the clinic for monitoring of warfarin treatment.    Indication: atrial fibrillation CHA2DS2 Vasc Score 4 (Age 78-74, HTN hx, DM Hx, Vascular disease hx) , HAS-BLED 1 (age>65)  Duration: indefinite Supervising physician: Adrian Prows  Anticoagulation Clinic Visit History:  Patient does not report signs/symptoms of bleeding or thromboembolism   Other recent changes: No changes in diet, lifestyle.  Pt was recently seen by his dentist for concerns of tooth infection. Was started on 7 day course of Keflex. Last dose this past weekend. Pt waiting for follow up tooth extraction at the end of the month.   Anticoagulation Episode Summary     Current INR goal:  2.0-3.0  TTR:  71.5 % (1.9 y)  Next INR check:  05/07/2021  INR from last check:  3.1 (04/03/2021)  Weekly max warfarin dose:    Target end date:  Indefinite  INR check location:    Preferred lab:    Send INR reminders to:     Indications   Atrial fibrillation (HCC) [I48.91] Monitoring for long-term anticoagulant use [Z51.81 Z79.01]        Comments:          No Known Allergies  Current Outpatient Medications:    dapagliflozin propanediol (FARXIGA) 10 MG TABS tablet, Take 10 mg by mouth daily., Disp: , Rfl:    enoxaparin (LOVENOX) 120 MG/0.8ML injection, Inject 0.8 mLs (120 mg total) into the skin every 12 (twelve) hours. (Patient not taking: Reported on 11/03/2020), Disp: 4 mL, Rfl: 1   allopurinol (ZYLOPRIM) 300 MG tablet, Take 300 mg by mouth daily., Disp: , Rfl:    amLODipine (NORVASC) 5 MG tablet, TAKE 1 TABLET BY MOUTH  DAILY, Disp: 90 tablet, Rfl: 3   atorvastatin (LIPITOR) 20 MG tablet, TAKE 1 TABLET BY MOUTH ONCE DAILY AT  6PM, Disp: 90 tablet, Rfl: 1   carvedilol (COREG) 25 MG tablet, Take 25 mg by mouth 2 (two) times daily., Disp: , Rfl:    ciprofloxacin (CIPRO) 500 MG tablet, Take 1 tablet by mouth every 12 (twelve) hours., Disp: , Rfl:     colchicine 0.6 MG tablet, Take 1 tablet by mouth as needed. Take 2 tabs initially then take 1 tab in 1 hr for gout attack, Disp: , Rfl:    furosemide (LASIX) 20 MG tablet, TAKE 1 TABLET BY MOUTH ONCE DAILY IN THE MORNING, Disp: 90 tablet, Rfl: 0   glipiZIDE (GLUCOTROL) 5 MG tablet, Take 2.5 mg by mouth daily before breakfast., Disp: , Rfl:    hydrALAZINE (APRESOLINE) 100 MG tablet, Take 100 mg by mouth 2 (two) times daily. , Disp: , Rfl:    ONETOUCH VERIO test strip, 1 each daily., Disp: , Rfl:    spironolactone (ALDACTONE) 25 MG tablet, TAKE 1 TABLET BY MOUTH ONCE DAILY IN THE MORNING, Disp: 90 tablet, Rfl: 0   sulfamethoxazole-trimethoprim (BACTRIM DS) 800-160 MG tablet, Take 1 tablet by mouth in the morning and at bedtime., Disp: , Rfl:    Tamsulosin HCl (FLOMAX) 0.4 MG CAPS, Take 0.4 mg by mouth daily., Disp: , Rfl:    valsartan (DIOVAN) 80 MG tablet, Take 80 mg by mouth daily., Disp: , Rfl:    warfarin (COUMADIN) 5 MG tablet, TAKE 1 TABLET BY MOUTH ONCE DAILY (OR  AS  DIRECTED  BY  THE  COUMADIN  CLINIC), Disp: 90 tablet, Rfl: 0 Past Medical History:  Diagnosis Date   CAD (coronary artery disease)  COPD (chronic obstructive pulmonary disease) (HCC)    Diabetes mellitus    Heart attack ([place])    Hyperlipidemia    Hypertension    Neuromuscular disorder (HCC)    SOB (shortness of breath) on exertion    ASSESSMENT  Recent Results: The most recent result is correlated with 32.5 mg per week:  Lab Results  Component Value Date   INR 3.1 (A) 04/03/2021   INR 2.5 03/13/2021   INR 5.2 (A) 03/01/2021    Anticoagulation Dosing: Description   INR slightly above goal. Continue current weekly dose of 2.5 mg every Wed and 5 mg all other days. Recheck INR in 5 weeks.      INR today: Supratherapeutic. Slightly above goal following finishing his course of Abx. Denies any other relevant changes in diet, medications, or lifestyles. Denies any bleeding or bruising symptoms. Previously  therapeutic on current weekly dose. Will continue current dose and continue monitoring.   PLAN Weekly dose was unchanged by 0% to 32.5 mg/week. Continue current weekly dose of 2.5 mg every Wed and 5 mg all other days. Recheck INR in 5 weeks.   Patient Instructions  INR slightly above goal. Continue current weekly dose of 2.5 mg every Wed and 5 mg all other days. Recheck INR in 5 weeks.  Patient advised to contact clinic or seek medical attention if signs/symptoms of bleeding or thromboembolism occur.  Patient verbalized understanding by repeating back information and was advised to contact me if further medication-related questions arise.   Follow-up Return in about 1 month (around 05/07/2021).  Alysia Penna, PharmD  15 minutes spent face-to-face with the patient during the encounter. 50% of time spent on education, including signs/sx bleeding and clotting, as well as food and drug interactions with warfarin. 50% of time was spent on fingerprick POC INR sample collection,processing, results determination, and documentation

## 2021-04-03 NOTE — Patient Instructions (Signed)
INR slightly above goal. Continue current weekly dose of 2.5 mg every Wed and 5 mg all other days. Recheck INR in 5 weeks.

## 2021-04-23 ENCOUNTER — Encounter: Payer: Self-pay | Admitting: Pharmacist

## 2021-04-23 NOTE — Progress Notes (Signed)
CARE PLAN ENTRY  04/23/2021 Name: Gregory [name] MRN: [id number] DOB: [birth year]  Gregory [name] is enrolled in Remote Patient Monitoring/Principle Care Monitoring.  Date of Enrollment: 11/04/19 Supervising physician: Adrian Prows Indication: CHF  Remote Readings:  Weight trending down and Compliant  Next scheduled OV: 05/07/21  Pharmacist Clinical Goal(s):  Over the next 90 days, patient will demonstrate Improved medication adherence as evidenced by medication fill history Over the next 90 days, patient will demonstrate improved understanding of prescribed medications and rationale for usage as evidenced by patient teach back Over the next 90 days, patient will experience decrease in ED visits. ED visits in last 6 months = 0 Over the next 90 days, patient will not experience hospital admission. Hospital Admissions in last 6 months = 0  Interventions: Provider and Inter-disciplinary care team collaboration (see longitudinal plan of care) Comprehensive medication review performed. Discussed plans with patient for ongoing care management follow up and provided patient with direct contact information for care management team Collaboration with provider re: medication management  Patient Self Care Activities:  Self administers medications as prescribed Attends all scheduled provider appointments Performs ADL's independently Performs IADL's independently  No Known Allergies Outpatient Encounter Medications as of 04/23/2021  Medication Sig   enoxaparin (LOVENOX) 120 MG/0.8ML injection Inject 0.8 mLs (120 mg total) into the skin every 12 (twelve) hours. (Patient not taking: Reported on 11/03/2020)   allopurinol (ZYLOPRIM) 300 MG tablet Take 300 mg by mouth daily.   amLODipine (NORVASC) 5 MG tablet TAKE 1 TABLET BY MOUTH  DAILY   atorvastatin (LIPITOR) 20 MG tablet TAKE 1 TABLET BY MOUTH ONCE DAILY AT  6PM   carvedilol (COREG) 25 MG tablet Take 25 mg by mouth 2 (two) times daily.    ciprofloxacin (CIPRO) 500 MG tablet Take 1 tablet by mouth every 12 (twelve) hours.   colchicine 0.6 MG tablet Take 1 tablet by mouth as needed. Take 2 tabs initially then take 1 tab in 1 hr for gout attack   dapagliflozin propanediol (FARXIGA) 10 MG TABS tablet Take 10 mg by mouth daily.   furosemide (LASIX) 20 MG tablet TAKE 1 TABLET BY MOUTH ONCE DAILY IN THE MORNING   glipiZIDE (GLUCOTROL) 5 MG tablet Take 2.5 mg by mouth daily before breakfast.   hydrALAZINE (APRESOLINE) 100 MG tablet Take 100 mg by mouth 2 (two) times daily.    ONETOUCH VERIO test strip 1 each daily.   spironolactone (ALDACTONE) 25 MG tablet TAKE 1 TABLET BY MOUTH ONCE DAILY IN THE MORNING   sulfamethoxazole-trimethoprim (BACTRIM DS) 800-160 MG tablet Take 1 tablet by mouth in the morning and at bedtime.   Tamsulosin HCl (FLOMAX) 0.4 MG CAPS Take 0.4 mg by mouth daily.   valsartan (DIOVAN) 80 MG tablet Take 80 mg by mouth daily.   warfarin (COUMADIN) 5 MG tablet TAKE 1 TABLET BY MOUTH ONCE DAILY (OR  AS  DIRECTED  BY  THE  COUMADIN  CLINIC)   No facility-administered encounter medications on file as of 04/23/2021.      Heart Failure   Type: Systolic  Last ejection fraction: 40-45% NYHA Class: II (slight limitation of activity) AHA HF Stage: B (Heart disease present - no symptoms present)  Patient is currently controlled on the following medications: Farxiga 10 mg, amlodipine 5 mg, lasix 20 mg, spironolactone 25 mg, valsartan 80 mg, hydralazine 100 mg BID, varvedilol 25 mg BID.   We discussed diet and exercise extensively and weighing daily; if you gain more than 3 pounds  in one day or 5 pounds in one week call your doctor  Plan  Continue current medications and control with diet and exercise ______________ Visit Information SDOH (Social Determinants of Health) assessments performed: Yes.  Mr. Novello was given information about Principle Care Management/Remote Patient Monitoring services today including:   RPM/PCM service includes personalized support from designated clinical staff supervised by his physician, including individualized plan of care and coordination with other care providers 24/7 contact phone numbers for assistance for urgent and routine care needs. Standard insurance, coinsurance, copays and deductibles apply for principle care management only during months in which we provide at least 30 minutes of these services. Most insurances cover these services at 100%, however patients may be responsible for any copay, coinsurance and/or deductible if applicable. This service may help you avoid the need for more expensive face-to-face services. Only one practitioner may furnish and bill the service in a calendar month. The patient may stop PCM/RPM services at any time (effective at the end of the month) by phone call to the office staff.  Patient agreed to services and verbal consent obtained.   [name], Pharm.D. [facility] [phone] Ext: 120

## 2021-05-02 ENCOUNTER — Other Ambulatory Visit: Payer: Self-pay | Admitting: Student

## 2021-05-02 DIAGNOSIS — I4821 Permanent atrial fibrillation: Secondary | ICD-10-CM

## 2021-05-02 DIAGNOSIS — Z5181 Encounter for therapeutic drug level monitoring: Secondary | ICD-10-CM

## 2021-05-02 NOTE — Telephone Encounter (Signed)
Refill

## 2021-05-07 ENCOUNTER — Other Ambulatory Visit: Payer: Self-pay

## 2021-05-07 ENCOUNTER — Encounter: Payer: Self-pay | Admitting: Student

## 2021-05-07 ENCOUNTER — Ambulatory Visit: Payer: Medicare Other | Admitting: Student

## 2021-05-07 VITALS — BP 122/72 | HR 92 | Resp 16 | Ht 71.0 in | Wt 260.4 lb

## 2021-05-07 DIAGNOSIS — Z7901 Long term (current) use of anticoagulants: Secondary | ICD-10-CM

## 2021-05-07 DIAGNOSIS — I4821 Permanent atrial fibrillation: Secondary | ICD-10-CM

## 2021-05-07 DIAGNOSIS — Z5181 Encounter for therapeutic drug level monitoring: Secondary | ICD-10-CM

## 2021-05-07 DIAGNOSIS — I10 Essential (primary) hypertension: Secondary | ICD-10-CM

## 2021-05-07 LAB — POCT INR: INR: 4.1 — AB (ref 2.0–3.0)

## 2021-05-07 NOTE — Patient Instructions (Signed)
INR above goal. HOLD today, then continue current weekly dose of 2.5 mg every Wed and 5 mg all other days. Recheck INR in 3 weeks.

## 2021-05-07 NOTE — Progress Notes (Signed)
Primary Physician/Referring:  Sonia Side., FNP  Patient ID: Gregory [name], male    DOB: [birth year], 75 y.o.   MRN: [id number]  Chief Complaint  Patient presents with   Atrial Fibrillation   Follow-up   HPI:    Gregory [name]  is a 75 y.o. 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   Patient presents for 50-monthfollow-up.  At last office visit patient was asymptomatic and risk factors were well controlled, therefore no changes were made.  Patient remains relatively asymptomatic.  Denies chest pain, dyspnea, dizziness, syncope, near syncope.  Denies orthopnea, PND.  Gregory [name] does have chronic bilateral lower leg edema for which she wears compression stockings, this remained stable.  Gregory [name] is tolerating anticoagulation without bleeding diathesis.  Patient is currently scheduled to see his PCP for regular follow-up later this week.  I have requested the patient have lab results from this upcoming visit sent to our office for review.  Past Medical History:  Diagnosis Date   CAD (coronary artery disease)    COPD (chronic obstructive pulmonary disease) ([facility]    Diabetes mellitus    Heart attack ([place]    Hyperlipidemia    Hypertension    Neuromuscular disorder (HCC)    SOB (shortness of breath) on exertion    Past Surgical History:  Procedure Laterality Date   BACK SURGERY     CARDIAC CATHETERIZATION     CORONARY STENT PLACEMENT     KNEE ARTHROPLASTY     LEFT HEART CATH AND CORONARY ANGIOGRAPHY N/A 03/19/2019   Procedure: LEFT HEART CATH AND CORONARY ANGIOGRAPHY;  Surgeon: PNigel Mormon MD;  [facility];  Service: Cardiovascular;  Laterality: N/A;   LEFT HEART CATHETERIZATION WITH CORONARY ANGIOGRAM N/A 03/03/2012    Procedure: LEFT HEART CATHETERIZATION WITH CORONARY ANGIOGRAM;  Surgeon: JLaverda Page MD;  [facility];  Service: Cardiovascular;  Laterality: N/A;   PERCUTANEOUS CORONARY STENT INTERVENTION (PCI-S)  03/03/2012   Procedure: PERCUTANEOUS CORONARY STENT INTERVENTION (PCI-S);  Surgeon: JLaverda Page MD;  [facility];  Service: Cardiovascular;;   Family History  Problem Relation Age of Onset   Hypertension Father    Hyperlipidemia Father    Stroke Father    Hypertension Paternal Grandfather    Hyperlipidemia Paternal Grandfather    Diabetes Other        maternal uncles   Diabetes Sister    Cancer Mother    Colon cancer Neg Hx    Social History   Tobacco Use   Smoking status: Former    Packs/day: 1.00    Years: 30.00    Pack years: 30.00    Types: Cigarettes    Quit date: 07/30/2003    Years since quitting: 17.7   Smokeless tobacco: Never  Substance Use Topics   Alcohol use: Yes    Alcohol/week: 0.0 standard drinks    Comment: wine rarely    Marital Status: Married  ROS  Review of Systems  Constitutional: Negative for malaise/fatigue and weight gain.  Cardiovascular:  Positive for dyspnea on exertion (chronic and stable) and palpitations (stable). Negative for chest pain, claudication, leg swelling, near-syncope, orthopnea, paroxysmal nocturnal dyspnea and syncope.  Respiratory:  Negative for shortness of breath.   Musculoskeletal:  Positive for back pain (chronic  and 3 back operations).  Neurological:  Positive for paresthesias (fingers).  Objective   Vitals with BMI 05/07/2021 11/03/2020 08/21/2020  Height 5' 11"  5' 11"  -  Weight 260 lbs 6 oz 261 lbs -  BMI 63.78 58.85 -  Systolic 027 741 -  Diastolic 72 80 -  Pulse 92 88 44    Blood pressure 122/72, pulse 92, resp. rate 16, height 5' 11"  (1.803 m), weight 260 lb 6.4 oz (118.1 kg), SpO2 96 %. Body mass index is 36.32 kg/m.   Physical Exam Vitals reviewed.  Constitutional:       Appearance: Gregory [name] is obese.     Comments: Gregory [name] is well-built and moderately obese in no acute distress.  Cardiovascular:     Rate and Rhythm: Normal rate. Rhythm irregular.     Pulses: Normal pulses and intact distal pulses.     Heart sounds: No murmur heard.   No gallop. No S3 or S4 sounds.     Comments: No JVD.   Pulmonary:     Effort: Pulmonary effort is normal.     Breath sounds: Normal breath sounds.  Abdominal:     Comments: Obese.  Musculoskeletal:     Right lower leg: Edema (trace) present.     Left lower leg: Edema (trace) present.  Neurological:     General: No focal deficit present.     Mental Status: Gregory [name] is alert and oriented to person, place, and time.  Physical exam unchanged compared to previous office visit.  Laboratory examination:   Recent Labs    06/01/20 1158  NA 140  K 3.8  CL 104  CO2 26  GLUCOSE 92  BUN 16  CREATININE 1.79*  CALCIUM 9.8  GFRNONAA 39*   CMP Latest Ref Rng & Units 06/01/2020 09/13/2019 09/09/2019  Glucose 70 - 99 mg/dL 92 92 131(H)  BUN 8 - 23 mg/dL 16 29(H) 14  Creatinine 0.61 - 1.24 mg/dL 1.79(H) 1.97(H) 1.73(H)  Sodium 135 - 145 mmol/L 140 141 139  Potassium 3.5 - 5.1 mmol/L 3.8 3.7 4.2  Chloride 98 - 111 mmol/L 104 100 102  CO2 22 - 32 mmol/L 26 26 24   Calcium 8.9 - 10.3 mg/dL 9.8 9.3 9.4  Total Protein 6.5 - 8.1 g/dL 8.2(H) - 7.6  Total Bilirubin 0.3 - 1.2 mg/dL 0.8 - 2.1(H)  Alkaline Phos 38 - 126 U/L 126 - 96  AST 15 - 41 U/L 11(L) - 17  ALT 0 - 44 U/L 9 - 19   CBC Latest Ref Rng & Units 08/10/2020 07/26/2020 06/01/2020  WBC 4.0 - 10.5 K/uL 5.6 6.5 6.3  Hemoglobin 13.0 - 17.0 g/dL 12.2(L) 13.5 12.7(L)  Hematocrit 39.0 - 52.0 % 38.3(L) 42.1 39.8  Platelets 150 - 400 K/uL 196 235 204   Lipid Panel No results for input(s): CHOL, TRIG, LDLCALC, VLDL, HDL, CHOLHDL, LDLDIRECT in the last 8760 hours.  HEMOGLOBIN A1C Lab Results  Component Value Date   HGBA1C 6.0 (H) 03/18/2019   MPG 125.5 03/18/2019   BNP (last 3 results) No  results for input(s): BNP in the last 8760 hours.  External Labs:   Lab Results  Component Value Date   INR 4.1 (A) 05/07/2021   INR 3.1 (A) 04/03/2021   INR 2.5 03/13/2021   External labs: Hemoglobin 13.500 g/d 07/26/2020 Platelets 235.000 K/ 07/26/2020  Creatinine, Serum 1.790 mg/ 06/01/2020 Potassium 3.800 mm 06/01/2020 ALT (SGPT) 9.000 U/L 06/01/2020 T SH 4.350 08/23/2019  Lab 01/17/2020:  Serum glucose 109 mg, BUN  55, creatinine 2.6, EGFR 29.3 mL, sodium 140, potassium 4.8.  PSA 0.777 ng/ 08/23/2019  Allergies  No Known Allergies    Medications Prior to Visit:   Outpatient Medications Prior to Visit  Medication Sig Dispense Refill   allopurinol (ZYLOPRIM) 300 MG tablet Take 300 mg by mouth daily.     amLODipine (NORVASC) 5 MG tablet TAKE 1 TABLET BY MOUTH  DAILY 90 tablet 3   atorvastatin (LIPITOR) 20 MG tablet TAKE 1 TABLET BY MOUTH ONCE DAILY AT  6PM 90 tablet 1   carvedilol (COREG) 25 MG tablet Take 25 mg by mouth 2 (two) times daily.     colchicine 0.6 MG tablet Take 1 tablet by mouth as needed. Take 2 tabs initially then take 1 tab in 1 hr for gout attack     furosemide (LASIX) 20 MG tablet TAKE 1 TABLET BY MOUTH ONCE DAILY IN THE MORNING 90 tablet 0   glipiZIDE (GLUCOTROL) 5 MG tablet Take 2.5 mg by mouth daily before breakfast.     hydrALAZINE (APRESOLINE) 100 MG tablet Take 100 mg by mouth 2 (two) times daily.      metFORMIN (GLUCOPHAGE) 500 MG tablet Take by mouth daily.     ONETOUCH VERIO test strip 1 each daily.     spironolactone (ALDACTONE) 25 MG tablet TAKE 1 TABLET BY MOUTH ONCE DAILY IN THE MORNING 90 tablet 0   Tamsulosin HCl (FLOMAX) 0.4 MG CAPS Take 0.4 mg by mouth daily.     valsartan (DIOVAN) 80 MG tablet Take 80 mg by mouth daily.     warfarin (COUMADIN) 5 MG tablet Take 1 tablet (5 mg total) by mouth daily. Or as directed by coumadin clinic 100 tablet 2   dapagliflozin propanediol (FARXIGA) 10 MG TABS tablet Take 10 mg by mouth daily.     enoxaparin  (LOVENOX) 120 MG/0.8ML injection Inject 0.8 mLs (120 mg total) into the skin every 12 (twelve) hours. (Patient not taking: Reported on 11/03/2020) 4 mL 1   No facility-administered medications prior to visit.     Final Medications at End of Visit    Current Meds  Medication Sig   allopurinol (ZYLOPRIM) 300 MG tablet Take 300 mg by mouth daily.   amLODipine (NORVASC) 5 MG tablet TAKE 1 TABLET BY MOUTH  DAILY   atorvastatin (LIPITOR) 20 MG tablet TAKE 1 TABLET BY MOUTH ONCE DAILY AT  6PM   carvedilol (COREG) 25 MG tablet Take 25 mg by mouth 2 (two) times daily.   colchicine 0.6 MG tablet Take 1 tablet by mouth as needed. Take 2 tabs initially then take 1 tab in 1 hr for gout attack   furosemide (LASIX) 20 MG tablet TAKE 1 TABLET BY MOUTH ONCE DAILY IN THE MORNING   glipiZIDE (GLUCOTROL) 5 MG tablet Take 2.5 mg by mouth daily before breakfast.   hydrALAZINE (APRESOLINE) 100 MG tablet Take 100 mg by mouth 2 (two) times daily.    metFORMIN (GLUCOPHAGE) 500 MG tablet Take by mouth daily.   ONETOUCH VERIO test strip 1 each daily.   spironolactone (ALDACTONE) 25 MG tablet TAKE 1 TABLET BY MOUTH ONCE DAILY IN THE MORNING   Tamsulosin HCl (FLOMAX) 0.4 MG CAPS Take 0.4 mg by mouth daily.   valsartan (DIOVAN) 80 MG tablet Take 80 mg by mouth daily.   warfarin (COUMADIN) 5 MG tablet Take 1 tablet (5 mg total) by mouth daily. Or as directed by coumadin clinic   Radiology:   No results found.  Cardiac  Studies:   Coronary Angiography 03/19/2019:  No change from 03/06/2012: LM: Normal LAD: Mid LAD focal 40% stenosis          Ostial diag-2 80% stenosis with TIMI III flow. Unchanged compared to 2013 angiography. Patent mid Diag 2 stent without ISR LCx: Patent mid LCX overlapping stents with 10-20% late lumen loss RCA: CTO Mid 100% occlusion with grade 3 left-to-right collaterals.   Echocardiogram 09/22/2019:  Left ventricle cavity is normal in size. Mild concentric hypertrophy of the left  ventricle. Normal global wall motion. Mildly depressed LV systolic function with visual EF 40-45%. Could not evaluate diastolic function, patient probably in Afib.  Left atrial cavity is severely dilated. LA vol index 67 ml/m2. Trileaflet aortic valve. Trace aortic stenosis. Trace aortic regurgitation. Mild aortic valve leaflet calcification.  Moderate (Grade II) mitral regurgitation.  Moderate tricuspid regurgitation. Moderate pulmonary hypertension.  Estimated pulmonary artery systolic pressure is 50 mmHg. Estimated RA pressure 10-15 mmHg.  Compared to previous study on 03/19/2019, there is significant increase in PASP from 30 mmHg.    EKG  05/07/2021: Atrial fibrillation with controlled ventricular response and with PVCs at a rate of 89 bpm.  Normal axis.  Nonspecific T wave abnormality.  Cannot exclude anteroseptal infarct old.  EKG 11/05/2020: Sinus rhythm with frequent PVCs in bigeminal pattern at a rate of 94 bpm.   EKG 08/02/2020: Atrial fibrillation with controlled ventricular response at rate of 75 beats minute, normal axis. Nonspecific T abnormality.  Single PVC.  Low-voltage complexes.  No significant change from EKG 11/04/2019   Assessment      ICD-10-CM   1. Permanent atrial fibrillation (HCC)  I48.21 EKG 12-Lead    2. Monitoring for long-term anticoagulant use  Z51.81    Z79.01     3. Essential hypertension  I10      CHA2DS2-VASc Score is 5.  Yearly risk of stroke: 7.2% (A, HTN, CAD & CHF).  Score of 1=0.6; 2=2.2; 3=3.2; 4=4.8; 5=7.2; 6=9.8; 7=>9.8) -(CHF; HTN; vasc disease DM,  Male = 1; Age <65 =0; 65-74 = 1,  >75 =2; stroke/embolism= 2).     No orders of the defined types were placed in this encounter.  Medications Discontinued During This Encounter  Medication Reason   dapagliflozin propanediol (FARXIGA) 10 MG TABS tablet Error   enoxaparin (LOVENOX) 120 MG/0.8ML injection Error   Recommendations:   Gregory [name]  is a 75 y.o.  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   Patient is in Remote Patient Monitoring and Principal Care Management as patient is high risk for hospitalization and complications from underlying medical conditions.   Patient presents for 60-monthfollow-up.  At last office visit patient was asymptomatic and risk factors were well controlled, therefore no changes were made.  Patient is presently in atrial fibrillation, remains well rate controlled.  Gregory [name] is tolerating anticoagulation without bleeding diathesis.  Blood pressure is well controlled.  Will not make changes to patient's medications at this time.  Gregory [name] continues to follow with our office anticoagulation clinic for management of warfarin.  Will obtain most recent lipid profile testing from PCP.  Patient is otherwise stable from a cardiovascular standpoint.  Follow-up in 1 year, sooner if needed, for A. fib, hyperlipidemia, and CAD.  Gregory Berthold, PA-C 05/07/2021, 1:42 PM Office: [phone]

## 2021-05-25 ENCOUNTER — Other Ambulatory Visit: Payer: Self-pay | Admitting: Student

## 2021-05-25 DIAGNOSIS — I10 Essential (primary) hypertension: Secondary | ICD-10-CM

## 2021-05-28 ENCOUNTER — Ambulatory Visit: Payer: Medicare Other | Admitting: Pharmacist

## 2021-05-28 ENCOUNTER — Encounter: Payer: Self-pay | Admitting: Pharmacist

## 2021-05-28 ENCOUNTER — Other Ambulatory Visit: Payer: Self-pay

## 2021-05-28 DIAGNOSIS — I4821 Permanent atrial fibrillation: Secondary | ICD-10-CM

## 2021-05-28 DIAGNOSIS — Z7901 Long term (current) use of anticoagulants: Secondary | ICD-10-CM

## 2021-05-28 DIAGNOSIS — Z5181 Encounter for therapeutic drug level monitoring: Secondary | ICD-10-CM

## 2021-05-28 LAB — POCT INR: INR: 3.1 — AB (ref 2.0–3.0)

## 2021-05-28 NOTE — Patient Instructions (Signed)
INR above goal. Continue current weekly dose of 2.5 mg every Wed and 5 mg all other days. Recheck INR in 4 weeks.

## 2021-05-28 NOTE — Progress Notes (Signed)
Anticoagulation Management Gregory [name] is a 75 y.o. male who reports to the clinic for monitoring of warfarin treatment.    Indication: atrial fibrillation CHA2DS2 Vasc Score 4 (Age 59-74, HTN hx, DM Hx, Vascular disease hx) , HAS-BLED 1 (age>65)  Duration: indefinite Supervising physician: Adrian Prows  Anticoagulation Clinic Visit History:  Patient does not report signs/symptoms of bleeding or thromboembolism   Other recent changes: No changes in diet, lifestyle.  Pt planning on staying consistent with 2-3 servings of Vit K servings/week   Anticoagulation Episode Summary     Current INR goal:  2.0-3.0  TTR:  66.1 % (2 y)  Next INR check:  06/29/2021  INR from last check:  3.1 (05/28/2021)  Weekly max warfarin dose:    Target end date:  Indefinite  INR check location:    Preferred lab:    Send INR reminders to:     Indications   Atrial fibrillation (HCC) [I48.91] Monitoring for long-term anticoagulant use [Z51.81 Z79.01]        Comments:          No Known Allergies  Current Outpatient Medications:    allopurinol (ZYLOPRIM) 300 MG tablet, Take 300 mg by mouth daily., Disp: , Rfl:    amLODipine (NORVASC) 5 MG tablet, TAKE 1 TABLET BY MOUTH  DAILY, Disp: 90 tablet, Rfl: 3   atorvastatin (LIPITOR) 20 MG tablet, TAKE 1 TABLET BY MOUTH ONCE DAILY AT  6PM, Disp: 90 tablet, Rfl: 1   carvedilol (COREG) 25 MG tablet, Take 25 mg by mouth 2 (two) times daily., Disp: , Rfl:    colchicine 0.6 MG tablet, Take 1 tablet by mouth as needed. Take 2 tabs initially then take 1 tab in 1 hr for gout attack, Disp: , Rfl:    furosemide (LASIX) 20 MG tablet, TAKE 1 TABLET BY MOUTH ONCE DAILY IN THE MORNING, Disp: 90 tablet, Rfl: 0   glipiZIDE (GLUCOTROL) 5 MG tablet, Take 2.5 mg by mouth daily before breakfast., Disp: , Rfl:    hydrALAZINE (APRESOLINE) 100 MG tablet, Take 100 mg by mouth 2 (two) times daily. , Disp: , Rfl:    metFORMIN (GLUCOPHAGE) 500 MG tablet, Take by mouth daily., Disp: ,  Rfl:    ONETOUCH VERIO test strip, 1 each daily., Disp: , Rfl:    spironolactone (ALDACTONE) 25 MG tablet, TAKE 1 TABLET BY MOUTH ONCE DAILY IN THE MORNING, Disp: 90 tablet, Rfl: 0   Tamsulosin HCl (FLOMAX) 0.4 MG CAPS, Take 0.4 mg by mouth daily., Disp: , Rfl:    valsartan (DIOVAN) 80 MG tablet, Take 80 mg by mouth daily., Disp: , Rfl:    warfarin (COUMADIN) 5 MG tablet, Take 1 tablet (5 mg total) by mouth daily. Or as directed by coumadin clinic, Disp: 100 tablet, Rfl: 2 Past Medical History:  Diagnosis Date   CAD (coronary artery disease)    COPD (chronic obstructive pulmonary disease) ([place])    Diabetes mellitus    Heart attack ([place])    Hyperlipidemia    Hypertension    Neuromuscular disorder ([place])    SOB (shortness of breath) on exertion    ASSESSMENT  Recent Results: The most recent result is correlated with 32.5 mg per week:  Lab Results  Component Value Date   INR 3.1 (A) 05/28/2021   INR 4.1 (A) 05/07/2021   INR 3.1 (A) 04/03/2021    Anticoagulation Dosing: Description   INR above goal. Continue current weekly dose of 2.5 mg every Wed and 5 mg all  other days. Recheck INR in 4 weeks.      INR today: Supratherapeutic. Slightly above goal. Pt notes lower than previously stable Vit K intake. Denies any other relevant changes in diet, medications, or lifestyles. Denies any bleeding or bruising symptoms. Previously therapeutic on current weekly dose. Will continue current dose and continue monitoring.   PLAN Weekly dose was unchanged by 0% to 32.5 mg/week. Continue current weekly dose of 2.5 mg every Wed and 5 mg all other days. Recheck INR in 4 weeks.   Patient Instructions  INR above goal. Continue current weekly dose of 2.5 mg every Wed and 5 mg all other days. Recheck INR in 4 weeks.  Patient advised to contact clinic or seek medical attention if signs/symptoms of bleeding or thromboembolism occur.  Patient verbalized understanding by repeating back information and  was advised to contact me if further medication-related questions arise.   Follow-up Return in about 1 month (around 06/29/2021).  Alysia Penna, PharmD  15 minutes spent face-to-face with the patient during the encounter. 50% of time spent on education, including signs/sx bleeding and clotting, as well as food and drug interactions with warfarin. 50% of time was spent on fingerprick POC INR sample collection,processing, results determination, and documentation

## 2021-05-28 NOTE — Progress Notes (Signed)
This encounter was created in error - please disregard.

## 2021-06-02 ENCOUNTER — Other Ambulatory Visit: Payer: Self-pay | Admitting: Cardiology

## 2021-06-04 ENCOUNTER — Telehealth: Payer: Self-pay | Admitting: Hematology and Oncology

## 2021-06-04 NOTE — Telephone Encounter (Signed)
Scheduled per sch msg. Called and spoke with patient. Confirmed appt  

## 2021-06-26 ENCOUNTER — Telehealth: Payer: Self-pay | Admitting: Hematology and Oncology

## 2021-06-26 NOTE — Telephone Encounter (Signed)
Rescheduled 01/04 appointment to 01/31 per patient's request. Patient is notified of upcoming appointments.

## 2021-06-27 ENCOUNTER — Ambulatory Visit: Payer: Medicare Other | Admitting: Hematology and Oncology

## 2021-06-27 ENCOUNTER — Other Ambulatory Visit: Payer: Medicare Other

## 2021-06-29 ENCOUNTER — Ambulatory Visit: Payer: Medicare Other | Admitting: Pharmacist

## 2021-06-29 ENCOUNTER — Other Ambulatory Visit: Payer: Self-pay

## 2021-06-29 DIAGNOSIS — I4821 Permanent atrial fibrillation: Secondary | ICD-10-CM

## 2021-06-29 DIAGNOSIS — Z7901 Long term (current) use of anticoagulants: Secondary | ICD-10-CM

## 2021-06-29 LAB — POCT INR: INR: 3.6 — AB (ref 2.0–3.0)

## 2021-06-29 NOTE — Patient Instructions (Signed)
INR above goal. Take 2.5 mg today and then continue current weekly dose of 2.5 mg Wed, 5 mg all other days. Recheck INR 3 weeks.

## 2021-06-29 NOTE — Progress Notes (Signed)
Anticoagulation Management Gregory [name] is a 76 y.o. male who reports to the clinic for monitoring of warfarin treatment.    Indication: atrial fibrillation CHA2DS2 Vasc Score 4 (Age 72-74, HTN hx, DM Hx, Vascular disease hx) , HAS-BLED 1 (age>65)  Duration: indefinite Supervising physician: Adrian Prows  Anticoagulation Clinic Visit History:  Patient does not report signs/symptoms of bleeding or thromboembolism   Other recent changes: No changes in diet, lifestyle.  Pt planning on staying consistent with 2-3 servings of Vit K servings/week. Pt reports that he he might have lower than previous baseline Vit K intake over the past week. Planing on returning to previous baseline.   Pt reports that he recently has a steroid injections in his hip this week. Recovering well and getting good response.   Recent concerns of UTI. Had urinary lab work done with PCP. Hasnt been told results yet and nether has been started on Abx course.   Anticoagulation Episode Summary     Current INR goal:  2.0-3.0  TTR:  63.3 % (2.1 y)  Next INR check:  07/20/2021  INR from last check:  3.6 (06/29/2021)  Weekly max warfarin dose:    Target end date:  Indefinite  INR check location:    Preferred lab:    Send INR reminders to:     Indications   Atrial fibrillation (HCC) [I48.91] Monitoring for long-term anticoagulant use [Z51.81 Z79.01]        Comments:          No Known Allergies  Current Outpatient Medications:    allopurinol (ZYLOPRIM) 300 MG tablet, Take 300 mg by mouth daily., Disp: , Rfl:    amLODipine (NORVASC) 5 MG tablet, TAKE 1 TABLET BY MOUTH  DAILY, Disp: 90 tablet, Rfl: 3   atorvastatin (LIPITOR) 20 MG tablet, TAKE 1 TABLET BY MOUTH ONCE DAILY AT  6PM, Disp: 90 tablet, Rfl: 1   carvedilol (COREG) 25 MG tablet, Take 25 mg by mouth 2 (two) times daily., Disp: , Rfl:    colchicine 0.6 MG tablet, Take 1 tablet by mouth as needed. Take 2 tabs initially then take 1 tab in 1 hr for gout  attack, Disp: , Rfl:    furosemide (LASIX) 20 MG tablet, TAKE 1 TABLET BY MOUTH ONCE DAILY IN THE MORNING, Disp: 90 tablet, Rfl: 0   glipiZIDE (GLUCOTROL) 5 MG tablet, Take 2.5 mg by mouth daily before breakfast., Disp: , Rfl:    hydrALAZINE (APRESOLINE) 100 MG tablet, Take 100 mg by mouth 2 (two) times daily. , Disp: , Rfl:    ONETOUCH VERIO test strip, 1 each daily., Disp: , Rfl:    spironolactone (ALDACTONE) 25 MG tablet, TAKE 1 TABLET BY MOUTH ONCE DAILY IN THE MORNING, Disp: 90 tablet, Rfl: 0   Tamsulosin HCl (FLOMAX) 0.4 MG CAPS, Take 0.4 mg by mouth daily., Disp: , Rfl:    valsartan (DIOVAN) 80 MG tablet, Take 80 mg by mouth daily., Disp: , Rfl:    warfarin (COUMADIN) 5 MG tablet, Take 1 tablet (5 mg total) by mouth daily. Or as directed by coumadin clinic, Disp: 100 tablet, Rfl: 2 Past Medical History:  Diagnosis Date   CAD (coronary artery disease)    COPD (chronic obstructive pulmonary disease) ([place])    Diabetes mellitus    Heart attack ([place])    Hyperlipidemia    Hypertension    Neuromuscular disorder ([place])    SOB (shortness of breath) on exertion    ASSESSMENT  Recent Results: The most recent  result is correlated with 32.5 mg per week:  Lab Results  Component Value Date   INR 3.6 (A) 06/29/2021   INR 3.1 (A) 05/28/2021   INR 4.1 (A) 05/07/2021    Anticoagulation Dosing: Description   INR above goal. Take 2.5 mg today and then continue current weekly dose of 2.5 mg Wed, 5 mg all other days. Recheck INR 3 weeks.     INR today: Supratherapeutic. Slightly above goal. INR continues to remain above therapeutic range. Recent steroid injection and decreased Vit K intake could be contributing the the transient supratheraputic INR. Recent weekly dose decrease. Pt planing on returning ot previous baseline Vit K intake of 3 servings/week. Denies any other relevant changes in diet, medications, or lifestyles. Denies any bleeding or bruising symptoms. Will dose adjust and continue  close monitoring. If INR continues to remain supratheraputic, will consider weekly dose decrease at next check.   PLAN Weekly dose was unchanged by 0% to 32.5 mg/week. Continue current weekly dose of 2.5 mg every Wed and 5 mg all other days. Recheck INR in 4 weeks.   Patient Instructions  INR above goal. Take 2.5 mg today and then continue current weekly dose of 2.5 mg Wed, 5 mg all other days. Recheck INR 3 weeks. Patient advised to contact clinic or seek medical attention if signs/symptoms of bleeding or thromboembolism occur.  Patient verbalized understanding by repeating back information and was advised to contact me if further medication-related questions arise.   Follow-up Return in about 3 weeks (around 07/20/2021).  Alysia Penna, PharmD  15 minutes spent face-to-face with the patient during the encounter. 50% of time spent on education, including signs/sx bleeding and clotting, as well as food and drug interactions with warfarin. 50% of time was spent on fingerprick POC INR sample collection,processing, results determination, and documentation

## 2021-07-20 ENCOUNTER — Ambulatory Visit: Payer: Medicare Other | Admitting: Pharmacist

## 2021-07-20 ENCOUNTER — Telehealth: Payer: Self-pay | Admitting: Pharmacist

## 2021-07-20 ENCOUNTER — Other Ambulatory Visit: Payer: Self-pay

## 2021-07-20 DIAGNOSIS — I4821 Permanent atrial fibrillation: Secondary | ICD-10-CM

## 2021-07-20 DIAGNOSIS — Z5181 Encounter for therapeutic drug level monitoring: Secondary | ICD-10-CM

## 2021-07-20 LAB — POCT INR: INR: 3.4 — AB (ref 2.0–3.0)

## 2021-07-20 NOTE — Patient Instructions (Signed)
INR above goal. Decrease weekly dose to 2.5 mg every Wed, Fri and 5 mg all other days. Rechecked INR in 3 weeks.

## 2021-07-20 NOTE — Progress Notes (Signed)
Anticoagulation Management Gregory [name] is a 76 y.o. male who reports to the clinic for monitoring of warfarin treatment.    Indication: atrial fibrillation CHA2DS2 Vasc Score 4 (Age 1-74, HTN hx, DM Hx, Vascular disease hx) , HAS-BLED 1 (age>65)  Duration: indefinite Supervising physician: Adrian Prows  Anticoagulation Clinic Visit History:  Patient does not report signs/symptoms of bleeding or thromboembolism   Other recent changes: No changes in diet, lifestyle.  Pt planning on staying consistent with 2-3 servings of Vit K servings/week. Pt reports that he had only 1 serving over the past week or so. Planing on returning back to previous baseline.   Recent concerns of UTI. Was started on course Augmentin and just finished his course last Friday.   Anticoagulation Episode Summary     Current INR goal:  2.0-3.0  TTR:  61.6 % (2.2 y)  Next INR check:  08/10/2021  INR from last check:  3.4 (07/20/2021)  Weekly max warfarin dose:    Target end date:  Indefinite  INR check location:    Preferred lab:    Send INR reminders to:     Indications   Atrial fibrillation (HCC) [I48.91] Monitoring for long-term anticoagulant use [Z51.81 Z79.01]        Comments:          No Known Allergies  Current Outpatient Medications:    allopurinol (ZYLOPRIM) 300 MG tablet, Take 300 mg by mouth daily., Disp: , Rfl:    amLODipine (NORVASC) 5 MG tablet, TAKE 1 TABLET BY MOUTH  DAILY, Disp: 90 tablet, Rfl: 3   atorvastatin (LIPITOR) 20 MG tablet, TAKE 1 TABLET BY MOUTH ONCE DAILY AT  6PM, Disp: 90 tablet, Rfl: 1   carvedilol (COREG) 25 MG tablet, Take 25 mg by mouth 2 (two) times daily., Disp: , Rfl:    colchicine 0.6 MG tablet, Take 1 tablet by mouth as needed. Take 2 tabs initially then take 1 tab in 1 hr for gout attack, Disp: , Rfl:    furosemide (LASIX) 20 MG tablet, TAKE 1 TABLET BY MOUTH ONCE DAILY IN THE MORNING, Disp: 90 tablet, Rfl: 0   glipiZIDE (GLUCOTROL) 5 MG tablet, Take 2.5 mg by  mouth daily before breakfast., Disp: , Rfl:    hydrALAZINE (APRESOLINE) 100 MG tablet, Take 100 mg by mouth 2 (two) times daily. , Disp: , Rfl:    ONETOUCH VERIO test strip, 1 each daily., Disp: , Rfl:    spironolactone (ALDACTONE) 25 MG tablet, TAKE 1 TABLET BY MOUTH ONCE DAILY IN THE MORNING, Disp: 90 tablet, Rfl: 0   Tamsulosin HCl (FLOMAX) 0.4 MG CAPS, Take 0.4 mg by mouth daily., Disp: , Rfl:    valsartan (DIOVAN) 80 MG tablet, Take 80 mg by mouth daily., Disp: , Rfl:    warfarin (COUMADIN) 5 MG tablet, Take 1 tablet (5 mg total) by mouth daily. Or as directed by coumadin clinic, Disp: 100 tablet, Rfl: 2 Past Medical History:  Diagnosis Date   CAD (coronary artery disease)    COPD (chronic obstructive pulmonary disease) ([place])    Diabetes mellitus    Heart attack ([place])    Hyperlipidemia    Hypertension    Neuromuscular disorder ([place])    SOB (shortness of breath) on exertion    ASSESSMENT  Recent Results: The most recent result is correlated with 32.5 mg per week:  Lab Results  Component Value Date   INR 3.4 (A) 07/20/2021   INR 3.6 (A) 06/29/2021   INR 3.1 (A) 05/28/2021  Anticoagulation Dosing: Description   INR above goal. Decrease weekly dose to 2.5 mg every Wed, Fri and 5 mg all other days. Rechecked INR in 3 weeks.      INR today: Supratherapeutic. INR continues to stay elevated on current weekly dose. Recent decreased Vit K intake from previous baseline intake, could also be contributing to supratheraputic INR readings. Pt planing on returning ot previous baseline Vit K intake of 3 servings/week. Denies any other relevant changes in diet, medications, or lifestyles. Denies any bleeding or bruising symptoms. In setting of unexplained persistently elevated INR, will decrease weekly dose and continue close monitoring and follow up  PLAN Weekly dose was decreased by 7.7% to 30 mg/week. Decrease weekly dose to 2.5 mg every Wed, Fri and 5 mg all other days. Recheck INR in  3 weeks.   Patient Instructions  INR above goal. Decrease weekly dose to 2.5 mg every Wed, Fri and 5 mg all other days. Rechecked INR in 3 weeks.  Patient advised to contact clinic or seek medical attention if signs/symptoms of bleeding or thromboembolism occur.  Patient verbalized understanding by repeating back information and was advised to contact me if further medication-related questions arise.   Follow-up Return in about 3 weeks (around 08/10/2021).  Alysia Penna, PharmD  15 minutes spent face-to-face with the patient during the encounter. 50% of time spent on education, including signs/sx bleeding and clotting, as well as food and drug interactions with warfarin. 50% of time was spent on fingerprick POC INR sample collection,processing, results determination, and documentation

## 2021-07-21 NOTE — Telephone Encounter (Signed)
I did not get the complete message. If low HR, and symptomatic, no change needed

## 2021-07-23 NOTE — Progress Notes (Deleted)
[facility] NOTE  Patient Care Team: Sonia Side., FNP as PCP - General (Family Medicine)  CHIEF COMPLAINTS/PURPOSE OF CONSULTATION:  Abnormal labs.  ASSESSMENT & PLAN:  No problem-specific Assessment & Plan notes found for this encounter.  Light chain MGUS  This is a very pleasant 76 year old male patient with past medical history significant for diabetes, hypertension, atrial fibrillation, chronic kidney disease who was referred to hematology for evaluation of abnormal labs I reviewed his labs which showed hemoglobin of 12.2, creatinine of 1.6-1.9, normal calcium, normal total protein, normal alkaline phosphatase.  No monoclonal spike observed.  Kappa lambda ratio however elevated at 3.5 with kappa light chain of 109. BMB with no evidence of MM. Bone survey neg  Given no evidence of MM so far, I recommended that he return in 6 months for FU. He is agreeable to these recommendations. He is here for a follow up.  2. CKD stage III Unrelated to the MGUS. He should continue FU with his nephrologist.  No orders of the defined types were placed in this encounter.   HISTORY OF PRESENTING ILLNESS:  Gregory [name] 76 y.o. male is here because of some abnormal labs, specifically increased free light chains.  This is a very pleasant 76 year old male patient with past medical history significant for type 2 diabetes mellitus, hypertension, atrial fibrillation on anticoagulation who presented to hematology for worsening chronic kidney disease and elevated kappa light chain ratio.    Interim History  Since last visit, he had his BMB No soreness reported at the site He feels well overall. No interim B symptoms, new bone pains. He has a kidney doctor which he follows up with periodically.  SOCIAL HISTORY  He retired from [employer] company.   He quit smoking in 2005.   Rest of the pertinent 10 point ROS as mentioned below and negative  REVIEW OF SYSTEMS:    Constitutional: Denies fevers, chills or abnormal night sweats Eyes: Denies blurriness of vision, double vision or watery eyes Ears, nose, mouth, throat, and face: Denies mucositis or sore throat Respiratory: Denies cough, dyspnea or wheezes Cardiovascular: Denies palpitation, chest discomfort or lower extremity swelling Gastrointestinal:  Denies nausea, heartburn or change in bowel habits Skin: Denies abnormal skin rashes Lymphatics: Denies new lymphadenopathy or easy bruising Neurological:Denies numbness, tingling or new weaknesses Behavioral/Psych: Mood is stable, no new changes  All other systems were reviewed with the patient and are negative.  MEDICAL HISTORY:  Past Medical History:  Diagnosis Date   CAD (coronary artery disease)    COPD (chronic obstructive pulmonary disease) ([place])    Diabetes mellitus    Heart attack ([place])    Hyperlipidemia    Hypertension    Neuromuscular disorder (HCC)    SOB (shortness of breath) on exertion     SURGICAL HISTORY: Past Surgical History:  Procedure Laterality Date   BACK SURGERY     CARDIAC CATHETERIZATION     CORONARY STENT PLACEMENT     KNEE ARTHROPLASTY     LEFT HEART CATH AND CORONARY ANGIOGRAPHY N/A 03/19/2019   Procedure: LEFT HEART CATH AND CORONARY ANGIOGRAPHY;  Surgeon: Nigel Mormon, MD;  Location: [facility];  Service: Cardiovascular;  Laterality: N/A;   LEFT HEART CATHETERIZATION WITH CORONARY ANGIOGRAM N/A 03/03/2012   Procedure: LEFT HEART CATHETERIZATION WITH CORONARY ANGIOGRAM;  Surgeon: Laverda Page, MD;  Location: [facility] CATH LAB;  Service: Cardiovascular;  Laterality: N/A;   PERCUTANEOUS CORONARY STENT INTERVENTION (PCI-S)  03/03/2012  Procedure: PERCUTANEOUS CORONARY STENT INTERVENTION (PCI-S);  Surgeon: Laverda Page, MD;  Location: [facility] CATH LAB;  Service: Cardiovascular;;    SOCIAL HISTORY: Social History   Socioeconomic History   Marital status: Married    Spouse name: Not on file    Number of children: 3   Years of education: Not on file   Highest education level: Not on file  Occupational History   Occupation: retired  Tobacco Use   Smoking status: Former    Packs/day: 1.00    Years: 30.00    Pack years: 30.00    Types: Cigarettes    Quit date: 07/30/2003    Years since quitting: 17.9   Smokeless tobacco: Never  Vaping Use   Vaping Use: Never used  Substance and Sexual Activity   Alcohol use: Yes    Alcohol/week: 0.0 standard drinks    Comment: wine rarely   Drug use: No   Sexual activity: Not on file  Other Topics Concern   Not on file  Social History Narrative   Not on file   Social Determinants of Health   Financial Resource Strain: Not on file  Food Insecurity: Not on file  Transportation Needs: Not on file  Physical Activity: Not on file  Stress: Not on file  Social Connections: Not on file  Intimate Partner Violence: Not on file    FAMILY HISTORY: Family History  Problem Relation Age of Onset   Hypertension Father    Hyperlipidemia Father    Stroke Father    Hypertension Paternal Grandfather    Hyperlipidemia Paternal Grandfather    Diabetes Other        maternal uncles   Diabetes Sister    Cancer Mother    Colon cancer Neg Hx     ALLERGIES:  has No Known Allergies.  MEDICATIONS:  Current Outpatient Medications  Medication Sig Dispense Refill   allopurinol (ZYLOPRIM) 300 MG tablet Take 300 mg by mouth daily.     amLODipine (NORVASC) 5 MG tablet TAKE 1 TABLET BY MOUTH  DAILY 90 tablet 3   atorvastatin (LIPITOR) 20 MG tablet TAKE 1 TABLET BY MOUTH ONCE DAILY AT  6PM 90 tablet 1   carvedilol (COREG) 25 MG tablet Take 25 mg by mouth 2 (two) times daily.     colchicine 0.6 MG tablet Take 1 tablet by mouth as needed. Take 2 tabs initially then take 1 tab in 1 hr for gout attack     furosemide (LASIX) 20 MG tablet TAKE 1 TABLET BY MOUTH ONCE DAILY IN THE MORNING 90 tablet 0   glipiZIDE (GLUCOTROL) 5 MG tablet Take 2.5 mg by mouth  daily before breakfast.     hydrALAZINE (APRESOLINE) 100 MG tablet Take 100 mg by mouth 2 (two) times daily.      ONETOUCH VERIO test strip 1 each daily.     spironolactone (ALDACTONE) 25 MG tablet TAKE 1 TABLET BY MOUTH ONCE DAILY IN THE MORNING 90 tablet 0   Tamsulosin HCl (FLOMAX) 0.4 MG CAPS Take 0.4 mg by mouth daily.     valsartan (DIOVAN) 80 MG tablet Take 80 mg by mouth daily.     warfarin (COUMADIN) 5 MG tablet Take 1 tablet (5 mg total) by mouth daily. Or as directed by coumadin clinic 100 tablet 2   No current facility-administered medications for this visit.     PHYSICAL EXAMINATION: ECOG PERFORMANCE STATUS: 0 - Asymptomatic  There were no vitals filed for this visit.  There were no  vitals filed for this visit.   GENERAL:alert, no distress and comfortable, walks with a cane. SKIN: skin color, texture, turgor are normal, no rashes or significant lesions EYES: normal, conjunctiva are pink and non-injected, sclera clear OROPHARYNX:no exudate, no erythema and lips, buccal mucosa, and tongue normal  NECK: supple, thyroid normal size, non-tender, without nodularity LYMPH:  no palpable lymphadenopathy in the cervical, axillary or inguinal LUNGS: clear to auscultation and percussion with normal breathing effort HEART: regular rate & rhythm. S 3 heard. BLE edema. ABDOMEN:abdomen soft, non-tender and normal bowel sounds Musculoskeletal:no cyanosis of digits and no clubbing  PSYCH: alert & oriented x 3 with fluent speech NEURO: no focal motor/sensory deficits  LABORATORY DATA:  I have reviewed the data as listed Lab Results  Component Value Date   WBC 5.6 08/10/2020   HGB 12.2 (L) 08/10/2020   HCT 38.3 (L) 08/10/2020   MCV 84.5 08/10/2020   PLT 196 08/10/2020     Chemistry      Component Value Date/Time   NA 140 06/01/2020 1158   NA 141 09/13/2019 1310   K 3.8 06/01/2020 1158   CL 104 06/01/2020 1158   CO2 26 06/01/2020 1158   BUN 16 06/01/2020 1158   BUN 29 (H)  09/13/2019 1310   CREATININE 1.79 (H) 06/01/2020 1158      Component Value Date/Time   CALCIUM 9.8 06/01/2020 1158   ALKPHOS 126 06/01/2020 1158   AST 11 (L) 06/01/2020 1158   ALT 9 06/01/2020 1158   BILITOT 0.8 06/01/2020 1158      CMP with normal calcium, normal total protein and normal alk phos.    Creatinine of 1.68 Hb of 12 No M spike on SPEP Urine protein minimal. Monoclonal protein on urine not examined. K/L ratio of 3.52  Surgical Pathology   Reason for Addendum #1:  Molecular Genetic Test Results, FISH   Clinical History: Monoclonal gammopathy      DIAGNOSIS:   BONE MARROW, ASPIRATE, CLOT, CORE:  -  Normocellular bone marrow with trilineage hematopoiesis and mild  plasmacytosis  -  See comment   PERIPHERAL BLOOD:  -  Normocytic anemia     COMMENT:   Plasma cells are mildly increased on aspirate smears (4% by manual  differential count), with scattered plasma cells showing atypical  morphology.  CD138 immunohistochemistry on the clot and core biopsy  highlight plasma cells comprising 3-5% and 2% of overall marrow  cellularity, respectively. Light chain in situ hybridization reveals a  mixture of kappa and lambda expressing plasma cells, with focal plasma  cell groups showing kappa predominance. Together, low-level involvement  by a plasma cell neoplasm cannot be excluded.  Correlation with clinical  findings, radiographic studies, other laboratory data, and  cytogenetic/FISH results is recommended.   FISH no abnormalities.  RADIOGRAPHIC STUDIES: I have personally reviewed the radiological images as listed and agreed with the findings in the report. No results found.   All questions were answered. The patient knows to call the clinic with any problems, questions or concerns. I spent 30 minutes in the care of this patient including H and P, review of records, counseling, documentation and coordination of care.    Benay Pike, MD 07/23/2021  12:33 PM

## 2021-07-24 ENCOUNTER — Inpatient Hospital Stay: Payer: Medicare Other | Admitting: Hematology and Oncology

## 2021-07-24 ENCOUNTER — Inpatient Hospital Stay: Payer: Medicare Other

## 2021-08-10 ENCOUNTER — Ambulatory Visit: Payer: Medicare Other | Admitting: Pharmacist

## 2021-08-10 ENCOUNTER — Other Ambulatory Visit: Payer: Self-pay

## 2021-08-10 DIAGNOSIS — Z5181 Encounter for therapeutic drug level monitoring: Secondary | ICD-10-CM

## 2021-08-10 DIAGNOSIS — I4821 Permanent atrial fibrillation: Secondary | ICD-10-CM

## 2021-08-10 DIAGNOSIS — Z7901 Long term (current) use of anticoagulants: Secondary | ICD-10-CM

## 2021-08-10 LAB — POCT INR: INR: 3.4 — AB (ref 2.0–3.0)

## 2021-08-10 NOTE — Patient Instructions (Addendum)
INR above goal. HOLD today and then continue current weekly dose of 2.5 mg every Wed, Fri and 5 mg all other days. Rechecked INR in 3 weeks.

## 2021-08-10 NOTE — Progress Notes (Signed)
Anticoagulation Management Gregory [name] is a 76 y.o. male who reports to the clinic for monitoring of warfarin treatment.    Indication: atrial fibrillation CHA2DS2 Vasc Score 4 (Age 55-74, HTN hx, DM Hx, Vascular disease hx) , HAS-BLED 1 (age>65)  Duration: indefinite Supervising physician: Adrian Prows  Anticoagulation Clinic Visit History:  Patient does not report signs/symptoms of bleeding or thromboembolism   Other recent changes: No changes in diet, lifestyle.  Pt planning on staying consistent with 2-3 servings of Vit K servings/week. Pt reports that he had only 1 serving mild Vit K intake. Previously was eating high Vit K intake consisting . Planing on returning back to previous baseline.   Anticoagulation Episode Summary     Current INR goal:  2.0-3.0  TTR:  60.1 % (2.2 y)  Next INR check:  08/31/2021  INR from last check:  3.4 (08/10/2021)  Weekly max warfarin dose:    Target end date:  Indefinite  INR check location:    Preferred lab:    Send INR reminders to:     Indications   Atrial fibrillation (HCC) [I48.91] Monitoring for long-term anticoagulant use [Z51.81 Z79.01]        Comments:          No Known Allergies  Current Outpatient Medications:    allopurinol (ZYLOPRIM) 300 MG tablet, Take 300 mg by mouth daily., Disp: , Rfl:    amLODipine (NORVASC) 5 MG tablet, TAKE 1 TABLET BY MOUTH  DAILY, Disp: 90 tablet, Rfl: 3   atorvastatin (LIPITOR) 20 MG tablet, TAKE 1 TABLET BY MOUTH ONCE DAILY AT  6PM, Disp: 90 tablet, Rfl: 1   carvedilol (COREG) 25 MG tablet, Take 25 mg by mouth 2 (two) times daily., Disp: , Rfl:    colchicine 0.6 MG tablet, Take 1 tablet by mouth as needed. Take 2 tabs initially then take 1 tab in 1 hr for gout attack, Disp: , Rfl:    furosemide (LASIX) 20 MG tablet, TAKE 1 TABLET BY MOUTH ONCE DAILY IN THE MORNING, Disp: 90 tablet, Rfl: 0   glipiZIDE (GLUCOTROL) 5 MG tablet, Take 2.5 mg by mouth daily before breakfast., Disp: , Rfl:     hydrALAZINE (APRESOLINE) 100 MG tablet, Take 100 mg by mouth 2 (two) times daily. , Disp: , Rfl:    ONETOUCH VERIO test strip, 1 each daily., Disp: , Rfl:    spironolactone (ALDACTONE) 25 MG tablet, TAKE 1 TABLET BY MOUTH ONCE DAILY IN THE MORNING, Disp: 90 tablet, Rfl: 0   Tamsulosin HCl (FLOMAX) 0.4 MG CAPS, Take 0.4 mg by mouth daily., Disp: , Rfl:    valsartan (DIOVAN) 80 MG tablet, Take 80 mg by mouth daily., Disp: , Rfl:    warfarin (COUMADIN) 5 MG tablet, Take 1 tablet (5 mg total) by mouth daily. Or as directed by coumadin clinic, Disp: 100 tablet, Rfl: 2 Past Medical History:  Diagnosis Date   CAD (coronary artery disease)    COPD (chronic obstructive pulmonary disease) ([place])    Diabetes mellitus    Heart attack ([place])    Hyperlipidemia    Hypertension    Neuromuscular disorder ([place])    SOB (shortness of breath) on exertion    ASSESSMENT  Recent Results: The most recent result is correlated with 30 mg per week:  Lab Results  Component Value Date   INR 3.4 (A) 08/10/2021   INR 3.4 (A) 07/20/2021   INR 3.6 (A) 06/29/2021    Anticoagulation Dosing: Description   INR above goal.  HOLD today and then continue current weekly dose of 2.5 mg every Wed, Fri and 5 mg all other days. Rechecked INR in 3 weeks.      INR today: Supratherapeutic. INR remains elevated despite recent weekly dose decrease. Recent decreased Vit K intake from previous baseline intake, could also be contributing to supratheraputic INR readings. Pt planing on returning ot previous baseline Vit K intake of 3 servings of High Vit K/week. Denies any other relevant changes in diet, medications, or lifestyles. Denies any bleeding or bruising symptoms. Will continue current weekly dose and recommended pt return to previously discussed Vit K intake and continue close monitoring and follow up.   PLAN Weekly dose was unchanged by 0% to 30 mg/week. HOLD today and then continue current weekly dose of 2.5 mg every Wed, Fri  and 5 mg all other days. Recheck INR in 3 weeks.   Patient Instructions  INR above goal. HOLD today and then continue current weekly dose of 2.5 mg every Wed, Fri and 5 mg all other days. Rechecked INR in 3 weeks.  Patient advised to contact clinic or seek medical attention if signs/symptoms of bleeding or thromboembolism occur.  Patient verbalized understanding by repeating back information and was advised to contact me if further medication-related questions arise.   Follow-up Return in about 3 weeks (around 08/31/2021).  Alysia Penna, PharmD  15 minutes spent face-to-face with the patient during the encounter. 50% of time spent on education, including signs/sx bleeding and clotting, as well as food and drug interactions with warfarin. 50% of time was spent on fingerprick POC INR sample collection,processing, results determination, and documentation

## 2021-08-13 ENCOUNTER — Other Ambulatory Visit: Payer: Self-pay | Admitting: Student

## 2021-08-13 DIAGNOSIS — I10 Essential (primary) hypertension: Secondary | ICD-10-CM

## 2021-08-21 ENCOUNTER — Telehealth: Payer: Self-pay | Admitting: Hematology and Oncology

## 2021-08-21 ENCOUNTER — Other Ambulatory Visit: Payer: Self-pay | Admitting: Hematology and Oncology

## 2021-08-21 ENCOUNTER — Inpatient Hospital Stay: Payer: Medicare Other | Admitting: Hematology and Oncology

## 2021-08-21 ENCOUNTER — Inpatient Hospital Stay: Payer: Medicare Other

## 2021-08-21 NOTE — Telephone Encounter (Signed)
.  Called patient to schedule appointment per 2/28 inbasket, patient is aware of date and time.

## 2021-08-21 NOTE — Progress Notes (Signed)
Gregory [name] NOTE  Patient Care Team: Sonia Side., FNP as PCP - General (Family Medicine)  CHIEF COMPLAINTS/PURPOSE OF CONSULTATION:  Abnormal labs.  ASSESSMENT & PLAN:  No problem-specific Assessment & Plan notes found for this encounter.  Light chain MGUS  This is a very pleasant 76 year old male patient with past medical history significant for diabetes, hypertension, atrial fibrillation, chronic kidney disease who was referred to hematology for evaluation of abnormal labs I reviewed his labs which showed hemoglobin of 12.2, creatinine of 1.6-1.9, normal calcium, normal total protein, normal alkaline phosphatase.  No monoclonal spike observed.  Kappa lambda ratio however elevated at 3.5 with kappa light chain of 109. BMB with no evidence of MM. Bone survey hasnt been scheduled, gave him the number for centralized scheduling to contact and scheduled. Baseline UPEP ordered.  Given no evidence of MM so far, I recommended that he return in 6 months for FU. He is agreeable to these recommendations. He however knows that baseline bone survey has to be scheduled. Thank you for consulting [place] in the care of this patient.  Please not hesitate to contact us with any additional questions or concerns  2. CKD stage III Unrelated to the MGUS. He should continue FU with his nephrologist.  Orders Placed This Encounter  Procedures   CBC with Differential/Platelet    Standing Status:   Standing    Number of Occurrences:   22    Standing Expiration Date:   08/21/2022   SPEP with reflex to IFE    Standing Status:   Future    Standing Expiration Date:   08/21/2022   Kappa/lambda light chains    Standing Status:   Future    Standing Expiration Date:   08/21/2022   IgG, IgA, IgM    Standing Status:   Future    Standing Expiration Date:   08/21/2022   Comprehensive metabolic panel    Standing Status:   Standing    Number of Occurrences:   33    Standing Expiration  Date:   08/21/2022     HISTORY OF PRESENTING ILLNESS:  Gregory [name] 76 y.o. male is here because of some abnormal labs, specifically increased free light chains.  This is a very pleasant 76 year old male patient with past medical history significant for type 2 diabetes mellitus, hypertension, atrial fibrillation on anticoagulation who presented to hematology for worsening chronic kidney disease and elevated kappa light chain ratio.    Interim History  Since last visit, he had his BMB No soreness reported at the site He feels well overall. No interim B symptoms, new bone pains. He has a kidney doctor which he follows up with periodically.  SOCIAL HISTORY  He retired from [employer] company.   He quit smoking in 2005.   Rest of the pertinent 10 point ROS as mentioned below and negative  REVIEW OF SYSTEMS:   Constitutional: Denies fevers, chills or abnormal night sweats Eyes: Denies blurriness of vision, double vision or watery eyes Ears, nose, mouth, throat, and face: Denies mucositis or sore throat Respiratory: Denies cough, dyspnea or wheezes Cardiovascular: Denies palpitation, chest discomfort or lower extremity swelling Gastrointestinal:  Denies nausea, heartburn or change in bowel habits Skin: Denies abnormal skin rashes Lymphatics: Denies new lymphadenopathy or easy bruising Neurological:Denies numbness, tingling or new weaknesses Behavioral/Psych: Mood is stable, no new changes  All other systems were reviewed with the patient and are negative.  MEDICAL HISTORY:  Past Medical History:  Diagnosis Date   CAD (coronary artery disease)    COPD (chronic obstructive pulmonary disease) (HCC)    Diabetes mellitus    Heart attack (HCC)    Hyperlipidemia    Hypertension    Neuromuscular disorder (HCC)    SOB (shortness of breath) on exertion     SURGICAL HISTORY: Past Surgical History:  Procedure Laterality Date   BACK SURGERY     CARDIAC CATHETERIZATION      CORONARY STENT PLACEMENT     KNEE ARTHROPLASTY     LEFT HEART CATH AND CORONARY ANGIOGRAPHY N/A 03/19/2019   Procedure: LEFT HEART CATH AND CORONARY ANGIOGRAPHY;  Surgeon: Nigel Mormon, MD;  Location: [facility];  Service: Cardiovascular;  Laterality: N/A;   LEFT HEART CATHETERIZATION WITH CORONARY ANGIOGRAM N/A 03/03/2012   Procedure: LEFT HEART CATHETERIZATION WITH CORONARY ANGIOGRAM;  Surgeon: Laverda Page, MD;  Location: [facility];  Service: Cardiovascular;  Laterality: N/A;   PERCUTANEOUS CORONARY STENT INTERVENTION (PCI-S)  03/03/2012   Procedure: PERCUTANEOUS CORONARY STENT INTERVENTION (PCI-S);  Surgeon: Laverda Page, MD;  Location: [facility] CATH LAB;  Service: Cardiovascular;;    SOCIAL HISTORY: Social History   Socioeconomic History   Marital status: Married    Spouse name: Not on file   Number of children: 3   Years of education: Not on file   Highest education level: Not on file  Occupational History   Occupation: retired  Tobacco Use   Smoking status: Former    Packs/day: 1.00    Years: 30.00    Pack years: 30.00    Types: Cigarettes    Quit date: 07/30/2003    Years since quitting: 18.0   Smokeless tobacco: Never  Vaping Use   Vaping Use: Never used  Substance and Sexual Activity   Alcohol use: Yes    Alcohol/week: 0.0 standard drinks    Comment: wine rarely   Drug use: No   Sexual activity: Not on file  Other Topics Concern   Not on file  Social History Narrative   Not on file   Social Determinants of Health   Financial Resource Strain: Not on file  Food Insecurity: Not on file  Transportation Needs: Not on file  Physical Activity: Not on file  Stress: Not on file  Social Connections: Not on file  Intimate Partner Violence: Not on file    FAMILY HISTORY: Family History  Problem Relation Age of Onset   Hypertension Father    Hyperlipidemia Father    Stroke Father    Hypertension Paternal Grandfather     Hyperlipidemia Paternal Grandfather    Diabetes Other        maternal uncles   Diabetes Sister    Cancer Mother    Colon cancer Neg Hx     ALLERGIES:  has No Known Allergies.  MEDICATIONS:  Current Outpatient Medications  Medication Sig Dispense Refill   allopurinol (ZYLOPRIM) 300 MG tablet Take 300 mg by mouth daily.     amLODipine (NORVASC) 5 MG tablet TAKE 1 TABLET BY MOUTH  DAILY 90 tablet 3   atorvastatin (LIPITOR) 20 MG tablet TAKE 1 TABLET BY MOUTH ONCE DAILY AT  6PM 90 tablet 1   carvedilol (COREG) 25 MG tablet Take 25 mg by mouth 2 (two) times daily.     colchicine 0.6 MG tablet Take 1 tablet by mouth as needed. Take 2 tabs initially then take 1 tab in 1 hr for gout attack     furosemide (LASIX)  20 MG tablet TAKE 1 TABLET BY MOUTH ONCE DAILY IN THE MORNING 90 tablet 0   glipiZIDE (GLUCOTROL) 5 MG tablet Take 2.5 mg by mouth daily before breakfast.     hydrALAZINE (APRESOLINE) 100 MG tablet Take 100 mg by mouth 2 (two) times daily.      ONETOUCH VERIO test strip 1 each daily.     spironolactone (ALDACTONE) 25 MG tablet TAKE 1 TABLET BY MOUTH ONCE DAILY IN THE MORNING 90 tablet 0   Tamsulosin HCl (FLOMAX) 0.4 MG CAPS Take 0.4 mg by mouth daily.     valsartan (DIOVAN) 80 MG tablet Take 80 mg by mouth daily.     warfarin (COUMADIN) 5 MG tablet Take 1 tablet (5 mg total) by mouth daily. Or as directed by coumadin clinic 100 tablet 2   No current facility-administered medications for this visit.     PHYSICAL EXAMINATION: ECOG PERFORMANCE STATUS: 0 - Asymptomatic  There were no vitals filed for this visit.  There were no vitals filed for this visit.   GENERAL:alert, no distress and comfortable, walks with a cane. SKIN: skin color, texture, turgor are normal, no rashes or significant lesions EYES: normal, conjunctiva are pink and non-injected, sclera clear OROPHARYNX:no exudate, no erythema and lips, buccal mucosa, and tongue normal  NECK: supple,  thyroid normal size, non-tender, without nodularity LYMPH:  no palpable lymphadenopathy in the cervical, axillary or inguinal LUNGS: clear to auscultation and percussion with normal breathing effort HEART: regular rate & rhythm. S 3 heard. BLE edema. ABDOMEN:abdomen soft, non-tender and normal bowel sounds Musculoskeletal:no cyanosis of digits and no clubbing  PSYCH: alert & oriented x 3 with fluent speech NEURO: no focal motor/sensory deficits  LABORATORY DATA:  I have reviewed the data as listed Lab Results  Component Value Date   WBC 5.6 08/10/2020   HGB 12.2 (L) 08/10/2020   HCT 38.3 (L) 08/10/2020   MCV 84.5 08/10/2020   PLT 196 08/10/2020     Chemistry      Component Value Date/Time   NA 140 06/01/2020 1158   NA 141 09/13/2019 1310   K 3.8 06/01/2020 1158   CL 104 06/01/2020 1158   CO2 26 06/01/2020 1158   BUN 16 06/01/2020 1158   BUN 29 (H) 09/13/2019 1310   CREATININE 1.79 (H) 06/01/2020 1158      Component Value Date/Time   CALCIUM 9.8 06/01/2020 1158   ALKPHOS 126 06/01/2020 1158   AST 11 (L) 06/01/2020 1158   ALT 9 06/01/2020 1158   BILITOT 0.8 06/01/2020 1158      CMP with normal calcium, normal total protein and normal alk phos.    Creatinine of 1.68 Hb of 12 No M spike on SPEP Urine protein minimal. Monoclonal protein on urine not examined. K/L ratio of 3.52  Surgical Pathology   Reason for Addendum #1:  Molecular Genetic Test Results, FISH   Clinical History: Monoclonal gammopathy      DIAGNOSIS:   BONE MARROW, ASPIRATE, CLOT, CORE:  -  Normocellular bone marrow with trilineage hematopoiesis and mild  plasmacytosis  -  See comment   PERIPHERAL BLOOD:  -  Normocytic anemia     COMMENT:   Plasma cells are mildly increased on aspirate smears (4% by manual  differential count), with scattered plasma cells showing atypical  morphology.  CD138 immunohistochemistry on the clot and core biopsy  highlight plasma cells comprising 3-5%  and 2% of overall marrow  cellularity, respectively. Light chain in situ hybridization reveals  a  mixture of kappa and lambda expressing plasma cells, with focal plasma  cell groups showing kappa predominance. Together, low-level involvement  by a plasma cell neoplasm cannot be excluded.  Correlation with clinical  findings, radiographic studies, other laboratory data, and  cytogenetic/FISH results is recommended.   FISH no abnormalities.  RADIOGRAPHIC STUDIES: I have personally reviewed the radiological images as listed and agreed with the findings in the report. No results found.   All questions were answered. The patient knows to call the clinic with any problems, questions or concerns. I spent 30 minutes in the care of this patient including H and P, review of records, counseling, documentation and coordination of care.    Benay Pike, MD 08/22/2021 7:15 AM This encounter was created in error - please disregard.

## 2021-08-31 ENCOUNTER — Ambulatory Visit: Payer: Medicare Other | Admitting: Pharmacist

## 2021-08-31 ENCOUNTER — Other Ambulatory Visit: Payer: Self-pay

## 2021-08-31 DIAGNOSIS — I4821 Permanent atrial fibrillation: Secondary | ICD-10-CM

## 2021-08-31 DIAGNOSIS — Z5181 Encounter for therapeutic drug level monitoring: Secondary | ICD-10-CM

## 2021-08-31 LAB — POCT INR: INR: 3.9 — AB (ref 2.0–3.0)

## 2021-08-31 NOTE — Progress Notes (Signed)
Anticoagulation Management ?Gregory [name] is a 76 y.o. male who reports to the clinic for monitoring of warfarin treatment.   ? ?Indication: atrial fibrillation CHA2DS2 Vasc Score 4 (Age 21-74, HTN hx, DM Hx, Vascular disease hx) , HAS-BLED 1 (age>65)  ?Duration: indefinite ?Supervising physician: Adrian Prows ? ?Anticoagulation Clinic Visit History: ? ?Patient does not report signs/symptoms of bleeding or thromboembolism  ? ?Other recent changes: No changes in diet, lifestyle. ? ?Pt planning on staying consistent with 2-3 servings of Vit K servings/week. Pt reports that he had only 1 serving mild Vit K intake. Previously was eating high Vit K intake consisting . Planing on returning back to previous baseline.  ? ?Pt with upcoming dentist appt on 09/20/21 for dental filling. ? ?Anticoagulation Episode Summary   ? ? Current INR goal:  2.0-3.0  ?TTR:  58.5 % (2.3 y)  ?Next INR check:  09/25/2021  ?INR from last check:  3.9 (08/31/2021)  ?Weekly max warfarin dose:    ?Target end date:  Indefinite  ?INR check location:    ?Preferred lab:    ?Send INR reminders to:    ? Indications   ?Atrial fibrillation ([place]) [I48.91] ?Monitoring for long-term anticoagulant use [Z51.81 ?Z79.01] ? ?  ?  ? ? Comments:    ?  ? ?  ? ?No Known Allergies ? ?Current Outpatient Medications:  ?  allopurinol (ZYLOPRIM) 300 MG tablet, Take 300 mg by mouth daily., Disp: , Rfl:  ?  amLODipine (NORVASC) 5 MG tablet, TAKE 1 TABLET BY MOUTH  DAILY, Disp: 90 tablet, Rfl: 3 ?  atorvastatin (LIPITOR) 20 MG tablet, TAKE 1 TABLET BY MOUTH ONCE DAILY AT  6PM, Disp: 90 tablet, Rfl: 1 ?  carvedilol (COREG) 25 MG tablet, Take 25 mg by mouth 2 (two) times daily., Disp: , Rfl:  ?  colchicine 0.6 MG tablet, Take 1 tablet by mouth as needed. Take 2 tabs initially then take 1 tab in 1 hr for gout attack, Disp: , Rfl:  ?  furosemide (LASIX) 20 MG tablet, TAKE 1 TABLET BY MOUTH ONCE DAILY IN THE MORNING, Disp: 90 tablet, Rfl: 0 ?  glipiZIDE (GLUCOTROL) 5 MG tablet, Take 2.5  mg by mouth daily before breakfast., Disp: , Rfl:  ?  hydrALAZINE (APRESOLINE) 100 MG tablet, Take 100 mg by mouth 2 (two) times daily. , Disp: , Rfl:  ?  ONETOUCH VERIO test strip, 1 each daily., Disp: , Rfl:  ?  spironolactone (ALDACTONE) 25 MG tablet, TAKE 1 TABLET BY MOUTH ONCE DAILY IN THE MORNING, Disp: 90 tablet, Rfl: 0 ?  Tamsulosin HCl (FLOMAX) 0.4 MG CAPS, Take 0.4 mg by mouth daily., Disp: , Rfl:  ?  valsartan (DIOVAN) 80 MG tablet, Take 80 mg by mouth daily., Disp: , Rfl:  ?  warfarin (COUMADIN) 5 MG tablet, Take 1 tablet (5 mg total) by mouth daily. Or as directed by coumadin clinic, Disp: 100 tablet, Rfl: 2 ?Past Medical History:  ?Diagnosis Date  ? CAD (coronary artery disease)   ? COPD (chronic obstructive pulmonary disease) ([place])   ? Diabetes mellitus   ? Heart attack ([place])   ? Hyperlipidemia   ? Hypertension   ? Neuromuscular disorder ([place])   ? SOB (shortness of breath) on exertion   ? ?ASSESSMENT ? ?Recent Results: ?The most recent result is correlated with 30 mg per week: ? ?Lab Results  ?Component Value Date  ? INR 3.9 (A) 08/31/2021  ? INR 3.4 (A) 08/10/2021  ? INR 3.4 (A) 07/20/2021  ? ? ?  Anticoagulation Dosing: ?Description   ?INR above goal. HOLD today and then decrease weekly dose to 2.5 mg every Mon, Wed, Fri and 5 mg all other days. Recheck INR in 3 weeks.  ?  ?  ?INR today: Supratherapeutic. INR continue to remain elevated on current weekly dose. Recent decreased Vit K intake from previous baseline intake, could be contributing to recent supratheraputic INR readings. Pt planing on staying consistent with Vit K intake of 3 servings of High Vit K/week moving forward. Denies any other relevant changes in diet, medications, or lifestyles. Denies any bleeding or bruising symptoms. Will increase weekly dose and monitor and follow up closely.  ? ?PLAN ?Weekly dose was decreased by 8.3% to 27.5 mg/week. HOLD today and then decrease weekly dose to 2.5 mg every Mon, Wed, Fri and 5 mg all other  days. Recheck INR in 3 weeks.  ? ?Patient Instructions  ?INR above goal. HOLD today and then decrease weekly dose to 2.5 mg every Mon, Wed, Fri and 5 mg all other days. Recheck INR in 3 weeks.  ?Patient advised to contact clinic or seek medical attention if signs/symptoms of bleeding or thromboembolism occur. ? ?Patient verbalized understanding by repeating back information and was advised to contact me if further medication-related questions arise.  ? ?Follow-up ?Return in about 25 days (around 09/25/2021). ? ?Alysia Penna, PharmD ? ?15 minutes spent face-to-face with the patient during the encounter. 50% of time spent on education, including signs/sx bleeding and clotting, as well as food and drug interactions with warfarin. 50% of time was spent on fingerprick POC INR sample collection,processing, results determination, and documentation ?

## 2021-08-31 NOTE — Patient Instructions (Signed)
INR above goal. HOLD today and then decrease weekly dose to 2.5 mg every Mon, Wed, Fri and 5 mg all other days. Recheck INR in 3 weeks.  ?

## 2021-09-06 ENCOUNTER — Other Ambulatory Visit: Payer: Self-pay | Admitting: Student

## 2021-09-21 ENCOUNTER — Other Ambulatory Visit: Payer: Self-pay

## 2021-09-21 ENCOUNTER — Inpatient Hospital Stay: Payer: Medicare Other | Attending: Hematology and Oncology | Admitting: Hematology and Oncology

## 2021-09-21 ENCOUNTER — Inpatient Hospital Stay: Payer: Medicare Other

## 2021-09-21 VITALS — BP 105/67 | HR 48 | Temp 97.9°F | Resp 18 | Ht 71.0 in | Wt 263.7 lb

## 2021-09-21 DIAGNOSIS — N183 Chronic kidney disease, stage 3 unspecified: Secondary | ICD-10-CM | POA: Diagnosis not present

## 2021-09-21 DIAGNOSIS — Z7901 Long term (current) use of anticoagulants: Secondary | ICD-10-CM | POA: Diagnosis not present

## 2021-09-21 DIAGNOSIS — Z87891 Personal history of nicotine dependence: Secondary | ICD-10-CM | POA: Diagnosis not present

## 2021-09-21 DIAGNOSIS — Z7984 Long term (current) use of oral hypoglycemic drugs: Secondary | ICD-10-CM | POA: Diagnosis not present

## 2021-09-21 DIAGNOSIS — D472 Monoclonal gammopathy: Secondary | ICD-10-CM

## 2021-09-21 DIAGNOSIS — I129 Hypertensive chronic kidney disease with stage 1 through stage 4 chronic kidney disease, or unspecified chronic kidney disease: Secondary | ICD-10-CM | POA: Diagnosis not present

## 2021-09-21 DIAGNOSIS — Z79899 Other long term (current) drug therapy: Secondary | ICD-10-CM | POA: Insufficient documentation

## 2021-09-21 DIAGNOSIS — I4891 Unspecified atrial fibrillation: Secondary | ICD-10-CM | POA: Insufficient documentation

## 2021-09-21 DIAGNOSIS — D649 Anemia, unspecified: Secondary | ICD-10-CM | POA: Insufficient documentation

## 2021-09-21 DIAGNOSIS — E1122 Type 2 diabetes mellitus with diabetic chronic kidney disease: Secondary | ICD-10-CM | POA: Diagnosis not present

## 2021-09-21 LAB — CBC WITH DIFFERENTIAL/PLATELET
Abs Immature Granulocytes: 0.02 10*3/uL (ref 0.00–0.07)
Basophils Absolute: 0.1 10*3/uL (ref 0.0–0.1)
Basophils Relative: 1 %
Eosinophils Absolute: 0.3 10*3/uL (ref 0.0–0.5)
Eosinophils Relative: 4 %
HCT: 41.3 % (ref 39.0–52.0)
Hemoglobin: 13.2 g/dL (ref 13.0–17.0)
Immature Granulocytes: 0 %
Lymphocytes Relative: 26 %
Lymphs Abs: 1.7 10*3/uL (ref 0.7–4.0)
MCH: 29.9 pg (ref 26.0–34.0)
MCHC: 32 g/dL (ref 30.0–36.0)
MCV: 93.7 fL (ref 80.0–100.0)
Monocytes Absolute: 0.6 10*3/uL (ref 0.1–1.0)
Monocytes Relative: 9 %
Neutro Abs: 3.9 10*3/uL (ref 1.7–7.7)
Neutrophils Relative %: 60 %
Platelets: 179 10*3/uL (ref 150–400)
RBC: 4.41 MIL/uL (ref 4.22–5.81)
RDW: 14.2 % (ref 11.5–15.5)
WBC: 6.5 10*3/uL (ref 4.0–10.5)
nRBC: 0 % (ref 0.0–0.2)

## 2021-09-21 LAB — COMPREHENSIVE METABOLIC PANEL
ALT: 13 U/L (ref 0–44)
AST: 13 U/L — ABNORMAL LOW (ref 15–41)
Albumin: 3.9 g/dL (ref 3.5–5.0)
Alkaline Phosphatase: 99 U/L (ref 38–126)
Anion gap: 8 (ref 5–15)
BUN: 18 mg/dL (ref 8–23)
CO2: 28 mmol/L (ref 22–32)
Calcium: 9.3 mg/dL (ref 8.9–10.3)
Chloride: 105 mmol/L (ref 98–111)
Creatinine, Ser: 1.79 mg/dL — ABNORMAL HIGH (ref 0.61–1.24)
GFR, Estimated: 39 mL/min — ABNORMAL LOW (ref >60)
Glucose, Bld: 97 mg/dL (ref 70–99)
Potassium: 4.1 mmol/L (ref 3.5–5.1)
Sodium: 141 mmol/L (ref 135–145)
Total Bilirubin: 0.8 mg/dL (ref 0.3–1.2)
Total Protein: 7.4 g/dL (ref 6.5–8.1)

## 2021-09-21 NOTE — Progress Notes (Signed)
[place] ?CONSULT NOTE ? ?Patient Care Team: ?Sonia Side., FNP as PCP - General (Family Medicine) ? ?CHIEF COMPLAINTS/PURPOSE OF CONSULTATION:  ?Abnormal labs. ? ?ASSESSMENT & PLAN:  ? ?Light chain MGUS ? ?This is a very pleasant 76 year old male patient with past medical history significant for diabetes, hypertension, atrial fibrillation, chronic kidney disease here for FU of light chain MGUS. ?BMB with no evidence of MM. Bone survey March 2022 negative for malignancy. ?He is here for his annual follow up. ?Since last yr, no concerning ROS ?PE unremarkable, no lymphadenopathy or hepatosplenomegaly. No bone lesions. ?Labs today CBC normal. ?SPEP pending, K/L pending at the time of my note. ?If no change in MM labs, he will RTC in one yr. ? ?2. CKD stage III ?Unrelated to the MGUS. ?He should continue FU with his nephrologist. ? ?No orders of the defined types were placed in this encounter. ? ? ? ?HISTORY OF PRESENTING ILLNESS:  ?Gregory [name] 76 y.o. male is here because of some abnormal labs, specifically increased free light chains. ? ?This is a very pleasant 76 year old male patient with past medical history significant for type 2 diabetes mellitus, hypertension, atrial fibrillation on anticoagulation who presented to hematology for worsening chronic kidney disease and elevated kappa light chain ratio.   ? ?Interim History ? ?Since last visit, he is doing remarkably well. ?NO B symptoms ?No new bone pains. ?No change in breathing/bowel habits or urinary habits ?No new neurological complaints. ? ?SOCIAL HISTORY ? ?He retired from [employer] company.   ?He quit smoking in 2005.  ? ?Rest of the pertinent 10 point ROS as mentioned below and negative ? ?REVIEW OF SYSTEMS:   ?Constitutional: Denies fevers, chills or abnormal night sweats ?Eyes: Denies blurriness of vision, double vision or watery eyes ?Ears, nose, mouth, throat, and face: Denies mucositis or sore throat ?Respiratory: Denies  cough, dyspnea or wheezes ?Cardiovascular: Denies palpitation, chest discomfort or lower extremity swelling ?Gastrointestinal:  Denies nausea, heartburn or change in bowel habits ?Skin: Denies abnormal skin rashes ?Lymphatics: Denies new lymphadenopathy or easy bruising ?Neurological:Denies numbness, tingling or new weaknesses ?Behavioral/Psych: Mood is stable, no new changes  ?All other systems were reviewed with the patient and are negative. ? ?MEDICAL HISTORY:  ?Past Medical History:  ?Diagnosis Date  ? CAD (coronary artery disease)   ? COPD (chronic obstructive pulmonary disease) ([place])   ? Diabetes mellitus   ? Heart attack ([place])   ? Hyperlipidemia   ? Hypertension   ? Neuromuscular disorder ([place])   ? SOB (shortness of breath) on exertion   ? ? ?SURGICAL HISTORY: ?Past Surgical History:  ?Procedure Laterality Date  ? BACK SURGERY    ? CARDIAC CATHETERIZATION    ? CORONARY STENT PLACEMENT    ? KNEE ARTHROPLASTY    ? LEFT HEART CATH AND CORONARY ANGIOGRAPHY N/A 03/19/2019  ? Procedure: LEFT HEART CATH AND CORONARY ANGIOGRAPHY;  Surgeon: Nigel Mormon, MD;  Location: [facility];  Service: Cardiovascular;  Laterality: N/A;  ? LEFT HEART CATHETERIZATION WITH CORONARY ANGIOGRAM N/A 03/03/2012  ? Procedure: LEFT HEART CATHETERIZATION WITH CORONARY ANGIOGRAM;  Surgeon: Laverda Page, MD;  Location: [facility] CATH LAB;  Service: Cardiovascular;  Laterality: N/A;  ? PERCUTANEOUS CORONARY STENT INTERVENTION (PCI-S)  03/03/2012  ? Procedure: PERCUTANEOUS CORONARY STENT INTERVENTION (PCI-S);  Surgeon: Laverda Page, MD;  Location: [facility] CATH LAB;  Service: Cardiovascular;;  ? ? ?SOCIAL HISTORY: ?Social History  ? ?Socioeconomic History  ? Marital status:  Married  ?  Spouse name: Not on file  ? Number of children: 3  ? Years of education: Not on file  ? Highest education level: Not on file  ?Occupational History  ? Occupation: retired  ?Tobacco Use  ? Smoking status: Former  ?  Packs/day: 1.00  ?  Years: 30.00  ?   Pack years: 30.00  ?  Types: Cigarettes  ?  Quit date: 07/30/2003  ?  Years since quitting: 18.1  ? Smokeless tobacco: Never  ?Vaping Use  ? Vaping Use: Never used  ?Substance and Sexual Activity  ? Alcohol use: Yes  ?  Alcohol/week: 0.0 standard drinks  ?  Comment: wine rarely  ? Drug use: No  ? Sexual activity: Not on file  ?Other Topics Concern  ? Not on file  ?Social History Narrative  ? Not on file  ? ?Social Determinants of Health  ? ?Financial Resource Strain: Not on file  ?Food Insecurity: Not on file  ?Transportation Needs: Not on file  ?Physical Activity: Not on file  ?Stress: Not on file  ?Social Connections: Not on file  ?Intimate Partner Violence: Not on file  ? ? ?FAMILY HISTORY: ?Family History  ?Problem Relation Age of Onset  ? Hypertension Father   ? Hyperlipidemia Father   ? Stroke Father   ? Hypertension Paternal Grandfather   ? Hyperlipidemia Paternal Grandfather   ? Diabetes Other   ?     maternal uncles  ? Diabetes Sister   ? Cancer Mother   ? Colon cancer Neg Hx   ? ? ?ALLERGIES:  has No Known Allergies. ? ?MEDICATIONS:  ?Current Outpatient Medications  ?Medication Sig Dispense Refill  ? allopurinol (ZYLOPRIM) 300 MG tablet Take 300 mg by mouth daily.    ? amLODipine (NORVASC) 5 MG tablet TAKE 1 TABLET BY MOUTH  DAILY 90 tablet 3  ? atorvastatin (LIPITOR) 20 MG tablet TAKE 1 TABLET BY MOUTH ONCE DAILY AT  6PM 90 tablet 1  ? carvedilol (COREG) 25 MG tablet Take 25 mg by mouth 2 (two) times daily.    ? colchicine 0.6 MG tablet Take 1 tablet by mouth as needed. Take 2 tabs initially then take 1 tab in 1 hr for gout attack    ? furosemide (LASIX) 20 MG tablet TAKE 1 TABLET BY MOUTH ONCE DAILY IN THE MORNING 90 tablet 0  ? glipiZIDE (GLUCOTROL) 5 MG tablet Take 2.5 mg by mouth daily before breakfast.    ? hydrALAZINE (APRESOLINE) 100 MG tablet Take 100 mg by mouth 2 (two) times daily.     ? ONETOUCH VERIO test strip 1 each daily.    ? spironolactone (ALDACTONE) 25 MG tablet TAKE 1 TABLET BY MOUTH  ONCE DAILY IN THE MORNING 90 tablet 0  ? Tamsulosin HCl (FLOMAX) 0.4 MG CAPS Take 0.4 mg by mouth daily.    ? valsartan (DIOVAN) 80 MG tablet Take 80 mg by mouth daily.    ? warfarin (COUMADIN) 5 MG tablet Take 1 tablet (5 mg total) by mouth daily. Or as directed by coumadin clinic 100 tablet 2  ? ?No current facility-administered medications for this visit.  ? ? ? ?PHYSICAL EXAMINATION: ?ECOG PERFORMANCE STATUS: 0 - Asymptomatic ? ?Vitals:  ? 09/21/21 1106  ?BP: 105/67  ?Pulse: (!) 48  ?Resp: 18  ?Temp: 97.9 ?F (36.6 ?C)  ?SpO2: 100%  ? ? ?Filed Weights  ? 09/21/21 1106  ?Weight: 263 lb 11.2 oz (119.6 kg)  ? ?Physical Exam ?Constitutional:   ?  Appearance: Normal appearance.  ?HENT:  ?   Head: Normocephalic and atraumatic.  ?Eyes:  ?   Extraocular Movements: Extraocular movements intact.  ?   Pupils: Pupils are equal, round, and reactive to light.  ?Cardiovascular:  ?   Rate and Rhythm: Normal rate and regular rhythm.  ?   Pulses: Normal pulses.  ?   Heart sounds: Normal heart sounds.  ?Pulmonary:  ?   Effort: Pulmonary effort is normal.  ?   Breath sounds: Normal breath sounds.  ?Musculoskeletal:  ?   Cervical back: Normal range of motion and neck supple. No rigidity.  ?Skin: ?   General: Skin is warm and dry.  ?Neurological:  ?   Mental Status: He is alert.  ? ? ? ?LABORATORY DATA:  ?I have reviewed the data as listed ?Lab Results  ?Component Value Date  ? WBC 6.5 09/21/2021  ? HGB 13.2 09/21/2021  ? HCT 41.3 09/21/2021  ? MCV 93.7 09/21/2021  ? PLT 179 09/21/2021  ? ?  Chemistry   ?   ?Component Value Date/Time  ? NA 141 09/21/2021 1055  ? NA 141 09/13/2019 1310  ? K 4.1 09/21/2021 1055  ? CL 105 09/21/2021 1055  ? CO2 28 09/21/2021 1055  ? BUN 18 09/21/2021 1055  ? BUN 29 (H) 09/13/2019 1310  ? CREATININE 1.79 (H) 09/21/2021 1055  ? CREATININE 1.79 (H) 06/01/2020 1158  ?    ?Component Value Date/Time  ? CALCIUM 9.3 09/21/2021 1055  ? ALKPHOS 99 09/21/2021 1055  ? AST 13 (L) 09/21/2021 1055  ? AST 11 (L)  06/01/2020 1158  ? ALT 13 09/21/2021 1055  ? ALT 9 06/01/2020 1158  ? BILITOT 0.8 09/21/2021 1055  ? BILITOT 0.8 06/01/2020 1158  ?  ? ? ?CMP with normal calcium, normal total protein and normal alk phos. ? ? ? ?

## 2021-09-22 LAB — IGG, IGA, IGM
IgA: 418 mg/dL (ref 61–437)
IgG (Immunoglobin G), Serum: 1159 mg/dL (ref 603–1613)
IgM (Immunoglobulin M), Srm: 20 mg/dL (ref 15–143)

## 2021-09-24 LAB — KAPPA/LAMBDA LIGHT CHAINS
Kappa free light chain: 106.7 mg/L — ABNORMAL HIGH (ref 3.3–19.4)
Kappa, lambda light chain ratio: 5.86 — ABNORMAL HIGH (ref 0.26–1.65)
Lambda free light chains: 18.2 mg/L (ref 5.7–26.3)

## 2021-09-24 LAB — PROTEIN ELECTROPHORESIS, SERUM, WITH REFLEX
A/G Ratio: 1.1 (ref 0.7–1.7)
Albumin ELP: 3.6 g/dL (ref 2.9–4.4)
Alpha-1-Globulin: 0.2 g/dL (ref 0.0–0.4)
Alpha-2-Globulin: 0.8 g/dL (ref 0.4–1.0)
Beta Globulin: 1.1 g/dL (ref 0.7–1.3)
Gamma Globulin: 1.1 g/dL (ref 0.4–1.8)
Globulin, Total: 3.2 g/dL (ref 2.2–3.9)
Total Protein ELP: 6.8 g/dL (ref 6.0–8.5)

## 2021-09-25 ENCOUNTER — Ambulatory Visit: Payer: Medicare Other | Admitting: Pharmacist

## 2021-09-25 DIAGNOSIS — Z5181 Encounter for therapeutic drug level monitoring: Secondary | ICD-10-CM

## 2021-09-25 DIAGNOSIS — I4821 Permanent atrial fibrillation: Secondary | ICD-10-CM

## 2021-09-25 LAB — POCT INR: INR: 2 (ref 2.0–3.0)

## 2021-09-25 NOTE — Progress Notes (Signed)
External labs 09/25/2021:  ?Sodium 141, potassium 4.1, BUN 18, creatinine 1.79, GFR 39 ?Hgb 13.2, HCT 41.3, MCV 39.7, platelet 179 ?

## 2021-09-25 NOTE — Progress Notes (Signed)
Anticoagulation Management ?Gregory [name] is a 76 y.o. male who reports to the clinic for monitoring of warfarin treatment.   ? ?Indication: atrial fibrillation CHA2DS2 Vasc Score 4 (Age 65-74, HTN hx, DM Hx, Vascular disease hx) , HAS-BLED 1 (age>65)  ?Duration: indefinite ?Supervising physician: Adrian Prows ? ?Anticoagulation Clinic Visit History: ? ?Patient does not report signs/symptoms of bleeding or thromboembolism  ? ?Other recent changes: No changes in diet, lifestyle. ? ?Pt staying consistent with 2-3 servings of Vit K servings/week.  ? ?Pt with upcoming dentist appt on 10/21/21 for dental filling procedure. Pt with recent labs through PCP and hem/onc. Showed improvement in his Hbg/Hct. Continues to be negative for any MGUS malignancy concerns.  ? ?Pt reports that he never decreased his warfarin dose as discussed at last INR check and had been taking 2.5 mg every Wed, Fri and 5 mg all other days, instead of previously discussed 2.5 mg Mon, Wed, Fri and 5 mg all other days.  ? ?Anticoagulation Episode Summary   ? ? Current INR goal:  2.0-3.0  ?TTR:  58.4 % (2.3 y)  ?Next INR check:  10/23/2021  ?INR from last check:  2.0 (09/25/2021)  ?Weekly max warfarin dose:    ?Target end date:  Indefinite  ?INR check location:    ?Preferred lab:    ?Send INR reminders to:    ? Indications   ?Atrial fibrillation ([place]) [I48.91] ?Monitoring for long-term anticoagulant use [Z51.81 ?Z79.01] ? ?  ?  ? ? Comments:    ?  ? ?  ? ?No Known Allergies ? ?Current Outpatient Medications:  ?  allopurinol (ZYLOPRIM) 300 MG tablet, Take 300 mg by mouth daily., Disp: , Rfl:  ?  amLODipine (NORVASC) 5 MG tablet, TAKE 1 TABLET BY MOUTH  DAILY, Disp: 90 tablet, Rfl: 3 ?  atorvastatin (LIPITOR) 20 MG tablet, TAKE 1 TABLET BY MOUTH ONCE DAILY AT  6PM, Disp: 90 tablet, Rfl: 1 ?  carvedilol (COREG) 25 MG tablet, Take 25 mg by mouth 2 (two) times daily., Disp: , Rfl:  ?  colchicine 0.6 MG tablet, Take 1 tablet by mouth as needed. Take 2 tabs initially  then take 1 tab in 1 hr for gout attack, Disp: , Rfl:  ?  furosemide (LASIX) 20 MG tablet, TAKE 1 TABLET BY MOUTH ONCE DAILY IN THE MORNING, Disp: 90 tablet, Rfl: 0 ?  glipiZIDE (GLUCOTROL) 5 MG tablet, Take 2.5 mg by mouth daily before breakfast., Disp: , Rfl:  ?  hydrALAZINE (APRESOLINE) 100 MG tablet, Take 100 mg by mouth 2 (two) times daily. , Disp: , Rfl:  ?  ONETOUCH VERIO test strip, 1 each daily., Disp: , Rfl:  ?  spironolactone (ALDACTONE) 25 MG tablet, TAKE 1 TABLET BY MOUTH ONCE DAILY IN THE MORNING, Disp: 90 tablet, Rfl: 0 ?  Tamsulosin HCl (FLOMAX) 0.4 MG CAPS, Take 0.4 mg by mouth daily., Disp: , Rfl:  ?  valsartan (DIOVAN) 80 MG tablet, Take 80 mg by mouth daily., Disp: , Rfl:  ?  warfarin (COUMADIN) 5 MG tablet, Take 1 tablet (5 mg total) by mouth daily. Or as directed by coumadin clinic, Disp: 100 tablet, Rfl: 2 ?Past Medical History:  ?Diagnosis Date  ? CAD (coronary artery disease)   ? COPD (chronic obstructive pulmonary disease) ([place])   ? Diabetes mellitus   ? Heart attack ([place])   ? Hyperlipidemia   ? Hypertension   ? Neuromuscular disorder ([place])   ? SOB (shortness of breath) on exertion   ? ?  ASSESSMENT ? ?Recent Results: ?The most recent result is correlated with 30 mg per week: ? ?Lab Results  ?Component Value Date  ? INR 2.0 09/25/2021  ? INR 3.9 (A) 08/31/2021  ? INR 3.4 (A) 08/10/2021  ? ? ?Anticoagulation Dosing: ?Description   ?INR at goal. Retry taking 2.5 mg every Wed, Fri and 5 mg all other days. Recheck INR in 4 weeks.  ?  ?  ?INR today: Therapeutic. INR improved following recent increased Vit K intake and taking previously stable weekly dose. Weekly dose last week of 30 mg/week. Pt planing on staying consistent with Vit K intake of 3 servings of High Vit K/week moving forward. Denies any other relevant changes in diet, medications, or lifestyles. Denies any bleeding or bruising symptoms. Will retry current weekly dose and continue monitoring and follow up closely. .  ? ?PLAN ?Weekly  dose was increased by 9.1% to 30 mg/week. Retry current weekly dose of 2.5 mg every Wed, Fri and 5 mg all other days. Recheck INR in 4 weeks.  ? ?Patient Instructions  ?INR at goal. Retry taking 2.5 mg every Wed, Fri and 5 mg all other days. Recheck INR in 4 weeks.  ?Patient advised to contact clinic or seek medical attention if signs/symptoms of bleeding or thromboembolism occur. ? ?Patient verbalized understanding by repeating back information and was advised to contact me if further medication-related questions arise.  ? ?Follow-up ?Return in about 4 weeks (around 10/23/2021). ? ?Alysia Penna, PharmD ? ?15 minutes spent face-to-face with the patient during the encounter. 50% of time spent on education, including signs/sx bleeding and clotting, as well as food and drug interactions with warfarin. 50% of time was spent on fingerprick POC INR sample collection,processing, results determination, and documentation ?

## 2021-09-25 NOTE — Patient Instructions (Signed)
INR at goal. Retry taking 2.5 mg every Wed, Fri and 5 mg all other days. Recheck INR in 4 weeks.  ?

## 2021-10-23 ENCOUNTER — Ambulatory Visit: Payer: Medicare Other | Admitting: Pharmacist

## 2021-10-23 DIAGNOSIS — I4821 Permanent atrial fibrillation: Secondary | ICD-10-CM

## 2021-10-23 DIAGNOSIS — Z5181 Encounter for therapeutic drug level monitoring: Secondary | ICD-10-CM

## 2021-10-23 LAB — POCT INR: INR: 3.8 — AB (ref 2.0–3.0)

## 2021-10-23 MED ORDER — VALSARTAN 80 MG PO TABS: 80.0000 mg | ORAL_TABLET | Freq: Every day | ORAL | 1 refills | Status: DC

## 2021-10-23 NOTE — Progress Notes (Signed)
Anticoagulation Management ?Gregory [name] is a 76 y.o. male who reports to the clinic for monitoring of warfarin treatment.   ? ?Indication: atrial fibrillation CHA2DS2 Vasc Score 4 (Age 73-74, HTN hx, DM Hx, Vascular disease hx) , HAS-BLED 1 (age>65)  ?Duration: indefinite ?Supervising physician: Adrian Prows ? ?Anticoagulation Clinic Visit History: ? ?Patient does not report signs/symptoms of bleeding or thromboembolism  ? ?Other recent changes: No changes in diet, lifestyle. ? ?Pt staying consistent with 2-3 servings of Vit K servings/week.  ? ?Pt reports that he had lower than previously discussed baseline Vit K intake over the past week.  ? ?Pt is being screened for OCEANIC-AF trial (Eliquis vs. Asundexian in pt with Adib at risk of stroke). Will transition from warfarin to study drug at next INR check to ensure INR <2 prior to the switch.  ? ?Anticoagulation Episode Summary   ? ? Current INR goal:  2.0-3.0  ?TTR:  58.3 % (2.4 y)  ?Next INR check:  10/26/2021  ?INR from last check:  3.8 (10/23/2021)  ?Weekly max warfarin dose:    ?Target end date:  Indefinite  ?INR check location:    ?Preferred lab:    ?Send INR reminders to:    ? Indications   ?Atrial fibrillation ([place]) [I48.91] ?Monitoring for long-term anticoagulant use [Z51.81 ?Z79.01] ? ?  ?  ? ? Comments:    ?  ? ?  ? ?No Known Allergies ? ?Current Outpatient Medications:  ?  allopurinol (ZYLOPRIM) 300 MG tablet, Take 300 mg by mouth daily., Disp: , Rfl:  ?  amLODipine (NORVASC) 5 MG tablet, TAKE 1 TABLET BY MOUTH  DAILY, Disp: 90 tablet, Rfl: 3 ?  atorvastatin (LIPITOR) 20 MG tablet, TAKE 1 TABLET BY MOUTH ONCE DAILY AT  6PM, Disp: 90 tablet, Rfl: 1 ?  carvedilol (COREG) 25 MG tablet, Take 25 mg by mouth 2 (two) times daily., Disp: , Rfl:  ?  colchicine 0.6 MG tablet, Take 1 tablet by mouth as needed. Take 2 tabs initially then take 1 tab in 1 hr for gout attack, Disp: , Rfl:  ?  furosemide (LASIX) 20 MG tablet, TAKE 1 TABLET BY MOUTH ONCE DAILY IN THE MORNING,  Disp: 90 tablet, Rfl: 0 ?  glipiZIDE (GLUCOTROL) 5 MG tablet, Take 2.5 mg by mouth daily before breakfast., Disp: , Rfl:  ?  hydrALAZINE (APRESOLINE) 100 MG tablet, Take 100 mg by mouth 2 (two) times daily. , Disp: , Rfl:  ?  ONETOUCH VERIO test strip, 1 each daily., Disp: , Rfl:  ?  spironolactone (ALDACTONE) 25 MG tablet, TAKE 1 TABLET BY MOUTH ONCE DAILY IN THE MORNING, Disp: 90 tablet, Rfl: 0 ?  Tamsulosin HCl (FLOMAX) 0.4 MG CAPS, Take 0.4 mg by mouth daily., Disp: , Rfl:  ?  valsartan (DIOVAN) 80 MG tablet, Take 1 tablet (80 mg total) by mouth daily., Disp: 30 tablet, Rfl: 1 ?  warfarin (COUMADIN) 5 MG tablet, Take 1 tablet (5 mg total) by mouth daily. Or as directed by coumadin clinic, Disp: 100 tablet, Rfl: 2 ?Past Medical History:  ?Diagnosis Date  ? CAD (coronary artery disease)   ? COPD (chronic obstructive pulmonary disease) ([place])   ? Diabetes mellitus   ? Heart attack ([place])   ? Hyperlipidemia   ? Hypertension   ? Neuromuscular disorder ([place])   ? SOB (shortness of breath) on exertion   ? ?ASSESSMENT ? ?Recent Results: ?The most recent result is correlated with 30 mg per week: ? ?Lab Results  ?  Component Value Date  ? INR 3.8 (A) 10/23/2021  ? INR 2.0 09/25/2021  ? INR 3.9 (A) 08/31/2021  ? ? ?Anticoagulation Dosing: ?Description   ?INR above goal. HOLD warfarin for 3 days. Recheck INR in 3 days.  ?  ?  ?INR today: Supratherapeutic.INR elevated in setting of lower than baseline Vit K intake. Denies any complains of unusual bleeding or bruising intake. Pt verbalized compliance to previously discussed warfarin dose. Denies any relevant recent changes in pt's medications, diet, or lifestyle. Will have pt hold warfarin and transition pt over to the study medication Need to ensure INR <2 prior to the switch.  ? ?PLAN ?Weekly dose was unchanged by 0% to 30 mg/week. HOLD warfarin for 3 days. Recheck INR in 3 days.   ? ?Patient Instructions  ?INR above goal. HOLD warfarin for 3 days. Recheck INR in 3 days.   ?Patient advised to contact clinic or seek medical attention if signs/symptoms of bleeding or thromboembolism occur. ? ?Patient verbalized understanding by repeating back information and was advised to contact me if further medication-related questions arise.  ? ?Follow-up ?Return in about 3 days (around 10/26/2021). ? ?Alysia Penna, PharmD ? ?15 minutes spent face-to-face with the patient during the encounter. 50% of time spent on education, including signs/sx bleeding and clotting, as well as food and drug interactions with warfarin. 50% of time was spent on fingerprick POC INR sample collection,processing, results determination, and documentation ?

## 2021-10-23 NOTE — Patient Instructions (Signed)
INR above goal. HOLD warfarin for 3 days. Recheck INR in 3 days.  ?

## 2021-10-26 ENCOUNTER — Ambulatory Visit: Payer: Medicare Other | Admitting: Pharmacist

## 2021-10-26 DIAGNOSIS — I4821 Permanent atrial fibrillation: Secondary | ICD-10-CM

## 2021-10-26 DIAGNOSIS — Z5181 Encounter for therapeutic drug level monitoring: Secondary | ICD-10-CM

## 2021-10-26 LAB — POCT INR: INR: 2.6 (ref 2.0–3.0)

## 2021-10-26 NOTE — Patient Instructions (Signed)
INR at goal. Continue to hold warfarin. Recheck INR in 3 days.  ?

## 2021-10-26 NOTE — Progress Notes (Signed)
Anticoagulation Management ?Gregory [name] is a 76 y.o. male who reports to the clinic for monitoring of warfarin treatment.   ? ?Indication: atrial fibrillation CHA2DS2 Vasc Score 4 (Age 43-74, HTN hx, DM Hx, Vascular disease hx) , HAS-BLED 1 (age>65)  ?Duration: indefinite ?Supervising physician: Adrian Prows ? ?Anticoagulation Clinic Visit History: ? ?Patient does not report signs/symptoms of bleeding or thromboembolism  ? ?Other recent changes: No changes in diet, lifestyle. ? ?Pt staying consistent with 2-3 servings of Vit K servings/week.  ? ?Pt is being screened for OCEANIC-AF trial (Eliquis vs. Asundexian in pt with Adib at risk of stroke). Will transition from warfarin to study drug/eliquis at next INR check to ensure INR <2 prior to the switch.  ? ?Anticoagulation Episode Summary   ? ? Current INR goal:  2.0-3.0  ?TTR:  58.2 % (2.4 y)  ?Next INR check:  10/29/2021  ?INR from last check:  2.6 (10/26/2021)  ?Weekly max warfarin dose:    ?Target end date:  Indefinite  ?INR check location:    ?Preferred lab:    ?Send INR reminders to:    ? Indications   ?Atrial fibrillation ([place]) [I48.91] ?Monitoring for long-term anticoagulant use [Z51.81 ?Z79.01] ? ?  ?  ? ? Comments:    ?  ? ?  ? ?No Known Allergies ? ?Current Outpatient Medications:  ?  allopurinol (ZYLOPRIM) 300 MG tablet, Take 300 mg by mouth daily., Disp: , Rfl:  ?  amLODipine (NORVASC) 5 MG tablet, TAKE 1 TABLET BY MOUTH  DAILY, Disp: 90 tablet, Rfl: 3 ?  atorvastatin (LIPITOR) 20 MG tablet, TAKE 1 TABLET BY MOUTH ONCE DAILY AT  6PM, Disp: 90 tablet, Rfl: 1 ?  carvedilol (COREG) 25 MG tablet, Take 25 mg by mouth 2 (two) times daily., Disp: , Rfl:  ?  colchicine 0.6 MG tablet, Take 1 tablet by mouth as needed. Take 2 tabs initially then take 1 tab in 1 hr for gout attack, Disp: , Rfl:  ?  furosemide (LASIX) 20 MG tablet, TAKE 1 TABLET BY MOUTH ONCE DAILY IN THE MORNING, Disp: 90 tablet, Rfl: 0 ?  glipiZIDE (GLUCOTROL) 5 MG tablet, Take 2.5 mg by mouth daily  before breakfast., Disp: , Rfl:  ?  hydrALAZINE (APRESOLINE) 100 MG tablet, Take 100 mg by mouth 2 (two) times daily. , Disp: , Rfl:  ?  ONETOUCH VERIO test strip, 1 each daily., Disp: , Rfl:  ?  spironolactone (ALDACTONE) 25 MG tablet, TAKE 1 TABLET BY MOUTH ONCE DAILY IN THE MORNING, Disp: 90 tablet, Rfl: 0 ?  Tamsulosin HCl (FLOMAX) 0.4 MG CAPS, Take 0.4 mg by mouth daily., Disp: , Rfl:  ?  valsartan (DIOVAN) 80 MG tablet, Take 1 tablet (80 mg total) by mouth daily., Disp: 30 tablet, Rfl: 1 ?  warfarin (COUMADIN) 5 MG tablet, Take 1 tablet (5 mg total) by mouth daily. Or as directed by coumadin clinic, Disp: 100 tablet, Rfl: 2 ?Past Medical History:  ?Diagnosis Date  ? CAD (coronary artery disease)   ? COPD (chronic obstructive pulmonary disease) ([place])   ? Diabetes mellitus   ? Heart attack ([place])   ? Hyperlipidemia   ? Hypertension   ? Neuromuscular disorder ([place])   ? SOB (shortness of breath) on exertion   ? ?ASSESSMENT ? ?Recent Results: ?The most recent result is correlated with 17.5 mg per week: ? ?Lab Results  ?Component Value Date  ? INR 2.6 10/26/2021  ? INR 3.8 (A) 10/23/2021  ? INR 2.0 09/25/2021  ? ? ?  Anticoagulation Dosing: ?Description   ?INR at goal. Continue to hold warfarin. Recheck INR in 3 days.  ?  ?  ?INR today: Therapeutic. INR trending down following holding pt's current warfarin dose as directed. Pt currently being screened for the OCEANIC-AF trial and will need his INR  to be <2 prior to starting study medications. Pt will be transitioned to Eliquis 5 mg BID while pt is being screened. Will have pt return in 3 days.  ? ?PLAN ?Weekly dose was unchanged by 0% to 0 mg/week. Continue holding warfarin as directed. Recheck INR in 3 days.   ? ?Patient Instructions  ?INR at goal. Continue to hold warfarin. Recheck INR in 3 days.  ?Patient advised to contact clinic or seek medical attention if signs/symptoms of bleeding or thromboembolism occur. ? ?Patient verbalized understanding by repeating back  information and was advised to contact me if further medication-related questions arise.  ? ?Follow-up ?Return in about 3 days (around 10/29/2021). ? ?Alysia Penna, PharmD ? ?15 minutes spent face-to-face with the patient during the encounter. 50% of time spent on education, including signs/sx bleeding and clotting, as well as food and drug interactions with warfarin. 50% of time was spent on fingerprick POC INR sample collection,processing, results determination, and documentation ?

## 2021-10-29 ENCOUNTER — Ambulatory Visit: Payer: Medicare Other | Admitting: Pharmacist

## 2021-10-29 ENCOUNTER — Other Ambulatory Visit: Payer: Self-pay | Admitting: Pharmacist

## 2021-10-29 DIAGNOSIS — I4821 Permanent atrial fibrillation: Secondary | ICD-10-CM

## 2021-10-29 DIAGNOSIS — Z5181 Encounter for therapeutic drug level monitoring: Secondary | ICD-10-CM

## 2021-10-29 LAB — POCT INR: INR: 1.6 — AB (ref 2.0–3.0)

## 2021-10-29 MED ORDER — FUROSEMIDE 20 MG PO TABS: 20.0000 mg | ORAL_TABLET | Freq: Every morning | ORAL | 2 refills | Status: DC

## 2021-10-29 NOTE — Patient Instructions (Signed)
INR below 2.0. Start Eliquis 5 mg BID.  ?

## 2021-10-29 NOTE — Progress Notes (Signed)
Anticoagulation Management ?Gregory [name] is a 76 y.o. male who reports to the clinic for monitoring of warfarin treatment.   ? ?Indication: atrial fibrillation CHA2DS2 Vasc Score 4 (Age 37-74, HTN hx, DM Hx, Vascular disease hx) , HAS-BLED 1 (age>65)  ?Duration: indefinite ?Supervising physician: Adrian Prows ? ?Anticoagulation Clinic Visit History: ? ?Patient does not report signs/symptoms of bleeding or thromboembolism  ? ?Other recent changes: No changes in diet, lifestyle. ? ?Pt staying consistent with 2-3 servings of Vit K servings/week.  ? ?Pt is being screened for OCEANIC-AF trial (Eliquis vs. Asundexian in pt with Adib at risk of stroke). Transitioning from warfarin to study drug/eliquis at next INR check to ensure INR <2 prior to the switch. Pt to start out on Eliquis during the screening period.  ? ? ?No Known Allergies ? ?Current Outpatient Medications:  ?  apixaban (ELIQUIS) 5 MG TABS tablet, Take 5 mg by mouth 2 (two) times daily., Disp: , Rfl:  ?  allopurinol (ZYLOPRIM) 300 MG tablet, Take 300 mg by mouth daily., Disp: , Rfl:  ?  amLODipine (NORVASC) 5 MG tablet, TAKE 1 TABLET BY MOUTH  DAILY, Disp: 90 tablet, Rfl: 3 ?  atorvastatin (LIPITOR) 20 MG tablet, TAKE 1 TABLET BY MOUTH ONCE DAILY AT  6PM, Disp: 90 tablet, Rfl: 1 ?  carvedilol (COREG) 25 MG tablet, Take 25 mg by mouth 2 (two) times daily., Disp: , Rfl:  ?  colchicine 0.6 MG tablet, Take 1 tablet by mouth as needed. Take 2 tabs initially then take 1 tab in 1 hr for gout attack, Disp: , Rfl:  ?  furosemide (LASIX) 20 MG tablet, Take 1 tablet (20 mg total) by mouth every morning., Disp: 90 tablet, Rfl: 2 ?  glipiZIDE (GLUCOTROL) 5 MG tablet, Take 2.5 mg by mouth daily before breakfast., Disp: , Rfl:  ?  hydrALAZINE (APRESOLINE) 100 MG tablet, Take 100 mg by mouth 2 (two) times daily. , Disp: , Rfl:  ?  ONETOUCH VERIO test strip, 1 each daily., Disp: , Rfl:  ?  spironolactone (ALDACTONE) 25 MG tablet, TAKE 1 TABLET BY MOUTH ONCE DAILY IN THE  MORNING, Disp: 90 tablet, Rfl: 0 ?  Tamsulosin HCl (FLOMAX) 0.4 MG CAPS, Take 0.4 mg by mouth daily., Disp: , Rfl:  ?  valsartan (DIOVAN) 80 MG tablet, Take 1 tablet (80 mg total) by mouth daily., Disp: 30 tablet, Rfl: 1 ?Past Medical History:  ?Diagnosis Date  ? CAD (coronary artery disease)   ? COPD (chronic obstructive pulmonary disease) ([place])   ? Diabetes mellitus   ? Heart attack ([place])   ? Hyperlipidemia   ? Hypertension   ? Neuromuscular disorder ([place])   ? SOB (shortness of breath) on exertion   ? ?ASSESSMENT ? ?Recent Results: ?The most recent result is correlated with 0 mg per week: ? ?Lab Results  ?Component Value Date  ? INR 1.6 (A) 10/29/2021  ? INR 2.6 10/26/2021  ? INR 3.8 (A) 10/23/2021  ? ? ?Anticoagulation Dosing: ? ?INR today: Subtherapeutic. INR<2 and thus safe to transition to Eliquis. Pt currently being screened for the OCEANIC-AF trial. Pt to start Eliquis 5 mg BID while pt is being screened.  ? ?PLAN ?Weekly dose was unchanged by 0% to 0 mg/week. Stop warfarin. Start Eliquis 5 mg BID.   ? ?Patient Instructions  ?INR below 2.0. Start Eliquis 5 mg BID.  ?Patient advised to contact clinic or seek medical attention if signs/symptoms of bleeding or thromboembolism occur. ? ?Patient verbalized understanding by  repeating back information and was advised to contact me if further medication-related questions arise.  ? ?Follow-up ?No follow-ups on file. ? ?Alysia Penna, PharmD ? ?15 minutes spent face-to-face with the patient during the encounter. 50% of time spent on education, including signs/sx bleeding and clotting, as well as food and drug interactions with warfarin. 50% of time was spent on fingerprick POC INR sample collection,processing, results determination, and documentation ?

## 2021-11-25 ENCOUNTER — Other Ambulatory Visit: Payer: Self-pay | Admitting: Student

## 2021-11-25 DIAGNOSIS — I10 Essential (primary) hypertension: Secondary | ICD-10-CM

## 2021-12-08 ENCOUNTER — Other Ambulatory Visit: Payer: Self-pay | Admitting: Cardiology

## 2022-02-18 ENCOUNTER — Other Ambulatory Visit: Payer: Self-pay

## 2022-02-18 DIAGNOSIS — I10 Essential (primary) hypertension: Secondary | ICD-10-CM

## 2022-02-18 MED ORDER — SPIRONOLACTONE 25 MG PO TABS: 25.0000 mg | ORAL_TABLET | Freq: Every morning | ORAL | 0 refills | Status: DC

## 2022-04-04 ENCOUNTER — Telehealth: Payer: Self-pay

## 2022-04-04 DIAGNOSIS — R0609 Other forms of dyspnea: Secondary | ICD-10-CM

## 2022-04-04 DIAGNOSIS — I428 Other cardiomyopathies: Secondary | ICD-10-CM

## 2022-04-04 DIAGNOSIS — I5033 Acute on chronic diastolic (congestive) heart failure: Secondary | ICD-10-CM

## 2022-04-04 NOTE — Telephone Encounter (Signed)
Continue present dose ans he has remained stable

## 2022-04-04 NOTE — Telephone Encounter (Signed)
Spoke with patient today for check-in. Patient states his nephrologist recommended him to take Lasix 40 mg daily due to swelling in his feet and ankles. Patient did not feel comfortable starting until getting second opinion from cardiology office. If patient is to take 40 mg of Lasix daily. He would like to know how long he should continue taking the increased dose. Patient currently taking 20 mg daily.    Weight Readings: 04/02/2022 Tuesday at 10:27 AM 262.4      04/01/2022 Monday at 10:44 AM 260.2      03/29/2022 Friday at 09:54 AM 262.8      03/26/2022 Tuesday at 09:56 AM 262.8      03/25/2022 Monday at 10:17 AM 263

## 2022-04-05 NOTE — Telephone Encounter (Signed)
Needs OV maybe incident to visit. Arrange with [place] if I am full. For CHF. I am placing orders for blood work prior to his visit.  If possible echo before visit otherwise will still see without echo. OV in 1 week

## 2022-04-05 NOTE — Telephone Encounter (Signed)
ICD-10-CM   1. DOE (dyspnea on exertion)  R06.09 High sensitivity CRP    CMP14+EGFR    Pro b natriuretic peptide (BNP)    PCV ECHOCARDIOGRAM COMPLETE    2. 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

## 2022-04-11 ENCOUNTER — Telehealth: Payer: Self-pay | Admitting: Cardiology

## 2022-04-11 NOTE — Telephone Encounter (Signed)
Oceanic-AF trial medication added to medication list.  Last dose of Eliquis taken on [id number].

## 2022-04-12 ENCOUNTER — Ambulatory Visit: Payer: Medicare Other | Admitting: Cardiology

## 2022-04-12 ENCOUNTER — Encounter: Payer: Self-pay | Admitting: Cardiology

## 2022-04-12 VITALS — BP 105/80 | HR 68 | Temp 97.8°F | Resp 16 | Ht 71.0 in | Wt 270.0 lb

## 2022-04-12 DIAGNOSIS — I10 Essential (primary) hypertension: Secondary | ICD-10-CM

## 2022-04-12 DIAGNOSIS — I5033 Acute on chronic diastolic (congestive) heart failure: Secondary | ICD-10-CM

## 2022-04-12 DIAGNOSIS — I428 Other cardiomyopathies: Secondary | ICD-10-CM

## 2022-04-12 DIAGNOSIS — R0609 Other forms of dyspnea: Secondary | ICD-10-CM

## 2022-04-12 DIAGNOSIS — I4821 Permanent atrial fibrillation: Secondary | ICD-10-CM

## 2022-04-12 LAB — CMP14+EGFR
ALT: 17 IU/L (ref 0–44)
AST: 18 IU/L (ref 0–40)
Albumin/Globulin Ratio: 1.5 (ref 1.2–2.2)
Albumin: 4 g/dL (ref 3.8–4.8)
Alkaline Phosphatase: 126 IU/L — ABNORMAL HIGH (ref 44–121)
BUN/Creatinine Ratio: 16 (ref 10–24)
BUN: 29 mg/dL — ABNORMAL HIGH (ref 8–27)
Bilirubin Total: 0.4 mg/dL (ref 0.0–1.2)
CO2: 16 mmol/L — ABNORMAL LOW (ref 20–29)
Calcium: 9.5 mg/dL (ref 8.6–10.2)
Chloride: 106 mmol/L (ref 96–106)
Creatinine, Ser: 1.77 mg/dL — ABNORMAL HIGH (ref 0.76–1.27)
Globulin, Total: 2.7 g/dL (ref 1.5–4.5)
Glucose: 88 mg/dL (ref 70–99)
Potassium: 3.5 mmol/L (ref 3.5–5.2)
Sodium: 146 mmol/L — ABNORMAL HIGH (ref 134–144)
Total Protein: 6.7 g/dL (ref 6.0–8.5)
eGFR: 39 mL/min/{1.73_m2} — ABNORMAL LOW (ref >59)

## 2022-04-12 LAB — PRO B NATRIURETIC PEPTIDE: NT-Pro BNP: 2421 pg/mL — ABNORMAL HIGH (ref 0–486)

## 2022-04-12 LAB — HIGH SENSITIVITY CRP: CRP, High Sensitivity: 1.34 mg/L (ref 0.00–3.00)

## 2022-04-12 MED ORDER — DAPAGLIFLOZIN PROPANEDIOL 10 MG PO TABS: 10.0000 mg | ORAL_TABLET | Freq: Every day | ORAL | 1 refills | Status: DC

## 2022-04-12 MED ORDER — CARVEDILOL 25 MG PO TABS: 37.5000 mg | ORAL_TABLET | Freq: Two times a day (BID) | ORAL | 3 refills | Status: DC

## 2022-04-12 NOTE — Progress Notes (Incomplete)
Primary Physician/Referring:  Sonia Side., FNP  Patient ID: Gregory [name], male    DOB: 10/04/1945, 76 y.o.   MRN: [id number]  Chief Complaint  Patient presents with  . Coronary Artery Disease  . Shortness of Breath  . Follow-up    1 week  . Leg Swelling   HPI:    Gregory [name]  is a 76 y.o. 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   Patient presents for 28-monthfollow-up.  At last office visit patient was asymptomatic and risk factors were well controlled, therefore no changes were made.  Patient remains relatively asymptomatic.  Denies chest pain, dyspnea, dizziness, syncope, near syncope.  Denies orthopnea, PND.  He does have chronic bilateral lower leg edema for which she wears compression stockings, this remained stable.  He is tolerating anticoagulation without bleeding diathesis.  Patient is currently scheduled to see his PCP for regular follow-up later this week.  I have requested the patient have lab results from this upcoming visit sent to our office for review.  Past Medical History:  Diagnosis Date  . CAD (coronary artery disease)   . COPD (chronic obstructive pulmonary disease) ([place]   . Diabetes mellitus   . Heart attack [place]   . Hyperlipidemia   . Hypertension   . Neuromuscular disorder ([place]   . SOB (shortness of breath) on exertion    Past Surgical History:  Procedure Laterality Date  . BACK SURGERY    . CARDIAC CATHETERIZATION    . CORONARY STENT PLACEMENT    . KNEE ARTHROPLASTY    . LEFT HEART CATH AND CORONARY ANGIOGRAPHY N/A 03/19/2019   Procedure: LEFT HEART CATH AND CORONARY ANGIOGRAPHY;  Surgeon: PNigel Mormon MD;  [facility];  Service: Cardiovascular;  Laterality: N/A;  .  LEFT HEART CATHETERIZATION WITH CORONARY ANGIOGRAM N/A 03/03/2012   Procedure: LEFT HEART CATHETERIZATION WITH CORONARY ANGIOGRAM;  Surgeon: JLaverda Page MD;  [facility];  Service: Cardiovascular;  Laterality: N/A;  . PERCUTANEOUS CORONARY STENT INTERVENTION (PCI-S)  03/03/2012   Procedure: PERCUTANEOUS CORONARY STENT INTERVENTION (PCI-S);  Surgeon: JLaverda Page MD;  [facility];  Service: Cardiovascular;;   Family History  Problem Relation Age of Onset  . Hypertension Father   . Hyperlipidemia Father   . Stroke Father   . Hypertension Paternal Grandfather   . Hyperlipidemia Paternal Grandfather   . Diabetes Other        maternal uncles  . Diabetes Sister   . Cancer Mother   . Colon cancer Neg Hx    Social History   Tobacco Use  . Smoking status: Former    Packs/day: 1.00    Years: 30.00    Total pack years: 30.00    Types: Cigarettes    Quit date: 07/30/2003    Years since quitting: 18.7  . Smokeless tobacco: Never  Substance Use Topics  . Alcohol use: Yes    Alcohol/week: 0.0 standard drinks of alcohol    Comment: wine rarely    Marital Status: Married  ROS  Review of Systems  Constitutional: Negative for malaise/fatigue and weight gain.  Cardiovascular:  Positive for dyspnea on exertion (chronic and stable) and palpitations (stable). Negative for chest pain, claudication, leg swelling, near-syncope, orthopnea, paroxysmal nocturnal dyspnea and  syncope.  Respiratory:  Negative for shortness of breath.   Musculoskeletal:  Positive for back pain (chronic and 3 back operations).  Neurological:  Positive for paresthesias (fingers).   Objective      04/12/2022   11:21 AM 09/21/2021   11:06 AM 05/07/2021    1:09 PM  Vitals with BMI  Height 5' 11"  5' 11"  5' 11"   Weight 270 lbs 263 lbs 11 oz 260 lbs 6 oz  BMI 37.67 08.8 11.03  Systolic 159 458 592  Diastolic 80 67 72  Pulse 68 48 92    Blood pressure 105/80, pulse 68, temperature 97.8 F  (36.6 C), temperature source Temporal, resp. rate 16, height 5' 11"  (1.803 m), weight 270 lb (122.5 kg), SpO2 97 %. Body mass index is 37.66 kg/m.   Physical Exam Vitals reviewed.  Constitutional:      Appearance: He is obese.     Comments: He is well-built and moderately obese in no acute distress.  Cardiovascular:     Rate and Rhythm: Normal rate. Rhythm irregular.     Pulses: Normal pulses and intact distal pulses.     Heart sounds: No murmur heard.    No gallop. No S3 or S4 sounds.     Comments: No JVD.   Pulmonary:     Effort: Pulmonary effort is normal.     Breath sounds: Normal breath sounds.  Abdominal:     Comments: Obese.  Musculoskeletal:     Right lower leg: Edema (trace) present.     Left lower leg: Edema (trace) present.  Neurological:     General: No focal deficit present.     Mental Status: He is alert and oriented to person, place, and time.   Physical exam unchanged compared to previous office visit.  Laboratory examination:   Recent Labs    09/21/21 1055 04/09/22 1454  NA 141 146*  K 4.1 3.5  CL 105 106  CO2 28 16*  GLUCOSE 97 88  BUN 18 29*  CREATININE 1.79* 1.77*  CALCIUM 9.3 9.5  GFRNONAA 39*  --      Chemistry      Component Value Date/Time   NA 146 (H) 04/09/2022 1454   K 3.5 04/09/2022 1454   CL 106 04/09/2022 1454   CO2 16 (L) 04/09/2022 1454   BUN 29 (H) 04/09/2022 1454   CREATININE 1.77 (H) 04/09/2022 1454   CREATININE 1.79 (H) 06/01/2020 1158      Component Value Date/Time   CALCIUM 9.5 04/09/2022 1454   ALKPHOS 126 (H) 04/09/2022 1454   AST 18 04/09/2022 1454   AST 11 (L) 06/01/2020 1158   ALT 17 04/09/2022 1454   ALT 9 06/01/2020 1158   BILITOT 0.4 04/09/2022 1454   BILITOT 0.8 06/01/2020 1158       BNP (last 3 results) No results for input(s): "BNP" in the last 8760 hours.  ProBNP (last 3 results) Recent Labs    04/09/22 1454  PROBNP 2,421*    Component Ref Range & Units 6 mo ago (09/21/21) 1 yr  ago (08/10/20) 1 yr ago (07/26/20) 1 yr ago (06/01/20)  Kappa free light chain 3.3 - 19.4 mg/L 106.7 High   75.4 High   85.8 High   109.4 High    Lambda free light chains 5.7 - 26.3 mg/L 18.2  29.4 High   30.2 High   31.1 High    Kappa, lambda light chain ratio 0.26 - 1.65 5.86 High   2.56 High  CM  2.84 High  CM  3.52 High  CM     External Labs:      Hemoglobin 13.500 g/d 07/26/2020 Platelets 235.000 K/ 07/26/2020  Creatinine, Serum 1.790 mg/ 06/01/2020 Potassium 3.800 mm 06/01/2020 ALT (SGPT) 9.000 U/L 06/01/2020  TSH 4.350 08/23/2019  Lab 01/17/2020:  Serum glucose 109 mg, BUN 55, creatinine 2.6, EGFR 29.3 mL, sodium 140, potassium 4.8.  PSA 0.777 ng/ 08/23/2019  Allergies  No Known Allergies   Medications    Current Outpatient Medications:  .  allopurinol (ZYLOPRIM) 300 MG tablet, Take 300 mg by mouth daily., Disp: , Rfl:  .  apixaban (ELIQUIS) 5 MG TABS tablet, Take 5 mg by mouth 2 (two) times daily., Disp: , Rfl:  .  atorvastatin (LIPITOR) 20 MG tablet, TAKE 1 TABLET BY MOUTH ONCE DAILY AT  6PM, Disp: 90 tablet, Rfl: 1 .  dapagliflozin propanediol (FARXIGA) 10 MG TABS tablet, Take 1 tablet (10 mg total) by mouth daily before breakfast., Disp: 30 tablet, Rfl: 1 .  furosemide (LASIX) 20 MG tablet, Take 1 tablet (20 mg total) by mouth every morning., Disp: 90 tablet, Rfl: 2 .  glipiZIDE (GLUCOTROL) 5 MG tablet, Take 5 mg by mouth daily before breakfast., Disp: , Rfl:  .  hydrALAZINE (APRESOLINE) 100 MG tablet, Take 100 mg by mouth 2 (two) times daily. , Disp: , Rfl:  .  Investigational - Study Medication, Take 1-2 tablets by mouth in the morning and at bedtime. Study name: OCEANIC-AF (Asundexian - factor XIa inhibitor PO QD vs Apixaban PO BID in patients with A. Fib for stroke prevention). Additional study details: Treat study drug like Eliquis., Disp: , Rfl:  .  ONETOUCH VERIO test strip, 1 each daily., Disp: , Rfl:  .  spironolactone (ALDACTONE) 25 MG tablet, Take 1 tablet (25 mg  total) by mouth every morning., Disp: 90 tablet, Rfl: 0 .  Tamsulosin HCl (FLOMAX) 0.4 MG CAPS, Take 0.4 mg by mouth daily., Disp: , Rfl:  .  valsartan (DIOVAN) 80 MG tablet, Take 1 tablet (80 mg total) by mouth daily., Disp: 30 tablet, Rfl: 1 .  carvedilol (COREG) 25 MG tablet, Take 1.5 tablets (37.5 mg total) by mouth 2 (two) times daily., Disp: 270 tablet, Rfl: 3  Radiology:   No results found.  Cardiac Studies:   Coronary Angiography 03/19/2019:  No change from 03/06/2012: LM: Normal LAD: Mid LAD focal 40% stenosis          Ostial diag-2 80% stenosis with TIMI III flow. Unchanged compared to 2013 angiography. Patent mid Diag 2 stent without ISR LCx: Patent mid LCX overlapping stents with 10-20% late lumen loss RCA: CTO Mid 100% occlusion with grade 3 left-to-right collaterals.     Echocardiogram 09/22/2019:  Left ventricle cavity is normal in size. Mild concentric hypertrophy of the left ventricle. Normal global wall motion. Mildly depressed LV systolic function with visual EF 40-45%. Could not evaluate diastolic function, patient probably in Afib.  Left atrial cavity is severely dilated. LA vol index 67 ml/m2. Trileaflet aortic valve. Trace aortic stenosis. Trace aortic regurgitation. Mild aortic valve leaflet calcification.  Moderate (Grade II) mitral regurgitation.  Moderate tricuspid regurgitation. Moderate pulmonary hypertension.  Estimated pulmonary artery systolic pressure is 50 mmHg. Estimated RA pressure 10-15 mmHg.  Compared to previous study on 03/19/2019, there is significant increase in PASP from 30 mmHg.    EKG  05/07/2021: Atrial fibrillation with controlled ventricular response and with PVCs at a rate of 89 bpm.  Normal axis.  Nonspecific T wave abnormality.  Cannot exclude anteroseptal infarct old.  EKG 11/05/2020: Sinus rhythm with frequent PVCs in bigeminal pattern at a rate of 94 bpm.   EKG 08/02/2020: Atrial fibrillation with controlled ventricular response at  rate of 75 beats minute, normal axis. Nonspecific T abnormality.  Single PVC.  Low-voltage complexes.  No significant change from EKG 11/04/2019   Assessment      ICD-10-CM   1. DOE (dyspnea on exertion)  R06.09 EKG 12-Lead    2. Acute on chronic diastolic heart failure (HCC)  I50.33 carvedilol (COREG) 25 MG tablet    dapagliflozin propanediol (FARXIGA) 10 MG TABS tablet    3. Non-ischemic cardiomyopathy ([place])  I42.8     4. Essential hypertension  I10     5. Permanent atrial fibrillation (HCC)  I48.21      CHA2DS2-VASc Score is 5.  Yearly risk of stroke: 7.2% (A, HTN, CAD & CHF).  Score of 1=0.6; 2=2.2; 3=3.2; 4=4.8; 5=7.2; 6=9.8; 7=>9.8) -(CHF; HTN; vasc disease DM,  Male = 1; Age <65 =0; 65-74 = 1,  >75 =2; stroke/embolism= 2).     Meds ordered this encounter  Medications  . carvedilol (COREG) 25 MG tablet    Sig: Take 1.5 tablets (37.5 mg total) by mouth 2 (two) times daily.    Dispense:  270 tablet    Refill:  3  . dapagliflozin propanediol (FARXIGA) 10 MG TABS tablet    Sig: Take 1 tablet (10 mg total) by mouth daily before breakfast.    Dispense:  30 tablet    Refill:  1   Medications Discontinued During This Encounter  Medication Reason  . colchicine 0.6 MG tablet   . amLODipine (NORVASC) 5 MG tablet Change in therapy  . carvedilol (COREG) 25 MG tablet Reorder   Recommendations:   Senan Urey  is a 76 y.o.  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   Patient is in Remote Patient Monitoring and Principal Care Management as patient is high risk for hospitalization and complications from underlying medical conditions.   Patient presents for 70-monthfollow-up.  At  last office visit patient was asymptomatic and risk factors were well controlled, therefore no changes were made.  Patient is presently in atrial fibrillation, remains well rate controlled.  He is tolerating anticoagulation without bleeding diathesis.  Blood pressure is well controlled.  Will not make changes to patient's medications at this time.  He continues to follow with our office anticoagulation clinic for management of warfarin.  RPM Patient: September 2023 Weight Data Average Weight Level 263.12 lbs Lowest Weight Level 261.2 lbs Highest Weight Level 264.8 lbs  OCEANIC-AF (Asundexian - factor XIa inhibitor PO BID vs Apixaban PO BID in patients with A. Fib for stroke prevention.   Will obtain most recent lipid profile testing from PCP.  Patient is otherwise stable from a cardiovascular standpoint.  Follow-up in 1 year, sooner if needed, for A. fib, hyperlipidemia, and CAD.   JAdrian Prows PA-C 04/12/2022, 12:59 PM Office: [phone]

## 2022-04-12 NOTE — Progress Notes (Unsigned)
Primary Physician/Referring:  Sonia Side., FNP  Patient ID: Gregory [name], male    DOB: 1946/05/20, 76 y.o.   MRN: [id number]  Chief Complaint  Patient presents with   Coronary Artery Disease   Shortness of Breath   Follow-up    1 week   Leg Swelling   HPI:    Gregory [name]  is a 76 y.o. 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   Patient presents for 77-monthfollow-up.  At last office visit patient was asymptomatic and risk factors were well controlled, therefore no changes were made.  Patient remains relatively asymptomatic.  Denies chest pain, dyspnea, dizziness, syncope, near syncope.  Denies orthopnea, PND.  He does have chronic bilateral lower leg edema for which she wears compression stockings, this remained stable.  He is tolerating anticoagulation without bleeding diathesis.  Patient is currently scheduled to see his PCP for regular follow-up later this week.  I have requested the patient have lab results from this upcoming visit sent to our office for review.  Past Medical History:  Diagnosis Date   CAD (coronary artery disease)    COPD (chronic obstructive pulmonary disease) ([facility]    Diabetes mellitus    Heart attack ([facility]    Hyperlipidemia    Hypertension    Neuromuscular disorder (HCC)    SOB (shortness of breath) on exertion    Past Surgical History:  Procedure Laterality Date   BACK SURGERY     CARDIAC CATHETERIZATION     CORONARY STENT PLACEMENT     KNEE ARTHROPLASTY     LEFT HEART CATH AND CORONARY ANGIOGRAPHY N/A 03/19/2019   Procedure: LEFT HEART CATH AND CORONARY ANGIOGRAPHY;  Surgeon: PNigel Mormon MD;  [facility];  Service: Cardiovascular;  Laterality: N/A;   LEFT HEART  CATHETERIZATION WITH CORONARY ANGIOGRAM N/A 03/03/2012   Procedure: LEFT HEART CATHETERIZATION WITH CORONARY ANGIOGRAM;  Surgeon: JLaverda Page MD;  [facility];  Service: Cardiovascular;  Laterality: N/A;   PERCUTANEOUS CORONARY STENT INTERVENTION (PCI-S)  03/03/2012   Procedure: PERCUTANEOUS CORONARY STENT INTERVENTION (PCI-S);  Surgeon: JLaverda Page MD;  [facility];  Service: Cardiovascular;;   Family History  Problem Relation Age of Onset   Hypertension Father    Hyperlipidemia Father    Stroke Father    Hypertension Paternal Grandfather    Hyperlipidemia Paternal Grandfather    Diabetes Other        maternal uncles   Diabetes Sister    Cancer Mother    Colon cancer Neg Hx    Social History   Tobacco Use   Smoking status: Former    Packs/day: 1.00    Years: 30.00    Total pack years: 30.00    Types: Cigarettes    Quit date: 07/30/2003    Years since quitting: 18.7   Smokeless tobacco: Never  Substance Use Topics   Alcohol use: Yes    Alcohol/week: 0.0 standard drinks of alcohol    Comment: wine rarely    Marital Status: Married  ROS  Review of Systems  Constitutional: Negative for malaise/fatigue and weight gain.  Cardiovascular:  Positive for dyspnea on exertion (chronic and stable) and palpitations (stable). Negative for chest pain, claudication, leg swelling, near-syncope, orthopnea, paroxysmal nocturnal dyspnea and  syncope.  Respiratory:  Negative for shortness of breath.   Musculoskeletal:  Positive for back pain (chronic and 3 back operations).  Neurological:  Positive for paresthesias (fingers).   Objective      04/12/2022   11:21 AM 09/21/2021   11:06 AM 05/07/2021    1:09 PM  Vitals with BMI  Height 5' 11"  5' 11"  5' 11"   Weight 270 lbs 263 lbs 11 oz 260 lbs 6 oz  BMI 37.67 42.7 06.23  Systolic 762 831 517  Diastolic 80 67 72  Pulse 68 48 92    Blood pressure 105/80, pulse 68, temperature 97.8 F (36.6 C), temperature  source Temporal, resp. rate 16, height 5' 11"  (1.803 m), weight 270 lb (122.5 kg), SpO2 97 %. Body mass index is 37.66 kg/m.   Physical Exam Vitals reviewed.  Constitutional:      Appearance: He is obese.     Comments: He is well-built and moderately obese in no acute distress.  Cardiovascular:     Rate and Rhythm: Normal rate. Rhythm irregular.     Pulses: Normal pulses and intact distal pulses.     Heart sounds: No murmur heard.    No gallop. No S3 or S4 sounds.     Comments: No JVD.   Pulmonary:     Effort: Pulmonary effort is normal.     Breath sounds: Normal breath sounds.  Abdominal:     Comments: Obese.  Musculoskeletal:     Right lower leg: Edema (trace) present.     Left lower leg: Edema (trace) present.  Neurological:     General: No focal deficit present.     Mental Status: He is alert and oriented to person, place, and time.   Physical exam unchanged compared to previous office visit.  Laboratory examination:   Recent Labs    09/21/21 1055 04/09/22 1454  NA 141 146*  K 4.1 3.5  CL 105 106  CO2 28 16*  GLUCOSE 97 88  BUN 18 29*  CREATININE 1.79* 1.77*  CALCIUM 9.3 9.5  GFRNONAA 39*  --        Latest Ref Rng & Units 04/09/2022    2:54 PM 09/21/2021   10:55 AM 06/01/2020   11:58 AM  CMP  Glucose 70 - 99 mg/dL 88  97  92   BUN 8 - 27 mg/dL 29  18  16    Creatinine 0.76 - 1.27 mg/dL 1.77  1.79  1.79   Sodium 134 - 144 mmol/L 146  141  140   Potassium 3.5 - 5.2 mmol/L 3.5  4.1  3.8   Chloride 96 - 106 mmol/L 106  105  104   CO2 20 - 29 mmol/L 16  28  26    Calcium 8.6 - 10.2 mg/dL 9.5  9.3  9.8   Total Protein 6.0 - 8.5 g/dL 6.7  7.4  8.2   Total Bilirubin 0.0 - 1.2 mg/dL 0.4  0.8  0.8   Alkaline Phos 44 - 121 IU/L 126  99  126   AST 0 - 40 IU/L 18  13  11    ALT 0 - 44 IU/L 17  13  9        Latest Ref Rng & Units 09/21/2021   10:55 AM 08/10/2020    8:31 AM 07/26/2020    2:25 PM  CBC  WBC 4.0 - 10.5 K/uL 6.5  5.6  6.5   Hemoglobin 13.0 - 17.0 g/dL  13.2  12.2  13.5  Hematocrit 39.0 - 52.0 % 41.3  38.3  42.1   Platelets 150 - 400 K/uL 179  196  235    Lipid Panel No results for input(s): "CHOL", "TRIG", "LDLCALC", "VLDL", "HDL", "CHOLHDL", "LDLDIRECT" in the last 8760 hours.  HEMOGLOBIN A1C Lab Results  Component Value Date   HGBA1C 6.0 (H) 03/18/2019   MPG 125.5 03/18/2019   BNP (last 3 results) No results for input(s): "BNP" in the last 8760 hours.  ProBNP (last 3 results) Recent Labs    04/09/22 1454  PROBNP 2,421*   External Labs:   Lab Results  Component Value Date   INR 1.6 (A) 10/29/2021   INR 2.6 10/26/2021   INR 3.8 (A) 10/23/2021   External labs: Hemoglobin 13.500 g/d 07/26/2020 Platelets 235.000 K/ 07/26/2020  Creatinine, Serum 1.790 mg/ 06/01/2020 Potassium 3.800 mm 06/01/2020 ALT (SGPT) 9.000 U/L 06/01/2020 T SH 4.350 08/23/2019  Lab 01/17/2020:  Serum glucose 109 mg, BUN 55, creatinine 2.6, EGFR 29.3 mL, sodium 140, potassium 4.8.  PSA 0.777 ng/ 08/23/2019  Allergies  No Known Allergies   Medications   Current Meds  Medication Sig   allopurinol (ZYLOPRIM) 300 MG tablet Take 300 mg by mouth daily.   amLODipine (NORVASC) 5 MG tablet TAKE 1 TABLET BY MOUTH  DAILY   apixaban (ELIQUIS) 5 MG TABS tablet Take 5 mg by mouth 2 (two) times daily.   atorvastatin (LIPITOR) 20 MG tablet TAKE 1 TABLET BY MOUTH ONCE DAILY AT  6PM   carvedilol (COREG) 25 MG tablet Take 25 mg by mouth 2 (two) times daily.   furosemide (LASIX) 20 MG tablet Take 1 tablet (20 mg total) by mouth every morning.   glipiZIDE (GLUCOTROL) 5 MG tablet Take 5 mg by mouth daily before breakfast.   hydrALAZINE (APRESOLINE) 100 MG tablet Take 100 mg by mouth 2 (two) times daily.    Investigational - Study Medication Take 1-2 tablets by mouth in the morning and at bedtime. Study name: OCEANIC-AF (Asundexian - factor XIa inhibitor PO QD vs Apixaban PO BID in patients with A. Fib for stroke prevention). Additional study details: Treat study drug  like Eliquis.   ONETOUCH VERIO test strip 1 each daily.   spironolactone (ALDACTONE) 25 MG tablet Take 1 tablet (25 mg total) by mouth every morning.   Tamsulosin HCl (FLOMAX) 0.4 MG CAPS Take 0.4 mg by mouth daily.   valsartan (DIOVAN) 80 MG tablet Take 1 tablet (80 mg total) by mouth daily.   Radiology:   No results found.  Cardiac Studies:   Coronary Angiography 03/19/2019:  No change from 03/06/2012: LM: Normal LAD: Mid LAD focal 40% stenosis          Ostial diag-2 80% stenosis with TIMI III flow. Unchanged compared to 2013 angiography. Patent mid Diag 2 stent without ISR LCx: Patent mid LCX overlapping stents with 10-20% late lumen loss RCA: CTO Mid 100% occlusion with grade 3 left-to-right collaterals.   Echocardiogram 09/22/2019:  Left ventricle cavity is normal in size. Mild concentric hypertrophy of the left ventricle. Normal global wall motion. Mildly depressed LV systolic function with visual EF 40-45%. Could not evaluate diastolic function, patient probably in Afib.  Left atrial cavity is severely dilated. LA vol index 67 ml/m2. Trileaflet aortic valve. Trace aortic stenosis. Trace aortic regurgitation. Mild aortic valve leaflet calcification.  Moderate (Grade II) mitral regurgitation.  Moderate tricuspid regurgitation. Moderate pulmonary hypertension.  Estimated pulmonary artery systolic pressure is 50 mmHg. Estimated RA pressure 10-15 mmHg.  Compared to  previous study on 03/19/2019, there is significant increase in PASP from 30 mmHg.    EKG  05/07/2021: Atrial fibrillation with controlled ventricular response and with PVCs at a rate of 89 bpm.  Normal axis.  Nonspecific T wave abnormality.  Cannot exclude anteroseptal infarct old.  EKG 11/05/2020: Sinus rhythm with frequent PVCs in bigeminal pattern at a rate of 94 bpm.   EKG 08/02/2020: Atrial fibrillation with controlled ventricular response at rate of 75 beats minute, normal axis. Nonspecific T abnormality.  Single PVC.   Low-voltage complexes.  No significant change from EKG 11/04/2019   Assessment      ICD-10-CM   1. DOE (dyspnea on exertion)  R06.09 EKG 12-Lead     CHA2DS2-VASc Score is 5.  Yearly risk of stroke: 7.2% (A, HTN, CAD & CHF).  Score of 1=0.6; 2=2.2; 3=3.2; 4=4.8; 5=7.2; 6=9.8; 7=>9.8) -(CHF; HTN; vasc disease DM,  Male = 1; Age <65 =0; 65-74 = 1,  >75 =2; stroke/embolism= 2).     No orders of the defined types were placed in this encounter.  Medications Discontinued During This Encounter  Medication Reason   colchicine 0.6 MG tablet    Recommendations:   Gregory [name]  is a 76 y.o.  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   Patient is in Remote Patient Monitoring and Principal Care Management as patient is high risk for hospitalization and complications from underlying medical conditions.   Patient presents for 13-monthfollow-up.  At last office visit patient was asymptomatic and risk factors were well controlled, therefore no changes were made.  Patient is presently in atrial fibrillation, remains well rate controlled.  He is tolerating anticoagulation without bleeding diathesis.  Blood pressure is well controlled.  Will not make changes to patient's medications at this time.  He continues to follow with our office anticoagulation clinic for management of warfarin.  RPM Patient: September 2023 Weight Data Average Weight Level 263.12 lbs Lowest Weight Level 261.2 lbs Highest Weight Level 264.8 lbs  OCEANIC-AF (Asundexian - factor XIa inhibitor PO BID vs Apixaban PO BID in patients with A. Fib for stroke prevention.   Will obtain most recent lipid profile testing from PCP.  Patient  is otherwise stable from a cardiovascular standpoint.  Follow-up in 1 year, sooner if needed, for A. fib, hyperlipidemia, and CAD.   JAdrian Prows MD, [facility]10/20/2023, 1:07 PM Office: [phone]: [phone]: [phone]

## 2022-04-14 ENCOUNTER — Telehealth: Payer: Self-pay | Admitting: Cardiology

## 2022-04-14 NOTE — Telephone Encounter (Signed)
SPEP with Reflex 09/21/2021: The SPE pattern appears unremarkable. Evidence of monoclonal protein is not apparent.  I am thinking that this could be amyloid cardiomyopathy otherwise strain or ATTR.  Just wanted your thoughts.

## 2022-04-15 NOTE — Telephone Encounter (Signed)
Recently has had congestive heart failure, also he recently had bilateral carpal tunnel injections.  I have ordered technetium pyrophosphate scan.  If positive, will send for genetic testing for ATTR.  Do think this is appropriate?

## 2022-04-22 ENCOUNTER — Other Ambulatory Visit: Payer: Medicare Other

## 2022-04-24 ENCOUNTER — Telehealth (HOSPITAL_COMMUNITY): Payer: Self-pay | Admitting: *Deleted

## 2022-04-24 NOTE — Telephone Encounter (Signed)
Reminder call for Monday at 12:30 for Amyloid study.

## 2022-04-29 ENCOUNTER — Ambulatory Visit (HOSPITAL_COMMUNITY): Payer: Medicare Other | Attending: Internal Medicine

## 2022-04-29 DIAGNOSIS — I428 Other cardiomyopathies: Secondary | ICD-10-CM | POA: Diagnosis present

## 2022-04-29 DIAGNOSIS — R0609 Other forms of dyspnea: Secondary | ICD-10-CM

## 2022-04-29 MED ORDER — TECHNETIUM TC 99M TETROFOSMIN IV KIT
21.3000 | PACK | Freq: Once | INTRAVENOUS | Status: AC | PRN
  Administered 2022-04-29: 21.3 via INTRAVENOUS

## 2022-05-02 ENCOUNTER — Ambulatory Visit: Payer: Medicare Other | Admitting: Cardiology

## 2022-05-03 NOTE — Progress Notes (Signed)
His PYP scan is low risk for Amyloid  aTTR or Wild strain amyloid. However, can still have this if bone marrow biopsy that was previously performed does not mention anything about amyloid.  Hence my suspicion for amyloid heart disease is low, thoughts?

## 2022-05-08 ENCOUNTER — Ambulatory Visit: Payer: Medicare Other | Admitting: Student

## 2022-05-09 ENCOUNTER — Ambulatory Visit: Payer: Medicare Other

## 2022-05-09 DIAGNOSIS — I5033 Acute on chronic diastolic (congestive) heart failure: Secondary | ICD-10-CM

## 2022-05-09 DIAGNOSIS — R0609 Other forms of dyspnea: Secondary | ICD-10-CM

## 2022-05-09 DIAGNOSIS — I4821 Permanent atrial fibrillation: Secondary | ICD-10-CM

## 2022-05-13 ENCOUNTER — Ambulatory Visit: Payer: Medicare Other | Admitting: Cardiology

## 2022-05-13 ENCOUNTER — Encounter: Payer: Self-pay | Admitting: Cardiology

## 2022-05-13 VITALS — BP 125/75 | HR 47 | Resp 16 | Ht 71.0 in | Wt 262.0 lb

## 2022-05-13 DIAGNOSIS — I5042 Chronic combined systolic (congestive) and diastolic (congestive) heart failure: Secondary | ICD-10-CM

## 2022-05-13 DIAGNOSIS — I10 Essential (primary) hypertension: Secondary | ICD-10-CM

## 2022-05-13 DIAGNOSIS — I129 Hypertensive chronic kidney disease with stage 1 through stage 4 chronic kidney disease, or unspecified chronic kidney disease: Secondary | ICD-10-CM

## 2022-05-13 DIAGNOSIS — I4821 Permanent atrial fibrillation: Secondary | ICD-10-CM

## 2022-05-13 DIAGNOSIS — I2510 Atherosclerotic heart disease of native coronary artery without angina pectoris: Secondary | ICD-10-CM

## 2022-05-13 MED ORDER — APIXABAN 5 MG PO TABS: 5.0000 mg | ORAL_TABLET | Freq: Two times a day (BID) | ORAL | 3 refills | Status: DC

## 2022-05-13 NOTE — Progress Notes (Signed)
Primary Physician/Referring:  Sonia Side., FNP  Patient ID: Gregory [name], male    DOB: Aug 04, 1945, 76 y.o.   MRN: [id number]  Chief Complaint  Patient presents with   Atrial Flutter   Congestive Heart Failure   Hypertension   Follow-up    3 week   HPI:    Gregory [name]  is a 76 y.o. 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   Over the past 1 month, he has noticed weight gain, worsening dyspnea.  Denies orthopnea, PND.  He does have chronic bilateral lower leg edema for which she wears compression stockings, this remained stable.  He is tolerating anticoagulation without bleeding diathesis.  Past Medical History:  Diagnosis Date   CAD (coronary artery disease)    COPD (chronic obstructive pulmonary disease) ([place])    Diabetes mellitus    Heart attack ([place])    Hyperlipidemia    Hypertension    Neuromuscular disorder (HCC)    SOB (shortness of breath) on exertion    Past Surgical History:  Procedure Laterality Date   BACK SURGERY     CARDIAC CATHETERIZATION     CORONARY STENT PLACEMENT     KNEE ARTHROPLASTY     LEFT HEART CATH AND CORONARY ANGIOGRAPHY N/A 03/19/2019   Procedure: LEFT HEART CATH AND CORONARY ANGIOGRAPHY;  Surgeon: Nigel Mormon, MD;  Location: [place] [facility];  Service: Cardiovascular;  Laterality: N/A;   LEFT HEART CATHETERIZATION WITH CORONARY ANGIOGRAM N/A 03/03/2012   Procedure: LEFT HEART CATHETERIZATION WITH CORONARY ANGIOGRAM;  Surgeon: Laverda Page, MD;  Location: [facility] CATH LAB;  Service: Cardiovascular;  Laterality: N/A;   PERCUTANEOUS CORONARY STENT INTERVENTION (PCI-S)  03/03/2012   Procedure: PERCUTANEOUS CORONARY STENT INTERVENTION (PCI-S);  Surgeon: Laverda Page, MD;  Location: [facility] CATH  LAB;  Service: Cardiovascular;;   Social History   Tobacco Use   Smoking status: Former    Packs/day: 1.00    Years: 30.00    Total pack years: 30.00    Types: Cigarettes    Quit date: 07/30/2003    Years since quitting: 18.8   Smokeless tobacco: Never  Substance Use Topics   Alcohol use: Yes    Alcohol/week: 0.0 standard drinks of alcohol    Comment: wine rarely    Marital Status: Married  ROS  Review of Systems  Cardiovascular:  Positive for dyspnea on exertion. Negative for chest pain and leg swelling.   Objective      05/13/2022   12:52 PM 04/12/2022   11:21 AM 09/21/2021   11:06 AM  Vitals with BMI  Height _0  _1  _2   Weight 262 lbs 270 lbs 263 lbs 11 oz  BMI 36.56 31.51 76.1  Systolic 607 371 062  Diastolic 75 80 67  Pulse 47 68 48    Blood pressure 125/75, pulse (!) 47, resp. rate 16, height _3  (1.803 m), weight 262 lb (118.8 kg), SpO2 97 %. Body mass index is 36.54 kg/m.   Physical Exam Vitals reviewed.  Constitutional:      Appearance: He is obese.     Comments: He is well-built and moderately obese in no acute distress.  Neck:     Vascular: JVD present. No carotid bruit.  Cardiovascular:     Rate and Rhythm:  Normal rate. Rhythm irregular.     Pulses: Normal pulses and intact distal pulses.     Heart sounds: No murmur heard.    No gallop. No S3 or S4 sounds.     Comments:   Pulmonary:     Effort: Pulmonary effort is normal.     Breath sounds: Normal breath sounds.  Abdominal:     General: Bowel sounds are normal.     Palpations: Abdomen is soft.     Comments: Obese.  Musculoskeletal:     Right lower leg: Edema (2+ pitting) present.     Left lower leg: Edema (2+ pitting) present.   Physical exam unchanged compared to previous office visit.  Laboratory examination:   Recent Labs    09/21/21 1055 04/09/22 1454  NA 141 146*  K 4.1 3.5  CL 105 106  CO2 28 16*  GLUCOSE 97 88  BUN 18 29*  CREATININE 1.79* 1.77*  CALCIUM  9.3 9.5  GFRNONAA 39*  --       Latest Ref Rng & Units 04/09/2022    2:54 PM 09/21/2021   10:55 AM 06/01/2020   11:58 AM  CMP  Glucose 70 - 99 mg/dL 88  97  92   BUN 8 - 27 mg/dL _0 Creatinine 0.76 - 1.27 mg/dL 1.77  1.79  1.79   Sodium 134 - 144 mmol/L 146  141  140   Potassium 3.5 - 5.2 mmol/L 3.5  4.1  3.8   Chloride 96 - 106 mmol/L 106  105  104   CO2 20 - 29 mmol/L _1 Calcium 8.6 - 10.2 mg/dL 9.5  9.3  9.8   Total Protein 6.0 - 8.5 g/dL 6.7  7.4  8.2   Total Bilirubin 0.0 - 1.2 mg/dL 0.4  0.8  0.8   Alkaline Phos 44 - 121 IU/L 126  99  126   AST 0 - 40 IU/L _2 ALT 0 - 44 IU/L _3 Latest Ref Rng & Units 09/21/2021   10:55 AM 08/10/2020    8:31 AM 07/26/2020    2:25 PM  CBC  WBC 4.0 - 10.5 K/uL 6.5  5.6  6.5   Hemoglobin 13.0 - 17.0 g/dL 13.2  12.2  13.5   Hematocrit 39.0 - 52.0 % 41.3  38.3  42.1   Platelets 150 - 400 K/uL 179  196  235    Lipid Panel No results for input(s): "CHOL", "TRIG", "LDLCALC", "VLDL", "HDL", "CHOLHDL", "LDLDIRECT" in the last 8760 hours.  HEMOGLOBIN A1C Lab Results  Component Value Date   HGBA1C 6.0 (H) 03/18/2019   MPG 125.5 03/18/2019   BNP (last 3 results) No results for input(s): "BNP" in the last 8760 hours.  ProBNP (last 3 results) Recent Labs    04/09/22 1454  PROBNP 2,421*   SPEP with Reflex 09/21/2021: The SPE pattern appears unremarkable. Evidence of monoclonal protein is not apparent.  External labs: Hemoglobin 13.500 g/d 07/26/2020 Platelets 235.000 K/ 07/26/2020  Creatinine, Serum 1.790 mg/ 06/01/2020 Potassium 3.800 mm 06/01/2020 ALT (SGPT) 9.000 U/L 06/01/2020 TSH 4.350 08/23/2019  Lab 01/17/2020:  Serum glucose 109 mg, BUN 55, creatinine 2.6, EGFR 29.3 mL, sodium 140, potassium 4.8.  PSA 0.777 ng/ 08/23/2019  Allergies  No Known Allergies   Medications   Current Outpatient Medications:    allopurinol (ZYLOPRIM) 300 MG tablet,  Take 300 mg by mouth daily., Disp: , Rfl:     atorvastatin (LIPITOR) 20 MG tablet, TAKE 1 TABLET BY MOUTH ONCE DAILY AT  6PM, Disp: 90 tablet, Rfl: 1   carvedilol (COREG) 25 MG tablet, Take 1.5 tablets (37.5 mg total) by mouth 2 (two) times daily., Disp: 270 tablet, Rfl: 3   dapagliflozin propanediol (FARXIGA) 10 MG TABS tablet, Take 1 tablet (10 mg total) by mouth daily before breakfast., Disp: 30 tablet, Rfl: 1   furosemide (LASIX) 20 MG tablet, Take 1 tablet (20 mg total) by mouth every morning., Disp: 90 tablet, Rfl: 2   glipiZIDE (GLUCOTROL) 5 MG tablet, Take 5 mg by mouth daily before breakfast., Disp: , Rfl:    hydrALAZINE (APRESOLINE) 100 MG tablet, Take 100 mg by mouth 2 (two) times daily. , Disp: , Rfl:    ONETOUCH VERIO test strip, 1 each daily., Disp: , Rfl:    spironolactone (ALDACTONE) 25 MG tablet, Take 1 tablet (25 mg total) by mouth every morning., Disp: 90 tablet, Rfl: 0   Tamsulosin HCl (FLOMAX) 0.4 MG CAPS, Take 0.4 mg by mouth daily., Disp: , Rfl:    valsartan (DIOVAN) 80 MG tablet, Take 1 tablet (80 mg total) by mouth daily., Disp: 30 tablet, Rfl: 1   apixaban (ELIQUIS) 5 MG TABS tablet, Take 1 tablet (5 mg total) by mouth 2 (two) times daily., Disp: 180 tablet, Rfl: 3   Radiology:   Myocardial amyloid imaging planar and SPECT 05/03/2022:   By semi-quantitative assessment scan is consistent with increased heart uptake but less than rib uptake-Grade 1. Heart to contralateral lung ratio is between 1-1.5, indeterminate for amyloid.   Study is equivocal for TTR amyloidosis (visual score of 1/ratio between 1-1.5).  Cardiac Studies:   Coronary Angiography 03/19/2019:  No change from 03/06/2012: LM: Normal LAD: Mid LAD focal 40% stenosis          Ostial diag-2 80% stenosis with TIMI III flow. Unchanged compared to 2013 angiography. Patent mid Diag 2 stent without ISR LCx: Patent mid LCX overlapping stents with 10-20% late lumen loss RCA: CTO Mid 100% occlusion with grade 3 left-to-right collaterals.     PCV STRAIN  IMAGING ECHOCARDIOGRAM 05/09/2022  Narrative Echocardiogram 05/09/2022: Moderately depressed LV systolic function with visual EF 35-40%. Left ventricle cavity is normal in size. Mild concentric hypertrophy of the left ventricle. Hypokinetic global wall motion. Indeterminate diastolic filling pattern. Calculated EF 34%. Left atrial cavity is mildly dilated at 37.1 ml/m^2. Trileaflet aortic valve.  Trace aortic regurgitation. Mild aortic valve leaflet thickening. Structurally normal mitral valve.  Mild (Grade I) mitral regurgitation. Structurally normal tricuspid valve.  Moderate tricuspid regurgitation. Mild pulmonary hypertension. RVSP measures 31 mmHg. Compared to 08/2019, EF is slightly worse (previously 40-45%) and pulmonary HTN is mild compared to moderate-severe.   EKG   EKG 05/21/2022: Atrial fibrillation with controlled ventricular response at the rate of 86 bpm, normal axis, incomplete right bundle branch block.  Nonspecific T abnormality.  PVCs (3).  Compared to 04/12/2022, no significant change.  Assessment      ICD-10-CM   1. Atherosclerosis of native coronary artery of native heart without angina pectoris  I25.10     2. Chronic combined systolic and diastolic heart failure (HCC)  I50.42     3. Permanent atrial fibrillation (HCC)  I48.21 EKG 12-Lead    apixaban (ELIQUIS) 5 MG TABS tablet    4. Essential hypertension  I10     5. CKD stage 4 secondary to hypertension (  [place])  I12.9    N18.4      CHA2DS2-VASc Score is 5.  Yearly risk of stroke: 7.2% (A, HTN, CAD & CHF).  Score of 1=0.6; 2=2.2; 3=3.2; 4=4.8; 5=7.2; 6=9.8; 7=>9.8) -(CHF; HTN; vasc disease DM,  Male = 1; Age <65 =0; 65-74 = 1,  >75 =2; stroke/embolism= 2).     Meds ordered this encounter  Medications   apixaban (ELIQUIS) 5 MG TABS tablet    Sig: Take 1 tablet (5 mg total) by mouth 2 (two) times daily.    Dispense:  180 tablet    Refill:  3   Medications Discontinued During This Encounter  Medication  Reason   Investigational - Study Medication Ineffective   apixaban (ELIQUIS) 5 MG TABS tablet Reorder    Recommendations:   Gregory [name]  is a 76 y.o.  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  Patient was seen 4 weeks ago for acute decompensated heart failure.  He now presents for follow-up and has noticed marked improvement in overall wellbeing, dyspnea and leg edema has resolved.  Patient is in Remote Patient Monitoring and Principal Care Management as patient is high risk for hospitalization and complications from underlying medical conditions.   1. Chronic combined systolic and diastolic heart failure ([place]) Patient's LVEF has slightly decreased.  I have discussed with the patient that since being on Farxiga, with symptoms of heart failure is completely resolved and he has no leg edema.  Continue the same for now.  Patient is presently on low-dose of valsartan although on spironolactone, could not tolerate Entresto due to renal failure and low blood pressure.  Continue the same for now.  I also did extensive review of his labs, as he has had elevated MGUS, I also excluded amyloid heart disease by performing pyrophosphate scan.  I also discussed this with hematology as well.  Patient has had bone marrow biopsy in the past, did not reveal any amyloid.  Hence amyloid heart disease is very unlikely.  2. Atherosclerosis of native coronary artery of native heart without angina pectoris No recurrence of angina pectoris, heart failure could be the manifestation of coronary artery disease.  We will continue to monitor this closely, would have a low threshold to repeat stress testing.  Last cardiac catheterization was in 2020 revealing patent circumflex stent and  diagonal stent.  3. Permanent atrial fibrillation ([place]) Patient is enrolled in [employer]-AF (Asundexian - factor XIa inhibitor PO BID vs Apixaban PO BID in patients with A. Fib for stroke prevention.  However we received a letter regarding termination of the study, patient restarted back on Eliquis 5 mg p.o. twice daily.  4. Essential hypertension Blood pressure is well controlled, blood pressure in fact is soft, unable to increase beta-blockers any further.  5. CKD stage 4 secondary to hypertension ([place]) Stable kidney function, continue present medications.   Patient enrolled in RPM for heart failure.  05/10/2022 Friday at 09:31 AM 260  05/08/2022 Wednesday at 09:51 AM 261.2  05/07/2022 Tuesday at 10:15 AM 259.8  05/06/2022 Monday at 10:48 AM 258.4  05/04/2022 Saturday at 10:30 AM 258.2  05/03/2022 Friday at 09:58 AM 259.6  05/01/2022 Wednesday at 10:25 AM 257.4  04/30/2022 Tuesday at 09:34 AM 260.8     Adrian Prows, MD, [facility] 05/13/2022, 1:42 PM Office: [phone] Fax:  [phone] Pager: [phone]

## 2022-05-14 ENCOUNTER — Inpatient Hospital Stay: Payer: Medicare Other | Admitting: Hematology and Oncology

## 2022-05-14 ENCOUNTER — Inpatient Hospital Stay: Payer: Medicare Other | Attending: Hematology and Oncology

## 2022-05-14 DIAGNOSIS — N183 Chronic kidney disease, stage 3 unspecified: Secondary | ICD-10-CM | POA: Diagnosis not present

## 2022-05-14 DIAGNOSIS — D472 Monoclonal gammopathy: Secondary | ICD-10-CM | POA: Insufficient documentation

## 2022-05-14 LAB — COMPREHENSIVE METABOLIC PANEL
ALT: 13 U/L (ref 0–44)
AST: 18 U/L (ref 15–41)
Albumin: 4.1 g/dL (ref 3.5–5.0)
Alkaline Phosphatase: 115 U/L (ref 38–126)
Anion gap: 7 (ref 5–15)
BUN: 17 mg/dL (ref 8–23)
CO2: 30 mmol/L (ref 22–32)
Calcium: 9.6 mg/dL (ref 8.9–10.3)
Chloride: 103 mmol/L (ref 98–111)
Creatinine, Ser: 1.64 mg/dL — ABNORMAL HIGH (ref 0.61–1.24)
GFR, Estimated: 43 mL/min — ABNORMAL LOW (ref >60)
Glucose, Bld: 103 mg/dL — ABNORMAL HIGH (ref 70–99)
Potassium: 4 mmol/L (ref 3.5–5.1)
Sodium: 140 mmol/L (ref 135–145)
Total Bilirubin: 0.9 mg/dL (ref 0.3–1.2)
Total Protein: 7.4 g/dL (ref 6.5–8.1)

## 2022-05-14 LAB — CBC WITH DIFFERENTIAL/PLATELET
Abs Immature Granulocytes: 0.01 10*3/uL (ref 0.00–0.07)
Basophils Absolute: 0.1 10*3/uL (ref 0.0–0.1)
Basophils Relative: 1 %
Eosinophils Absolute: 0.3 10*3/uL (ref 0.0–0.5)
Eosinophils Relative: 4 %
HCT: 48.4 % (ref 39.0–52.0)
Hemoglobin: 16 g/dL (ref 13.0–17.0)
Immature Granulocytes: 0 %
Lymphocytes Relative: 26 %
Lymphs Abs: 1.9 10*3/uL (ref 0.7–4.0)
MCH: 30.6 pg (ref 26.0–34.0)
MCHC: 33.1 g/dL (ref 30.0–36.0)
MCV: 92.5 fL (ref 80.0–100.0)
Monocytes Absolute: 0.7 10*3/uL (ref 0.1–1.0)
Monocytes Relative: 10 %
Neutro Abs: 4.4 10*3/uL (ref 1.7–7.7)
Neutrophils Relative %: 59 %
Platelets: 219 10*3/uL (ref 150–400)
RBC: 5.23 MIL/uL (ref 4.22–5.81)
RDW: 13.9 % (ref 11.5–15.5)
WBC: 7.3 10*3/uL (ref 4.0–10.5)
nRBC: 0 % (ref 0.0–0.2)

## 2022-05-15 LAB — KAPPA/LAMBDA LIGHT CHAINS
Kappa free light chain: 80.7 mg/L — ABNORMAL HIGH (ref 3.3–19.4)
Kappa, lambda light chain ratio: 3.43 — ABNORMAL HIGH (ref 0.26–1.65)
Lambda free light chains: 23.5 mg/L (ref 5.7–26.3)

## 2022-05-17 ENCOUNTER — Other Ambulatory Visit: Payer: Self-pay | Admitting: Cardiology

## 2022-05-17 DIAGNOSIS — I10 Essential (primary) hypertension: Secondary | ICD-10-CM

## 2022-05-22 ENCOUNTER — Inpatient Hospital Stay (HOSPITAL_BASED_OUTPATIENT_CLINIC_OR_DEPARTMENT_OTHER): Payer: Medicare Other | Admitting: Hematology and Oncology

## 2022-05-22 DIAGNOSIS — D472 Monoclonal gammopathy: Secondary | ICD-10-CM

## 2022-05-22 NOTE — Progress Notes (Signed)
[facility] NOTE  Patient Care Team: Sonia Side., FNP as PCP - General (Family Medicine)  CHIEF COMPLAINTS/PURPOSE OF CONSULTATION:  Abnormal labs.  ASSESSMENT & PLAN:   Light chain MGUS  This is a very pleasant 76 year old male patient with past medical history significant for diabetes, hypertension, atrial fibrillation, chronic kidney disease here for FU of light chain MGUS. BMB with no evidence of MM. Bone survey March 2022 negative for malignancy. He is here for telephone visit.  Since last yr, no concerning ROS We reviewed labs which shows no concerns at this time. CBC unremarkable, K/L ratio stable, We discussed RTC in one yr.  2. CKD stage III Unrelated to the MGUS. He should continue FU with his nephrologist.  No orders of the defined types were placed in this encounter.    HISTORY OF PRESENTING ILLNESS:  Gregory [name] 76 y.o. male is here because of some abnormal labs, specifically increased free light chains.  This is a very pleasant 77 year old male patient with past medical history significant for type 2 diabetes mellitus, hypertension, atrial fibrillation on anticoagulation who presented to hematology for worsening chronic kidney disease and elevated kappa light chain ratio.    Interim History  Since last visit, he is doing remarkably well. He is here for a telephone visit. NO B symptoms No new bone pains. No change in breathing/bowel habits or urinary habits No new neurological complaints.  SOCIAL HISTORY  He retired from [employer] company.   He quit smoking in 2005.   Rest of the pertinent 10 point ROS as mentioned below and negative  REVIEW OF SYSTEMS:   Constitutional: Denies fevers, chills or abnormal night sweats Eyes: Denies blurriness of vision, double vision or watery eyes Ears, nose, mouth, throat, and face: Denies mucositis or sore throat Respiratory: Denies cough, dyspnea or wheezes Cardiovascular: Denies  palpitation, chest discomfort or lower extremity swelling Gastrointestinal:  Denies nausea, heartburn or change in bowel habits Skin: Denies abnormal skin rashes Lymphatics: Denies new lymphadenopathy or easy bruising Neurological:Denies numbness, tingling or new weaknesses Behavioral/Psych: Mood is stable, no new changes  All other systems were reviewed with the patient and are negative.  MEDICAL HISTORY:  Past Medical History:  Diagnosis Date   CAD (coronary artery disease)    COPD (chronic obstructive pulmonary disease) ([place])    Diabetes mellitus    Heart attack ([place])    Hyperlipidemia    Hypertension    Neuromuscular disorder (HCC)    SOB (shortness of breath) on exertion     SURGICAL HISTORY: Past Surgical History:  Procedure Laterality Date   BACK SURGERY     CARDIAC CATHETERIZATION     CORONARY STENT PLACEMENT     KNEE ARTHROPLASTY     LEFT HEART CATH AND CORONARY ANGIOGRAPHY N/A 03/19/2019   Procedure: LEFT HEART CATH AND CORONARY ANGIOGRAPHY;  Surgeon: Nigel Mormon, MD;  Location: [place] CV LAB;  Service: Cardiovascular;  Laterality: N/A;   LEFT HEART CATHETERIZATION WITH CORONARY ANGIOGRAM N/A 03/03/2012   Procedure: LEFT HEART CATHETERIZATION WITH CORONARY ANGIOGRAM;  Surgeon: Laverda Page, MD;  Location: [facility] CATH LAB;  Service: Cardiovascular;  Laterality: N/A;   PERCUTANEOUS CORONARY STENT INTERVENTION (PCI-S)  03/03/2012   Procedure: PERCUTANEOUS CORONARY STENT INTERVENTION (PCI-S);  Surgeon: Laverda Page, MD;  Location: [facility] CATH LAB;  Service: Cardiovascular;;    SOCIAL HISTORY: Social History   Socioeconomic History   Marital status: Married    Spouse name: Not on  file   Number of children: 3   Years of education: Not on file   Highest education level: Not on file  Occupational History   Occupation: retired  Tobacco Use   Smoking status: Former    Packs/day: 1.00    Years: 30.00    Total pack years: 30.00    Types: Cigarettes     Quit date: 07/30/2003    Years since quitting: 18.8   Smokeless tobacco: Never  Vaping Use   Vaping Use: Never used  Substance and Sexual Activity   Alcohol use: Yes    Alcohol/week: 0.0 standard drinks of alcohol    Comment: wine rarely   Drug use: No   Sexual activity: Not on file  Other Topics Concern   Not on file  Social History Narrative   Not on file   Social Determinants of Health   Financial Resource Strain: Not on file  Food Insecurity: Not on file  Transportation Needs: Not on file  Physical Activity: Not on file  Stress: Not on file  Social Connections: Not on file  Intimate Partner Violence: Not on file    FAMILY HISTORY: Family History  Problem Relation Age of Onset   Hypertension Father    Hyperlipidemia Father    Stroke Father    Hypertension Paternal Grandfather    Hyperlipidemia Paternal Grandfather    Diabetes Other        maternal uncles   Diabetes Sister    Cancer Mother    Colon cancer Neg Hx     ALLERGIES:  has No Known Allergies.  MEDICATIONS:  Current Outpatient Medications  Medication Sig Dispense Refill   allopurinol (ZYLOPRIM) 300 MG tablet Take 300 mg by mouth daily.     apixaban (ELIQUIS) 5 MG TABS tablet Take 1 tablet (5 mg total) by mouth 2 (two) times daily. 180 tablet 3   atorvastatin (LIPITOR) 20 MG tablet TAKE 1 TABLET BY MOUTH ONCE DAILY AT  6PM 90 tablet 1   carvedilol (COREG) 25 MG tablet Take 1.5 tablets (37.5 mg total) by mouth 2 (two) times daily. 270 tablet 3   dapagliflozin propanediol (FARXIGA) 10 MG TABS tablet Take 1 tablet (10 mg total) by mouth daily before breakfast. 30 tablet 1   furosemide (LASIX) 20 MG tablet Take 1 tablet (20 mg total) by mouth every morning. 90 tablet 2   glipiZIDE (GLUCOTROL) 5 MG tablet Take 5 mg by mouth daily before breakfast.     hydrALAZINE (APRESOLINE) 100 MG tablet Take 100 mg by mouth 2 (two) times daily.      ONETOUCH VERIO test strip 1 each daily.     spironolactone (ALDACTONE) 25  MG tablet TAKE 1 TABLET BY MOUTH ONCE DAILY IN THE MORNING 90 tablet 0   Tamsulosin HCl (FLOMAX) 0.4 MG CAPS Take 0.4 mg by mouth daily.     valsartan (DIOVAN) 80 MG tablet Take 1 tablet (80 mg total) by mouth daily. 30 tablet 1   No current facility-administered medications for this visit.     PHYSICAL EXAMINATION: ECOG PERFORMANCE STATUS: 0 - Asymptomatic  There were no vitals filed for this visit.   There were no vitals filed for this visit.  PE deferred , telephone visit   LABORATORY DATA:  I have reviewed the data as listed Lab Results  Component Value Date   WBC 7.3 05/14/2022   HGB 16.0 05/14/2022   HCT 48.4 05/14/2022   MCV 92.5 05/14/2022   PLT 219 05/14/2022  Chemistry      Component Value Date/Time   NA 140 05/14/2022 1447   NA 146 (H) 04/09/2022 1454   K 4.0 05/14/2022 1447   CL 103 05/14/2022 1447   CO2 30 05/14/2022 1447   BUN 17 05/14/2022 1447   BUN 29 (H) 04/09/2022 1454   CREATININE 1.64 (H) 05/14/2022 1447   CREATININE 1.79 (H) 06/01/2020 1158      Component Value Date/Time   CALCIUM 9.6 05/14/2022 1447   ALKPHOS 115 05/14/2022 1447   AST 18 05/14/2022 1447   AST 11 (L) 06/01/2020 1158   ALT 13 05/14/2022 1447   ALT 9 06/01/2020 1158   BILITOT 0.9 05/14/2022 1447   BILITOT 0.4 04/09/2022 1454   BILITOT 0.8 06/01/2020 1158      CMP with normal calcium, normal total protein and normal alk phos.    Creatinine of 1.68 Hb of 12 No M spike on SPEP Urine protein minimal. Monoclonal protein on urine not examined. K/L ratio of 3.52  We reviewed labs which show no evidence of end organ damage,, K/L ratio stable.  Surgical Pathology   Reason for Addendum #1:  Molecular Genetic Test Results, FISH   Clinical History: Monoclonal gammopathy      DIAGNOSIS:   BONE MARROW, ASPIRATE, CLOT, CORE:  -  Normocellular bone marrow with trilineage hematopoiesis and mild  plasmacytosis  -  See comment   PERIPHERAL BLOOD:  -  Normocytic  anemia     COMMENT:   Plasma cells are mildly increased on aspirate smears (4% by manual  differential count), with scattered plasma cells showing atypical  morphology.  CD138 immunohistochemistry on the clot and core biopsy  highlight plasma cells comprising 3-5% and 2% of overall marrow  cellularity, respectively. Light chain in situ hybridization reveals a  mixture of kappa and lambda expressing plasma cells, with focal plasma  cell groups showing kappa predominance. Together, low-level involvement  by a plasma cell neoplasm cannot be excluded.  Correlation with clinical  findings, radiographic studies, other laboratory data, and  cytogenetic/FISH results is recommended.   FISH no abnormalities.  Bone survey March 2022 neg.  RADIOGRAPHIC STUDIES: I have personally reviewed the radiological images as listed and agreed with the findings in the report. PCV STRAIN IMAGING ECHOCARDIOGRAM  Result Date: 05/12/2022 Echocardiogram 05/09/2022: Moderately depressed LV systolic function with visual EF 35-40%. Left ventricle cavity is normal in size. Mild concentric hypertrophy of the left ventricle. Hypokinetic global wall motion. Indeterminate diastolic filling pattern. Calculated EF 34%. Left atrial cavity is mildly dilated at 37.1 ml/m^2. Trileaflet aortic valve.  Trace aortic regurgitation. Mild aortic valve leaflet thickening. Structurally normal mitral valve.  Mild (Grade I) mitral regurgitation. Structurally normal tricuspid valve.  Moderate tricuspid regurgitation. Mild pulmonary hypertension. RVSP measures 31 mmHg. Compared to 08/2019, EF is slightly worse (previously 40-45%) and pulmonary HTN is mild compared to moderate-severe.   MYOCARDIAL AMYLOID IMAGING PLANAR AND SPECT  Result Date: 04/29/2022   By semi-quantitative assessment scan is consistent with increased heart uptake but less than rib uptake-Grade 1. Heart to contralateral lung ratio is between 1-1.5, indeterminate for  amyloid.   Study is equivocal for TTR amyloidosis (visual score of 1/ratio between 1-1.5).     All questions were answered. The patient knows to call the clinic with any problems, questions or concerns. I spent 10 minutes in the care of this patient including H and P, review of records, counseling, documentation and coordination of care.   I connected with  Lem Peary on 05/24/22 by a telephone application and verified that I am speaking with the correct person using two identifiers.   I discussed the limitations of evaluation and management by telemedicine. The patient expressed understanding and agreed to proceed.    Benay Pike, MD 05/22/2022 3:00 PM

## 2022-05-27 ENCOUNTER — Other Ambulatory Visit (HOSPITAL_COMMUNITY): Payer: Self-pay

## 2022-06-03 ENCOUNTER — Telehealth: Payer: Self-pay | Admitting: Cardiology

## 2022-06-03 DIAGNOSIS — I4821 Permanent atrial fibrillation: Secondary | ICD-10-CM

## 2022-06-03 DIAGNOSIS — Z5181 Encounter for therapeutic drug level monitoring: Secondary | ICD-10-CM

## 2022-06-03 MED ORDER — WARFARIN SODIUM 2.5 MG PO TABS: 2.5000 mg | ORAL_TABLET | ORAL | 3 refills | Status: DC

## 2022-06-03 MED ORDER — WARFARIN SODIUM 5 MG PO TABS: 5.0000 mg | ORAL_TABLET | ORAL | 0 refills | Status: DC

## 2022-06-03 NOTE — Telephone Encounter (Signed)
Pt part of [employer] AF trial (Factor 11a vs Eliquis) which has been closed early due to safety concerns. He remains on study medication per protocol (he never took Eliquis samples provided by Dr Einar Gip).  Previous Warfarin Dose: 2.5 mg (5 mg x 0.5) every Wed, Fri; 5 mg (5 mg x 1) all other days (managed by Dr Einar Gip).   06/03/22 INR = 1.4.     He does confirm he has a supply at home of warfarin currently.  Denies s/sx bleeding.  Due to his $435 deductible with part D medicare, eliquis is too expensive for him.                         Continue Study Medication twice daily until INR > 2  Restart Warfarin Tonight (06/03/22) Warfarin dose = 2.5 mg (1/2 tablet) every Wed, Fri; 5 mg (1 tablet) all other days  Monitor closely for any signs or symptoms of bleeding Follow-up INR at Medication Management on 06/07/22 at 12:30 pm  Dr Einar Gip could you send Warfarin 5 mg prescription to [facility]  on [address].

## 2022-06-03 NOTE — Telephone Encounter (Signed)
Chief Complaint  Patient presents with   Medication Management     ICD-10-CM   1. Permanent atrial fibrillation (HCC)  I48.21 warfarin (COUMADIN) 5 MG tablet    warfarin (COUMADIN) 2.5 MG tablet    2. Monitoring for long-term anticoagulant use  Z51.81 warfarin (COUMADIN) 5 MG tablet   Z79.01 warfarin (COUMADIN) 2.5 MG tablet     Meds ordered this encounter  Medications   warfarin (COUMADIN) 5 MG tablet    Sig: Take 1 tablet (5 mg total) by mouth as directed. 5 mg (1 tablet) daily except Wednesday and Friday 2.5 mg.    Dispense:  90 tablet    Refill:  0   warfarin (COUMADIN) 2.5 MG tablet    Sig: Take 1 tablet (2.5 mg total) by mouth as directed. 2.5 mg  every Wed, Fri as directed    Dispense:  90 tablet    Refill:  3   Medications Discontinued During This Encounter  Medication Reason   apixaban (ELIQUIS) 5 MG TABS tablet Cost of medication   warfarin (COUMADIN) 5 MG tablet Reorder    Stopped OCEANIC-AF (Asundexian - factor XIa inhibitor PO BID vs Apixaban PO BID study drug.   Adrian Prows, MD, [facility] 06/03/2022, 5:50 PM Office: [phone] Fax: [phone] Pager: [phone]

## 2022-06-11 ENCOUNTER — Telehealth: Payer: Self-pay | Admitting: Cardiology

## 2022-06-11 NOTE — Telephone Encounter (Signed)
Pt part of Oceanic AF trial (Factor 11a vs Eliquis) which has been closed early due to safety concerns.  Please see previous telephone message  Pt was seen at Medication Management on 06/07/22 INR was 1.9   -Study Medication stopped with last dose in PM of 06/07/22 to bridge until INR >2 -Continued Warfarin 2.5 mg (1/2 tablet) every Wed, Fri and 5 mg (1 tablet) all other days   Pt was seen yesterday 06/10/22 for Oceanic-AF Trial End of Treatment Visit.   06/10/22 INR = 2.1.    Dr Einar Gip - Patient has an office visit with you on 06/25/2022.  Could you check an INR during this visit and resume standard INR monitoring at [facility]?

## 2022-06-11 NOTE — Telephone Encounter (Signed)
Yes we will. Felicia please add a line to check INR

## 2022-06-25 ENCOUNTER — Telehealth: Payer: Self-pay

## 2022-06-25 ENCOUNTER — Encounter: Payer: Self-pay | Admitting: Cardiology

## 2022-06-25 ENCOUNTER — Ambulatory Visit: Payer: Medicare Other | Admitting: Cardiology

## 2022-06-25 ENCOUNTER — Other Ambulatory Visit: Payer: Self-pay

## 2022-06-25 VITALS — BP 126/70 | HR 59 | Resp 16 | Ht 71.0 in | Wt 261.0 lb

## 2022-06-25 DIAGNOSIS — I5042 Chronic combined systolic (congestive) and diastolic (congestive) heart failure: Secondary | ICD-10-CM

## 2022-06-25 DIAGNOSIS — I10 Essential (primary) hypertension: Secondary | ICD-10-CM

## 2022-06-25 DIAGNOSIS — I4821 Permanent atrial fibrillation: Secondary | ICD-10-CM

## 2022-06-25 LAB — POCT INR: INR: 4.6 — AB (ref 2.0–3.0)

## 2022-06-25 MED ORDER — VALSARTAN 80 MG PO TABS: 80.0000 mg | ORAL_TABLET | Freq: Every evening | ORAL | 3 refills | Status: AC

## 2022-06-25 NOTE — Progress Notes (Signed)
Primary Physician/Referring:  Sonia Side., FNP  Patient ID: Gregory [name], male    DOB: 10/25/1945, 77 y.o.   MRN: [id number]  Chief Complaint  Patient presents with   Hypertension   Congestive Heart Failure   Follow-up    6 week   HPI:    Gregory [name]  is a 77 y.o. 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  Patient was seen 6 weeks ago for acute decompensated heart failure.    He now presents for follow-up and has noticed marked improvement in overall wellbeing, dyspnea and leg edema has resolved. He does have chronic bilateral lower leg edema for which he wears compression stockings, this remained stable.  He is tolerating anticoagulation without bleeding diathesis.  Past Medical History:  Diagnosis Date   CAD (coronary artery disease)    COPD (chronic obstructive pulmonary disease) ([place])    Diabetes mellitus    Heart attack ([place])    Hyperlipidemia    Hypertension    Neuromuscular disorder (HCC)    SOB (shortness of breath) on exertion    Past Surgical History:  Procedure Laterality Date   BACK SURGERY     CARDIAC CATHETERIZATION     CORONARY STENT PLACEMENT     KNEE ARTHROPLASTY     LEFT HEART CATH AND CORONARY ANGIOGRAPHY N/A 03/19/2019   Procedure: LEFT HEART CATH AND CORONARY ANGIOGRAPHY;  Surgeon: Nigel Mormon, MD;  Location: [facility];  Service: Cardiovascular;  Laterality: N/A;   LEFT HEART CATHETERIZATION WITH CORONARY ANGIOGRAM N/A 03/03/2012   Procedure: LEFT HEART CATHETERIZATION WITH CORONARY ANGIOGRAM;  Surgeon: Laverda Page, MD;  Location: [facility] CATH LAB;  Service: Cardiovascular;  Laterality: N/A;   PERCUTANEOUS CORONARY STENT INTERVENTION (PCI-S)  03/03/2012   Procedure: PERCUTANEOUS CORONARY STENT  INTERVENTION (PCI-S);  Surgeon: Laverda Page, MD;  Location: [facility] CATH LAB;  Service: Cardiovascular;;   Social History   Tobacco Use   Smoking status: Former    Packs/day: 1.00    Years: 30.00    Total pack years: 30.00    Types: Cigarettes    Quit date: 07/30/2003    Years since quitting: 18.9   Smokeless tobacco: Never  Substance Use Topics   Alcohol use: Yes    Alcohol/week: 0.0 standard drinks of alcohol    Comment: wine rarely    Marital Status: Married  ROS  Review of Systems  Cardiovascular:  Negative for chest pain and dyspnea on exertion. Leg swelling: stable.  Objective      06/25/2022    1:27 PM 06/25/2022    1:26 PM 05/13/2022   12:52 PM  Vitals with BMI  Height  _0  _1   Weight  261 lbs 78 lbs  BMI  09.40 78.80  Systolic 881 103 159  Diastolic 70 80 75  Pulse 59 65 47    Blood pressure 126/70, pulse (!) 59, resp. rate 16, height _2  (1.803 m), weight 261 lb (118.4 kg), SpO2 97 %. Body mass index is 36.4 kg/m.   Physical Exam Vitals reviewed.  Constitutional:      Appearance: He is obese.     Comments: He is well-built and moderately obese in no acute distress.  Neck:     Vascular: No carotid bruit or JVD.  Cardiovascular:  Rate and Rhythm: Normal rate. Rhythm irregular.     Pulses: Normal pulses and intact distal pulses.     Heart sounds: No murmur heard.    No gallop. No S3 or S4 sounds.     Comments:   Pulmonary:     Effort: Pulmonary effort is normal.     Breath sounds: Normal breath sounds.  Abdominal:     General: Bowel sounds are normal.     Palpations: Abdomen is soft.     Comments: Obese.  Musculoskeletal:     Right lower leg: Edema (1-2+ pitting) present.     Left lower leg: Edema (1-2+ pitting) present.   Physical exam unchanged compared to previous office visit.  Laboratory examination:   Recent Labs    09/21/21 1055 04/09/22 1454 05/14/22 1447  NA 141 146* 140  K 4.1 3.5 4.0  CL 105 106 103  CO2 28 16*  30  GLUCOSE 97 88 103*  BUN 18 29* 17  CREATININE 1.79* 1.77* 1.64*  CALCIUM 9.3 9.5 9.6  GFRNONAA 39*  --  43*      Latest Ref Rng & Units 05/14/2022    2:47 PM 04/09/2022    2:54 PM 09/21/2021   10:55 AM  CMP  Glucose 70 - 99 mg/dL 103  88  97   BUN 8 - 23 mg/dL _0 Creatinine 0.61 - 1.24 mg/dL 1.64  1.77  1.79   Sodium 135 - 145 mmol/L 140  146  141   Potassium 3.5 - 5.1 mmol/L 4.0  3.5  4.1   Chloride 98 - 111 mmol/L 103  106  105   CO2 22 - 32 mmol/L _1 Calcium 8.9 - 10.3 mg/dL 9.6  9.5  9.3   Total Protein 6.5 - 8.1 g/dL 7.4  6.7  7.4   Total Bilirubin 0.3 - 1.2 mg/dL 0.9  0.4  0.8   Alkaline Phos 38 - 126 U/L 115  126  99   AST 15 - 41 U/L _2 ALT 0 - 44 U/L _3 Latest Ref Rng & Units 05/14/2022    2:47 PM 09/21/2021   10:55 AM 08/10/2020    8:31 AM  CBC  WBC 4.0 - 10.5 K/uL 7.3  6.5  5.6   Hemoglobin 13.0 - 17.0 g/dL 16.0  13.2  12.2   Hematocrit 39.0 - 52.0 % 48.4  41.3  38.3   Platelets 150 - 400 K/uL 219  179  196    Lipid Panel No results for input(s): "CHOL", "TRIG", "LDLCALC", "VLDL", "HDL", "CHOLHDL", "LDLDIRECT" in the last 8760 hours.  HEMOGLOBIN A1C Lab Results  Component Value Date   HGBA1C 6.0 (H) 03/18/2019   MPG 125.5 03/18/2019   BNP (last 3 results) No results for input(s): "BNP" in the last 8760 hours.  ProBNP (last 3 results) Recent Labs    04/09/22 1454  PROBNP 2,421*   SPEP with Reflex 09/21/2021: The SPE pattern appears unremarkable. Evidence of monoclonal protein is not apparent.  External labs: Hemoglobin 13.500 g/d 07/26/2020 Platelets 235.000 K/ 07/26/2020  Creatinine, Serum 1.790 mg/ 06/01/2020 Potassium 3.800 mm 06/01/2020 ALT (SGPT) 9.000 U/L 06/01/2020 TSH 4.350 08/23/2019  Lab 01/17/2020:  Serum glucose 109 mg, BUN 55, creatinine 2.6, EGFR 29.3 mL, sodium 140, potassium 4.8.  PSA 0.777 ng/ 08/23/2019  Allergies  No Known Allergies  Medications   Current Outpatient  Medications:    allopurinol (ZYLOPRIM) 300 MG tablet, Take 300 mg by mouth daily., Disp: , Rfl:    atorvastatin (LIPITOR) 20 MG tablet, TAKE 1 TABLET BY MOUTH ONCE DAILY AT  6PM, Disp: 90 tablet, Rfl: 1   carvedilol (COREG) 25 MG tablet, Take 1.5 tablets (37.5 mg total) by mouth 2 (two) times daily., Disp: 270 tablet, Rfl: 3   dapagliflozin propanediol (FARXIGA) 10 MG TABS tablet, Take 1 tablet (10 mg total) by mouth daily before breakfast., Disp: 30 tablet, Rfl: 1   furosemide (LASIX) 20 MG tablet, Take 1 tablet (20 mg total) by mouth every morning., Disp: 90 tablet, Rfl: 2   glipiZIDE (GLUCOTROL) 5 MG tablet, Take 5 mg by mouth daily before breakfast., Disp: , Rfl:    hydrALAZINE (APRESOLINE) 100 MG tablet, Take 100 mg by mouth 2 (two) times daily. , Disp: , Rfl:    ONETOUCH VERIO test strip, 1 each daily., Disp: , Rfl:    spironolactone (ALDACTONE) 25 MG tablet, TAKE 1 TABLET BY MOUTH ONCE DAILY IN THE MORNING, Disp: 90 tablet, Rfl: 0   Tamsulosin HCl (FLOMAX) 0.4 MG CAPS, Take 0.4 mg by mouth daily., Disp: , Rfl:    warfarin (COUMADIN) 2.5 MG tablet, Take 1 tablet (2.5 mg total) by mouth as directed. 2.5 mg  every Wed, Fri as directed, Disp: 90 tablet, Rfl: 3   warfarin (COUMADIN) 5 MG tablet, Take 1 tablet (5 mg total) by mouth as directed. 5 mg (1 tablet) daily except Wednesday and Friday 2.5 mg., Disp: 90 tablet, Rfl: 0   valsartan (DIOVAN) 80 MG tablet, Take 1 tablet (80 mg total) by mouth every evening., Disp: 90 tablet, Rfl: 3   Radiology:   Myocardial amyloid imaging planar and SPECT 05/03/2022:   By semi-quantitative assessment scan is consistent with increased heart uptake but less than rib uptake-Grade 1. Heart to contralateral lung ratio is between 1-1.5, indeterminate for amyloid.   Study is equivocal for TTR amyloidosis (visual score of 1/ratio between 1-1.5).  Cardiac Studies:   Coronary Angiography 03/19/2019:  No change from 03/06/2012: LM: Normal LAD: Mid LAD focal 40%  stenosis          Ostial diag-2 80% stenosis with TIMI III flow. Unchanged compared to 2013 angiography. Patent mid Diag 2 stent without ISR LCx: Patent mid LCX overlapping stents with 10-20% late lumen loss RCA: CTO Mid 100% occlusion with grade 3 left-to-right collaterals.     PCV STRAIN IMAGING ECHOCARDIOGRAM 05/09/2022  Narrative Echocardiogram 05/09/2022: Moderately depressed LV systolic function with visual EF 35-40%. Left ventricle cavity is normal in size. Mild concentric hypertrophy of the left ventricle. Hypokinetic global wall motion. Indeterminate diastolic filling pattern. Calculated EF 34%. Left atrial cavity is mildly dilated at 37.1 ml/m^2. Trileaflet aortic valve.  Trace aortic regurgitation. Mild aortic valve leaflet thickening. Structurally normal mitral valve.  Mild (Grade I) mitral regurgitation. Structurally normal tricuspid valve.  Moderate tricuspid regurgitation. Mild pulmonary hypertension. RVSP measures 31 mmHg. Compared to 08/2019, EF is slightly worse (previously 40-45%) and pulmonary HTN is mild compared to moderate-severe.   EKG   EKG 05/21/2022: Atrial fibrillation with controlled ventricular response at the rate of 86 bpm, normal axis, incomplete right bundle branch block.  Nonspecific T abnormality.  PVCs (3).  Compared to 04/12/2022, no significant change.  Assessment      ICD-10-CM   1. Chronic combined systolic and diastolic heart failure (HCC)  I50.42     2. Essential  hypertension  I10     3. Permanent atrial fibrillation (HCC)  I48.21 POCT INR     CHA2DS2-VASc Score is 5.  Yearly risk of stroke: 7.2% (A, HTN, CAD & CHF).  Score of 1=0.6; 2=2.2; 3=3.2; 4=4.8; 5=7.2; 6=9.8; 7=>9.8) -(CHF; HTN; vasc disease DM,  Male = 1; Age <65 =0; 65-74 = 1,  >75 =2; stroke/embolism= 2).     Meds ordered this encounter  Medications   valsartan (DIOVAN) 80 MG tablet    Sig: Take 1 tablet (80 mg total) by mouth every evening.    Dispense:  90 tablet     Refill:  3   Medications Discontinued During This Encounter  Medication Reason   valsartan (DIOVAN) 80 MG tablet Reorder     Recommendations:   Gregory [name]  is a 77 y.o.  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  Patient was seen 6 weeks ago for acute decompensated heart failure.    He now presents for follow-up and has noticed marked improvement in overall wellbeing, dyspnea and leg edema has resolved.  Patient is in Remote Patient Monitoring and Principal Care Management as patient is high risk for hospitalization and complications from underlying medical conditions.   1. Chronic combined systolic and diastolic heart failure ([place]) Patient is presently doing well, he is weighing himself on a daily basis, he has not had any significant leg edema, dyspnea has remained stable, overall he feels the best he has in quite a while.  I refilled his prescription for valsartan, in spite of stage III/IV chronic kidney disease, he is tolerating low-dose of valsartan without any complications.  Continue the same.  2. Essential hypertension Blood pressure is under excellent control.  Continue present medical therapy.  3. Permanent atrial fibrillation [facility]) Patient is in permanent atrial fibrillation, he was previously in clinical trial with factor XIa inhibitor however the trial has prematurely stopped and he is now back on warfarin.  INR is supratherapeutic.  Dose adjustments were done, will continue to follow-up in our Coumadin clinic in the office.  Patient enrolled in RPM for heart failure.  Will continue with RPM for now elevated midday great impact with regard to his overall wellbeing and reducing hospitalization.  RPM Enrolled December Data 2023 Average  Weight Level 261.84 lbs Lowest Weight Level 258.8 lbs Highest Weight Level 266 lbs  Description   06/25/2022    INR today was 4.6. Patient goal level is (2.0-3.0)  Prior dose was 2.5 mg Wednesday and Friday and 5 mg all the other days.  Patient will now start on 5 mg Tuesday, Thursday, and Saturday, and 2.5 mg all the other days.   Pt will recheck INR 2 weeks on 01/16.   Lab Results      Component                Value               Date                      INR                      4.6 (A)             06/25/2022  INR                      1.6 (A)             10/29/2021                INR                      2.6                 10/26/2021              Permanent atrial fibrillation ([place]) Plan: POCT INR        Adrian Prows, MD, [facility] 06/25/2022, 1:48 PM Office: [phone] Fax: [phone] Pager: [phone]

## 2022-06-25 NOTE — Telephone Encounter (Signed)
Patient's weight overall has been stable at home.   Average Weight Level 261.67 lbs Lowest Weight Level 258.8 lbs Highest Weight Level 266 lbs  06/24/2022 Monday at 10:16 AM 260.2      06/23/2022 'Sunday at 10:09 AM 260.6      06/22/2022 Saturday at 10:19 AM 261.6      06/21/2022 Friday at 09:58 AM 261.6      06/20/2022 Thursday at 09:48 AM 261.8      06/19/2022 Wednesday at 09:50 AM 266      06/15/2022 Saturday at 09:40 AM 262      06/14/2022 Friday at 10:43 AM 259.8      06/13/2022 Thursday at 10:32 AM 261.8      06/12/2022 Wednesday at 09:50 AM 262      06/11/2022 Tuesday at 10:16 AM 262.2      06/10/2022 Monday at 10:09 AM 262.8      06/09/2022 Sunday at 11:31 AM 261.8      12'$ /16/2023 Saturday at 10:05 AM 261.6

## 2022-07-09 ENCOUNTER — Ambulatory Visit: Payer: Medicare Other | Admitting: Cardiology

## 2022-07-09 DIAGNOSIS — I4821 Permanent atrial fibrillation: Secondary | ICD-10-CM

## 2022-07-09 LAB — POCT INR: INR: 4 — AB (ref 2.0–3.0)

## 2022-07-09 NOTE — Progress Notes (Signed)
Description   07/09/2022   INR today was 4.0. Patient goal level is (2.0-3.0)  Prior dose was 5 mg Tuesday, Thursday, and Saturday, and 2.5 mg all the other days. But patient mention he was doing 2.5 Tuesday, Thursday, and Saturday, and 5 mg all the other days.  Patient will now start on 5 mg Monday Wednesday and Friday and 5 mg all the other days. Patient will also hold for 3 days (01/17-01/19) patient will start taking his medication in the evenings.   Pt will recheck INR 12 days on 01/29.   Lab Results      Component                Value               Date                      INR                      4.6 (A)             06/25/2022                INR                      1.6 (A)             10/29/2021                INR                      2.6                 10/26/2021             Permanent atrial fibrillation (HCC) Plan: POCT INR

## 2022-07-22 ENCOUNTER — Ambulatory Visit: Payer: Medicare Other | Admitting: Cardiology

## 2022-07-22 DIAGNOSIS — I4821 Permanent atrial fibrillation: Secondary | ICD-10-CM

## 2022-07-22 LAB — POCT INR: INR: 2.9 (ref 2.0–3.0)

## 2022-07-22 NOTE — Progress Notes (Signed)
Description   07/22/2022    INR today was 2.9. Patient goal level is (2.0-3.0)  Prior dose was 5 mg Mondays, Wednesday, Fridays and 2.5 mg all the other days.  Patient will now continue 5 mg Mondays, Wednesday, Fridays and 2.5 mg all the other days.  Pt will recheck INR 2 weeks on 02/12.   Lab Results      Component                Value               Date                      INR                      2.9                 07/22/2022                INR                      4.0 (A)             07/09/2022                INR                      4.6 (A)             06/25/2022             Permanent atrial fibrillation ([place]) Plan: POCT INR      ICD-10-CM   1. Permanent atrial fibrillation (HCC)  I48.21 POCT INR

## 2022-08-05 ENCOUNTER — Ambulatory Visit: Payer: Medicare Other | Admitting: Cardiology

## 2022-08-05 DIAGNOSIS — I4821 Permanent atrial fibrillation: Secondary | ICD-10-CM

## 2022-08-05 LAB — POCT INR: POC INR: 3.1

## 2022-08-05 NOTE — Progress Notes (Signed)
Description   08/05/2022    INR today was 3.1. Patient goal level is (2.0-3.0)  Prior dose was 5 mg Monday, Wednesday, Friday and 2.5 mg all the other days.  patient will continue with this dosage plan.  Pt will recheck INR 4 weeks on 09/02/2022.  Lab Results      Component                Value               Date                      INR                      3.1                 08/05/2022                INR                      2.9                 07/22/2022                INR                      4.0 (A)             07/09/2022            Permanent atrial fibrillation ([place])  (primary encounter diagnosis) Plan: POCT INR

## 2022-08-12 ENCOUNTER — Other Ambulatory Visit: Payer: Self-pay | Admitting: Cardiology

## 2022-08-12 DIAGNOSIS — I10 Essential (primary) hypertension: Secondary | ICD-10-CM

## 2022-08-15 ENCOUNTER — Other Ambulatory Visit: Payer: Self-pay | Admitting: Cardiology

## 2022-08-15 DIAGNOSIS — I10 Essential (primary) hypertension: Secondary | ICD-10-CM

## 2022-08-18 ENCOUNTER — Other Ambulatory Visit: Payer: Self-pay | Admitting: Cardiology

## 2022-08-19 ENCOUNTER — Other Ambulatory Visit: Payer: Self-pay

## 2022-08-19 MED ORDER — FUROSEMIDE 20 MG PO TABS: 20.0000 mg | ORAL_TABLET | Freq: Every morning | ORAL | 2 refills | Status: DC

## 2022-08-23 ENCOUNTER — Emergency Department (HOSPITAL_COMMUNITY): Payer: Medicare Other

## 2022-08-23 ENCOUNTER — Observation Stay (HOSPITAL_COMMUNITY): Payer: Medicare Other

## 2022-08-23 ENCOUNTER — Telehealth: Payer: Self-pay | Admitting: Cardiology

## 2022-08-23 ENCOUNTER — Other Ambulatory Visit: Payer: Self-pay

## 2022-08-23 ENCOUNTER — Encounter (HOSPITAL_COMMUNITY): Payer: Self-pay

## 2022-08-23 ENCOUNTER — Observation Stay (HOSPITAL_COMMUNITY)
Admission: EM | Admit: 2022-08-23 | Discharge: 2022-08-23 | Disposition: A | Discharge status: Home / Self Care | Payer: Medicare Other | Attending: Internal Medicine | Admitting: Internal Medicine

## 2022-08-23 DIAGNOSIS — I509 Heart failure, unspecified: Principal | ICD-10-CM | POA: Insufficient documentation

## 2022-08-23 DIAGNOSIS — R0602 Shortness of breath: Secondary | ICD-10-CM | POA: Diagnosis present

## 2022-08-23 DIAGNOSIS — J449 Chronic obstructive pulmonary disease, unspecified: Secondary | ICD-10-CM | POA: Diagnosis present

## 2022-08-23 DIAGNOSIS — R791 Abnormal coagulation profile: Secondary | ICD-10-CM | POA: Insufficient documentation

## 2022-08-23 DIAGNOSIS — I10 Essential (primary) hypertension: Secondary | ICD-10-CM | POA: Diagnosis present

## 2022-08-23 DIAGNOSIS — E7800 Pure hypercholesterolemia, unspecified: Secondary | ICD-10-CM | POA: Diagnosis not present

## 2022-08-23 DIAGNOSIS — I5043 Acute on chronic combined systolic (congestive) and diastolic (congestive) heart failure: Secondary | ICD-10-CM | POA: Diagnosis present

## 2022-08-23 DIAGNOSIS — I2510 Atherosclerotic heart disease of native coronary artery without angina pectoris: Secondary | ICD-10-CM | POA: Diagnosis present

## 2022-08-23 DIAGNOSIS — I252 Old myocardial infarction: Secondary | ICD-10-CM | POA: Diagnosis not present

## 2022-08-23 DIAGNOSIS — Z20822 Contact with and (suspected) exposure to covid-19: Secondary | ICD-10-CM | POA: Diagnosis not present

## 2022-08-23 DIAGNOSIS — Z7984 Long term (current) use of oral hypoglycemic drugs: Secondary | ICD-10-CM | POA: Insufficient documentation

## 2022-08-23 DIAGNOSIS — I110 Hypertensive heart disease with heart failure: Secondary | ICD-10-CM | POA: Insufficient documentation

## 2022-08-23 DIAGNOSIS — E119 Type 2 diabetes mellitus without complications: Secondary | ICD-10-CM | POA: Diagnosis not present

## 2022-08-23 DIAGNOSIS — I4891 Unspecified atrial fibrillation: Secondary | ICD-10-CM | POA: Diagnosis not present

## 2022-08-23 DIAGNOSIS — Z79899 Other long term (current) drug therapy: Secondary | ICD-10-CM | POA: Insufficient documentation

## 2022-08-23 LAB — BASIC METABOLIC PANEL
Anion gap: 13 (ref 5–15)
BUN: 23 mg/dL (ref 8–23)
CO2: 25 mmol/L (ref 22–32)
Calcium: 7.8 mg/dL — ABNORMAL LOW (ref 8.9–10.3)
Chloride: 100 mmol/L (ref 98–111)
Creatinine, Ser: 1.6 mg/dL — ABNORMAL HIGH (ref 0.61–1.24)
GFR, Estimated: 44 mL/min — ABNORMAL LOW (ref >60)
Glucose, Bld: 109 mg/dL — ABNORMAL HIGH (ref 70–99)
Potassium: 3.6 mmol/L (ref 3.5–5.1)
Sodium: 138 mmol/L (ref 135–145)

## 2022-08-23 LAB — RESP PANEL BY RT-PCR (RSV, FLU A&B, COVID)  RVPGX2
Influenza A by PCR: NEGATIVE
Influenza B by PCR: NEGATIVE
Resp Syncytial Virus by PCR: NEGATIVE
SARS Coronavirus 2 by RT PCR: NEGATIVE

## 2022-08-23 LAB — CBC
HCT: 46 % (ref 39.0–52.0)
Hemoglobin: 14.4 g/dL (ref 13.0–17.0)
MCH: 29.6 pg (ref 26.0–34.0)
MCHC: 31.3 g/dL (ref 30.0–36.0)
MCV: 94.5 fL (ref 80.0–100.0)
Platelets: 198 10*3/uL (ref 150–400)
RBC: 4.87 MIL/uL (ref 4.22–5.81)
RDW: 14.7 % (ref 11.5–15.5)
WBC: 11.2 10*3/uL — ABNORMAL HIGH (ref 4.0–10.5)
nRBC: 0 % (ref 0.0–0.2)

## 2022-08-23 LAB — TROPONIN I (HIGH SENSITIVITY)
Troponin I (High Sensitivity): 20 ng/L — ABNORMAL HIGH (ref <18)
Troponin I (High Sensitivity): 27 ng/L — ABNORMAL HIGH (ref <18)

## 2022-08-23 LAB — PROTIME-INR
INR: 6.4 (ref 0.8–1.2)
Prothrombin Time: 56.1 seconds — ABNORMAL HIGH (ref 11.4–15.2)

## 2022-08-23 LAB — MAGNESIUM: Magnesium: 1.8 mg/dL (ref 1.7–2.4)

## 2022-08-23 LAB — BRAIN NATRIURETIC PEPTIDE: B Natriuretic Peptide: 823.2 pg/mL — ABNORMAL HIGH (ref 0.0–100.0)

## 2022-08-23 MED ORDER — CARVEDILOL 12.5 MG PO TABS: 37.5000 mg | ORAL_TABLET | Freq: Two times a day (BID) | ORAL | Status: DC

## 2022-08-23 MED ORDER — POLYETHYLENE GLYCOL 3350 17 G PO PACK: 17.0000 g | PACK | Freq: Every day | ORAL | Status: DC | PRN

## 2022-08-23 MED ORDER — SPIRONOLACTONE 12.5 MG HALF TABLET: 25.0000 mg | ORAL_TABLET | Freq: Every morning | ORAL | Status: DC

## 2022-08-23 MED ORDER — NITROGLYCERIN 2 % TD OINT
1.0000 [in_us] | TOPICAL_OINTMENT | Freq: Four times a day (QID) | TRANSDERMAL | Status: DC
  Administered 2022-08-23: 1 [in_us] via TOPICAL
  Filled 2022-08-23: qty 1

## 2022-08-23 MED ORDER — SODIUM CHLORIDE 0.9% FLUSH: 3.0000 mL | Freq: Two times a day (BID) | INTRAVENOUS | Status: DC

## 2022-08-23 MED ORDER — DILTIAZEM HCL 25 MG/5ML IV SOLN
20.0000 mg | Freq: Once | INTRAVENOUS | Status: AC
  Administered 2022-08-23: 20 mg via INTRAVENOUS
  Filled 2022-08-23: qty 5

## 2022-08-23 MED ORDER — DAPAGLIFLOZIN PROPANEDIOL 10 MG PO TABS: 10.0000 mg | ORAL_TABLET | Freq: Every day | ORAL | Status: DC

## 2022-08-23 MED ORDER — FUROSEMIDE 10 MG/ML IJ SOLN
40.0000 mg | Freq: Once | INTRAMUSCULAR | Status: AC
  Administered 2022-08-23: 40 mg via INTRAVENOUS
  Filled 2022-08-23: qty 4

## 2022-08-23 MED ORDER — ATORVASTATIN CALCIUM 10 MG PO TABS: 20.0000 mg | ORAL_TABLET | Freq: Every evening | ORAL | Status: DC

## 2022-08-23 MED ORDER — ACETAMINOPHEN 325 MG PO TABS: 650.0000 mg | ORAL_TABLET | Freq: Four times a day (QID) | ORAL | Status: DC | PRN

## 2022-08-23 MED ORDER — FUROSEMIDE 10 MG/ML IJ SOLN
20.0000 mg | Freq: Once | INTRAMUSCULAR | Status: AC
  Administered 2022-08-23: 20 mg via INTRAVENOUS
  Filled 2022-08-23: qty 2

## 2022-08-23 MED ORDER — IRBESARTAN 75 MG PO TABS: 75.0000 mg | ORAL_TABLET | Freq: Every evening | ORAL | Status: DC

## 2022-08-23 MED ORDER — POTASSIUM CHLORIDE CRYS ER 20 MEQ PO TBCR
20.0000 meq | EXTENDED_RELEASE_TABLET | Freq: Once | ORAL | Status: AC
  Administered 2022-08-23: 20 meq via ORAL
  Filled 2022-08-23: qty 1

## 2022-08-23 MED ORDER — ACETAMINOPHEN 650 MG RE SUPP: 650.0000 mg | Freq: Four times a day (QID) | RECTAL | Status: DC | PRN

## 2022-08-23 MED ORDER — HYDRALAZINE HCL 100 MG PO TABS: 100.0000 mg | ORAL_TABLET | Freq: Two times a day (BID) | ORAL | Status: DC

## 2022-08-23 MED ORDER — FUROSEMIDE 10 MG/ML IJ SOLN: 40.0000 mg | Freq: Two times a day (BID) | INTRAMUSCULAR | Status: DC

## 2022-08-23 NOTE — Consult Note (Signed)
History and Physical   Gregory [name] [id number] DOB: 1946/01/22 DOA: 08/23/2022  PCP: Sonia Side., FNP   Patient coming from: Home  Chief Complaint: Shortness of breath  HPI: Gregory [name] is a 77 y.o. male with medical history significant of COPD, CAD status post stenting, hypertension, hyperlipidemia, permanent A-fib, CHF, CKD 3B, presenting with shortness of breath.  Patient states that he started have significant short of breath yesterday and it is worse with activity.  States he has been adherent to his daily Lasix but does have concern for CHF exacerbation given previous symptoms.  Also reporting worsening edema.  He denies fevers, chills, chest pain, abdominal pain, constipation, diarrhea, nausea, vomiting.  ED Course: Vital signs in the ED significant for blood pressure in the [id number] to [id number] systolic, heart rate in the 90s to 100s, respiratory rate in the 20s.  Lab workup included BMP with creatinine stable at 1.6, glucose 109, calcium 7.9.  CBC with leukocytosis 11.2.  PT and INR elevated at 56.1 and 6.4 respectively.  BNP elevated to 823.  Troponin mildly elevated at 20 with repeat pending.  Respiratory panel for flu COVID and RSV pending.  Chest x-ray showing cardiomegaly, vascular congestion and basilar atelectasis.  Patient received total of 60 mg IV Lasix in the ED.  Also received dose of IV diltiazem due to tachycardia, Nitropaste, 20 mill equivalents p.o. potassium.  Patient later reporting he did not want to be admitted due to inability to obtain care for his wife in his absence.  Review of Systems: As per HPI otherwise all other systems reviewed and are negative.  Past Medical History:  Diagnosis Date   CAD (coronary artery disease)    COPD (chronic obstructive pulmonary disease) ([place])    Diabetes mellitus    DOE (dyspnea on exertion) 09/23/2012   CXR 09/2012:  Basilar atx.  Ambulatory ox 09/2012:  desat to 86% on RA.  Now normal.   V/Q 2014:  Normal perfusion.     Heart attack ([place])    Hyperlipidemia    Hypertension    Neuromuscular disorder ([place])    SOB (shortness of breath) on exertion     Past Surgical History:  Procedure Laterality Date   BACK SURGERY     CARDIAC CATHETERIZATION     CORONARY STENT PLACEMENT     KNEE ARTHROPLASTY     LEFT HEART CATH AND CORONARY ANGIOGRAPHY N/A 03/19/2019   Procedure: LEFT HEART CATH AND CORONARY ANGIOGRAPHY;  Surgeon: Nigel Mormon, MD;  Location: [place] [facility] LAB;  Service: Cardiovascular;  Laterality: N/A;   LEFT HEART CATHETERIZATION WITH CORONARY ANGIOGRAM N/A 03/03/2012   Procedure: LEFT HEART CATHETERIZATION WITH CORONARY ANGIOGRAM;  Surgeon: Laverda Page, MD;  Location: [facility] CATH LAB;  Service: Cardiovascular;  Laterality: N/A;   PERCUTANEOUS CORONARY STENT INTERVENTION (PCI-S)  03/03/2012   Procedure: PERCUTANEOUS CORONARY STENT INTERVENTION (PCI-S);  Surgeon: Laverda Page, MD;  Location: [facility] CATH LAB;  Service: Cardiovascular;;    Social History  reports that he quit smoking about 19 years ago. His smoking use included cigarettes. He has a 30.00 pack-year smoking history. He has never used smokeless tobacco. He reports current alcohol use. He reports that he does not use drugs.  No Known Allergies  Family History  Problem Relation Age of Onset   Hypertension Father    Hyperlipidemia Father    Stroke Father    Hypertension Paternal Grandfather    Hyperlipidemia Paternal Grandfather    Diabetes Other  maternal uncles   Diabetes Sister    Cancer Mother    Colon cancer Neg Hx   Reviewed on admission  Prior to Admission medications   Medication Sig Start Date End Date Taking? Authorizing Provider  allopurinol (ZYLOPRIM) 300 MG tablet Take 300 mg by mouth daily. 06/09/20   [provider]  atorvastatin (LIPITOR) 20 MG tablet TAKE 1 TABLET BY MOUTH ONCE DAILY AT  6PM 03/22/20   Adrian Prows, MD  carvedilol (COREG) 25 MG tablet Take 1.5 tablets (37.5 mg total) by mouth  2 (two) times daily. 04/12/22   Adrian Prows, MD  dapagliflozin propanediol (FARXIGA) 10 MG TABS tablet Take 1 tablet (10 mg total) by mouth daily before breakfast. 04/12/22   Adrian Prows, MD  furosemide (LASIX) 20 MG tablet Take 1 tablet (20 mg total) by mouth every morning. 08/19/22   Adrian Prows, MD  glipiZIDE (GLUCOTROL) 5 MG tablet Take 5 mg by mouth daily before breakfast.    [provider]  hydrALAZINE (APRESOLINE) 100 MG tablet Take 100 mg by mouth 2 (two) times daily.  10/06/16   [provider]  [facility] VERIO test strip 1 each daily. 09/29/20   [provider]  spironolactone (ALDACTONE) 25 MG tablet TAKE 1 TABLET BY MOUTH ONCE DAILY IN THE MORNING 08/12/22   Adrian Prows, MD  Tamsulosin HCl (FLOMAX) 0.4 MG CAPS Take 0.4 mg by mouth daily.    [provider]  valsartan (DIOVAN) 80 MG tablet Take 1 tablet (80 mg total) by mouth every evening. 06/25/22   Adrian Prows, MD  warfarin (COUMADIN) 2.5 MG tablet Take 1 tablet (2.5 mg total) by mouth as directed. 2.5 mg  every Wed, Fri as directed 06/03/22   Adrian Prows, MD  warfarin (COUMADIN) 5 MG tablet Take 1 tablet (5 mg total) by mouth as directed. 5 mg (1 tablet) daily except Wednesday and Friday 2.5 mg. 06/03/22   Adrian Prows, MD    Physical Exam: Vitals:   08/23/22 1118  BP: (!) 160/82  Pulse: 62  Resp: 19  Temp: 99 F (37.2 C)  TempSrc: Oral  SpO2: 98%  Weight: 120.2 kg  Height: '5\' 11"'$  (1.803 m)    Physical Exam Constitutional:      General: He is not in acute distress.    Appearance: Normal appearance.  HENT:     Head: Normocephalic and atraumatic.     Mouth/Throat:     Mouth: Mucous membranes are moist.     Pharynx: Oropharynx is clear.  Eyes:     Extraocular Movements: Extraocular movements intact.     Pupils: Pupils are equal, round, and reactive to light.  Cardiovascular:     Rate and Rhythm: Normal rate. Rhythm irregular.     Pulses: Normal pulses.     Heart sounds: Normal heart sounds.   Pulmonary:     Effort: Pulmonary effort is normal. No respiratory distress.     Breath sounds: Normal breath sounds. No rales.  Abdominal:     General: Bowel sounds are normal. There is no distension.     Palpations: Abdomen is soft.     Tenderness: There is no abdominal tenderness.  Musculoskeletal:        General: No swelling or deformity.     Right lower leg: Edema present.     Left lower leg: Edema present.  Skin:    General: Skin is warm and dry.  Neurological:     General: No focal deficit present.  Mental Status: Mental status is at baseline.    Labs on Admission: I have personally reviewed following labs and imaging studies  CBC: Recent Labs  Lab 08/23/22 1128  WBC 11.2*  HGB 14.4  HCT 46.0  MCV 94.5  PLT [id number]    Basic Metabolic Panel: Recent Labs  Lab 08/23/22 1128  NA 138  K 3.6  CL 100  CO2 25  GLUCOSE 109*  BUN 23  CREATININE 1.60*  CALCIUM 7.8*  MG 1.8    GFR: Estimated Creatinine Clearance: 51 mL/min (A) (by C-G formula based on SCr of 1.6 mg/dL (H)).  Liver Function Tests: No results for input(s): "AST", "ALT", "ALKPHOS", "BILITOT", "PROT", "ALBUMIN" in the last 168 hours.  Urine analysis: No results found for: "COLORURINE", "APPEARANCEUR", "LABSPEC", "PHURINE", "GLUCOSEU", "HGBUR", "BILIRUBINUR", "KETONESUR", "PROTEINUR", "UROBILINOGEN", "NITRITE", "LEUKOCYTESUR"  Radiological Exams on Admission: DG Chest Portable 1 View  Result Date: 08/23/2022 CLINICAL DATA:  Dyspnea. EXAM: PORTABLE CHEST 1 VIEW COMPARISON:  Radiographs 09/09/2019 and 03/18/2019.  CT 09/01/2018. FINDINGS: 1155 hours. Stable cardiomegaly and aortic atherosclerosis. There is vascular congestion with mild bibasilar atelectasis. No confluent airspace opacity, pneumothorax or significant pleural effusion identified. The bones appear unchanged. There are mild degenerative changes in the spine associated with a mild convex right thoracic scoliosis. Telemetry leads overlie the  chest. IMPRESSION: Cardiomegaly with vascular congestion and mild bibasilar atelectasis. No overt edema or focal airspace opacity. Electronically Signed   By: Richardean Sale M.D.   On: 08/23/2022 12:03    EKG: Independently reviewed.  A-fib atrial fibrillation at 111 bpm.  Partial run of ventricular bigeminy.  Low voltage multiple leads.  Assessment/Plan Principal Problem:   Acute on chronic combined systolic (congestive) and diastolic (congestive) heart failure (HCC) Active Problems:   HYPERCHOLESTEROLEMIA  IIA   Essential hypertension   CAD, NATIVE VESSEL   COPD mixed type (HCC)   Acute on chronic combined systolic and diastolic CHF > Last echo that I can see is 2021 with EF 40-45%, indeterminate diastolic function. > Presenting with shortness of breath and increasing edema.  Noted to have BNP of 823.  Chest x-ray with cardiomegaly and vascular congestion. > Also having some intermittent tachycardia/RVR in the setting of permanent A-fib which could be contributing to exacerbation as patient continues to take medication as prescribed. > Received 60 mg IV Lasix in the ED as well as 20 mill equivalents p.o. potassium. - Recommended admission but patient is unable to find someone to care for his wife and so he insist on going home.   - Considering he does not have significant rales on exam nor is hypoxia and has already received IV diuresis, I think it is reasonable for him to go home with return precautions and close follow-up. - Continue with home diuretic regimen - Close follow-up with cardiologist. - Patient to be discharged from ED.  Supratherapeutic INR > Noted to have INR of 6.4 in the ED.  No evidence of current bleeding and hemoglobin stable. - Hold warfarin - Close follow up for INR monitoring, follow-up arranged for Monday, 08/26/2022.  Permanent atrial fibrillation > Intermittently tachycardic in the ED.  Could be contributing to his CHF exacerbation.  Supratherapeutic INR as  above. - Continue home carvedilol - Holding home warfarin as above  CAD > Status post stenting - Continue home atorvastatin, carvedilol, valsartan  - Warfarin being held as above  Hypertension - Continue home carvedilol, spironolactone, hydralazine, valsartan - On IV Lasix as above  Hyperlipidemia - Continue home atorvastatin  CKD 3B > Creatinine stable at 1.6 in ED.   - Trend renal function and electrolytes  COPD - Noted in chart but no occasions prescribed for this  We appreciate this consultation, patient advised to return if symptoms worsen again.  Discussed case with patient's cardiologist to arrange closer follow-up, they will work to arrange follow-up for patient in 3 days on Monday, March 4.Marland Kitchen

## 2022-08-23 NOTE — ED Notes (Signed)
Patient A&ox4 without any confusion. Ambulatory. Patient states "I have no one to take care of wife, so staying over night will not work." MD made aware.

## 2022-08-23 NOTE — ED Provider Notes (Addendum)
[facility] Provider Note   CSN: [id number] Arrival date & time: 08/23/22  1112     History  Chief Complaint  Patient presents with   SOB    Gregory [name] is a 77 y.o. male.  HPI   Patient has a history of hypertension hyperlipidemia, MI, diabetes, COPD, coronary artery disease.  Patient presents to the ED for evaluation of shortness of breath.  Patient states he started having symptoms yesterday.  He is getting very short of breath at rest and gets worse with any activity.  Patient has been compliant with his Lasix.  He feels like he is having an exacerbation of his CHF.  He has not noticed any more leg swelling than usual.  He denies having any fevers or coughing.  He denies any chest pain.  Home Medications Prior to Admission medications   Medication Sig Start Date End Date Taking? Authorizing Provider  allopurinol (ZYLOPRIM) 300 MG tablet Take 300 mg by mouth daily. 06/09/20   [provider]  atorvastatin (LIPITOR) 20 MG tablet TAKE 1 TABLET BY MOUTH ONCE DAILY AT  6PM 03/22/20   Adrian Prows, MD  carvedilol (COREG) 25 MG tablet Take 1.5 tablets (37.5 mg total) by mouth 2 (two) times daily. 04/12/22   Adrian Prows, MD  dapagliflozin propanediol (FARXIGA) 10 MG TABS tablet Take 1 tablet (10 mg total) by mouth daily before breakfast. 04/12/22   Adrian Prows, MD  furosemide (LASIX) 20 MG tablet Take 1 tablet (20 mg total) by mouth every morning. 08/19/22   Adrian Prows, MD  glipiZIDE (GLUCOTROL) 5 MG tablet Take 5 mg by mouth daily before breakfast.    [provider]  hydrALAZINE (APRESOLINE) 100 MG tablet Take 100 mg by mouth 2 (two) times daily.  10/06/16   [provider]  [facility] VERIO test strip 1 each daily. 09/29/20   [provider]  spironolactone (ALDACTONE) 25 MG tablet TAKE 1 TABLET BY MOUTH ONCE DAILY IN THE MORNING 08/12/22   Adrian Prows, MD  Tamsulosin HCl (FLOMAX) 0.4 MG CAPS Take 0.4 mg by mouth  daily.    [provider]  valsartan (DIOVAN) 80 MG tablet Take 1 tablet (80 mg total) by mouth every evening. 06/25/22   Adrian Prows, MD  warfarin (COUMADIN) 2.5 MG tablet Take 1 tablet (2.5 mg total) by mouth as directed. 2.5 mg  every Wed, Fri as directed 06/03/22   Adrian Prows, MD  warfarin (COUMADIN) 5 MG tablet Take 1 tablet (5 mg total) by mouth as directed. 5 mg (1 tablet) daily except Wednesday and Friday 2.5 mg. 06/03/22   Adrian Prows, MD      Allergies    Patient has no known allergies.    Review of Systems   Review of Systems  Physical Exam Updated Vital Signs BP (!) 160/82   Pulse 62   Temp 99 F (37.2 C) (Oral)   Resp 19   Ht 1.803 m ('5\' 11"'$ )   Wt 120.2 kg   SpO2 98%   BMI 36.96 kg/m  Physical Exam Vitals and nursing note reviewed.  Constitutional:      General: He is not in acute distress.    Appearance: He is well-developed.  HENT:     Head: Normocephalic and atraumatic.     Right Ear: External ear normal.     Left Ear: External ear normal.  Eyes:     General: No scleral icterus.  Right eye: No discharge.        Left eye: No discharge.     Conjunctiva/sclera: Conjunctivae normal.  Neck:     Trachea: No tracheal deviation.  Cardiovascular:     Rate and Rhythm: Tachycardia present. Rhythm irregular.  Pulmonary:     Effort: Accessory muscle usage present. No respiratory distress.     Breath sounds: No stridor. Rales present. No wheezing.  Abdominal:     General: Bowel sounds are normal. There is no distension.     Palpations: Abdomen is soft.     Tenderness: There is no abdominal tenderness. There is no guarding or rebound.  Musculoskeletal:        General: No tenderness or deformity.     Cervical back: Neck supple.     Right lower leg: Edema present.     Left lower leg: Edema present.  Skin:    General: Skin is warm and dry.     Findings: No rash.  Neurological:     General: No focal deficit present.     Mental Status: He is alert.      Cranial Nerves: No cranial nerve deficit, dysarthria or facial asymmetry.     Sensory: No sensory deficit.     Motor: No abnormal muscle tone or seizure activity.     Coordination: Coordination normal.  Psychiatric:        Mood and Affect: Mood normal.     ED Results / Procedures / Treatments   Labs (all labs ordered are listed, but only abnormal results are displayed) Labs Reviewed  BASIC METABOLIC PANEL - Abnormal; Notable for the following components:      Result Value   Glucose, Bld 109 (*)    Creatinine, Ser 1.60 (*)    Calcium 7.8 (*)    GFR, Estimated 44 (*)    All other components within normal limits  BRAIN NATRIURETIC PEPTIDE - Abnormal; Notable for the following components:   B Natriuretic Peptide 823.2 (*)    All other components within normal limits  CBC - Abnormal; Notable for the following components:   WBC 11.2 (*)    All other components within normal limits  PROTIME-INR - Abnormal; Notable for the following components:   Prothrombin Time 56.1 (*)    INR 6.4 (*)    All other components within normal limits  TROPONIN I (HIGH SENSITIVITY) - Abnormal; Notable for the following components:   Troponin I (High Sensitivity) 20 (*)    All other components within normal limits  RESP PANEL BY RT-PCR (RSV, FLU A&B, COVID)  [id number]  MAGNESIUM  TROPONIN I (HIGH SENSITIVITY)    EKG EKG Interpretation  Date/Time:  Friday August 23 2022 12:03:52 EST Ventricular Rate:  111 PR Interval:  110 QRS Duration: 105 QT Interval:  365 QTC Calculation: 432 R Axis:   59 Text Interpretation: Atrial fibrillation Ventricular bigeminy Low voltage, extremity and precordial leads Since last tracing rate slower Confirmed by Dorie Rank [phone]) on 08/23/2022 12:08:18 PM  Radiology DG Chest Portable 1 View  Result Date: 08/23/2022 CLINICAL DATA:  Dyspnea. EXAM: PORTABLE CHEST 1 VIEW COMPARISON:  Radiographs 09/09/2019 and 03/18/2019.  CT 09/01/2018. FINDINGS: 1155 hours. Stable  cardiomegaly and aortic atherosclerosis. There is vascular congestion with mild bibasilar atelectasis. No confluent airspace opacity, pneumothorax or significant pleural effusion identified. The bones appear unchanged. There are mild degenerative changes in the spine associated with a mild convex right thoracic scoliosis. Telemetry leads overlie the chest. IMPRESSION: Cardiomegaly with vascular congestion  and mild bibasilar atelectasis. No overt edema or focal airspace opacity. Electronically Signed   By: Richardean Sale M.D.   On: 08/23/2022 12:03    Procedures Procedures    Medications Ordered in ED Medications  nitroGLYCERIN (NITROGLYN) 2 % ointment 1 inch (1 inch Topical Given 08/23/22 1137)  furosemide (LASIX) injection 40 mg (40 mg Intravenous Given 08/23/22 1137)  potassium chloride SA (KLOR-CON M) CR tablet 20 mEq (20 mEq Oral Given 08/23/22 1137)  diltiazem (CARDIZEM) injection 20 mg (20 mg Intravenous Given 08/23/22 1153)  furosemide (LASIX) injection 20 mg (20 mg Intravenous Given 08/23/22 1300)    ED Course/ Medical Decision Making/ A&P Clinical Course as of 08/23/22 1342  Fri Aug 23, 2022  1144 Heart rate in the 130s on the monitor.  Pulses is not 62 [JK]  1238 DG Chest Portable 1 View Chest x-ray shows cardiomegaly and vascular congestion [JK]  1238 CBC(!) CBC with slight leukocytosis [JK]  [id number] Basic metabolic panel(!) Creatinine elevated but similar to previous values [JK]  1310 Patient noting some improved meant with treatment. [id number] D/w Dr Trilby Drummer regarding admission [JK]    Clinical Course User Index [JK] Dorie Rank, MD                             Medical Decision Making Problems Addressed: Acute on chronic congestive heart failure, unspecified heart failure type [facility]): acute illness or injury that poses a threat to life or bodily functions Atrial fibrillation, unspecified type [facility]): chronic illness or injury  Amount and/or Complexity of Data Reviewed Labs:  ordered. Decision-making details documented in ED Course. Radiology: ordered and independent interpretation performed. Decision-making details documented in ED Course.  Risk Prescription drug management. Decision regarding hospitalization.   Patient presented to the ED with complaints of shortness of breath.  Patient felt that his symptoms were related to CHF exacerbation.  Patient not having any complaints of chest pain.  No fevers or chills to suggest infection.  Patient's labs notable for elevated BNP.  His INR was also supratherapeutic but no signs of any acute blood loss.  Troponin slightly elevated but doubt ACS.  He is not having any chest pain and suspect this is related to CHF exacerbation.  Patient was treated with IV diuretics and has noted some improvement.  Patient was also notably tachycardic on arrival.  He was given a dose of Cardizem to help with his rate control.  Suspect his atrial fibrillation could have also been contributing to the CHF exacerbation.  Will consult the medical service for admission and further treatment        Final Clinical Impression(s) / ED Diagnoses Final diagnoses:  Acute on chronic congestive heart failure, unspecified heart failure type [facility])  Atrial fibrillation, unspecified type ([place])  Supratherapeutic INR    Rx / DC Orders ED Discharge Orders     None         Dorie Rank, MD 08/23/22 1342  Patient states he tried to find someone to take care of his wife.  She is not able to be at home by herself.  He was not able to find anyone.  He does not want to be admitted to the hospital.  Patient states will increase his Lasix dose to twice daily.  I will have him hold his Coumadin because of his elevated INR.  Patient will follow-up with his cardiologist closely.  He understands he can return at any time  if he changes his mind and especially starts feeling worse.    Dorie Rank, MD 08/23/22 [phone]

## 2022-08-23 NOTE — ED Triage Notes (Signed)
Pt came in via POV d/t reporting SOB/difficulty breathing without exertion that started yesterday. He does take lasix & has not missed a dose, doubled his dose yesterday. A/Ox4, denies CP, Hx of CHF & A-Fib.

## 2022-08-23 NOTE — Discharge Instructions (Signed)
Increase your Lasix to 20 mg twice daily for the next 5 days.  Continue your other medications.  Monitor your atrial fibrillation to make sure your heart rate is not too elevated.  Follow-up with Dr. Einar Gip next week.  Please return at any time if you change your mind about being admitted to the hospital or have any worsening symptoms.  Your INR was also elevated today.  Hold you next dose and then resume your coumadin.

## 2022-08-23 NOTE — ED Notes (Signed)
ECG completed due to changes in ECG. ECG given to MD at this time

## 2022-08-28 ENCOUNTER — Ambulatory Visit: Payer: Medicare Other | Admitting: Cardiology

## 2022-08-28 ENCOUNTER — Telehealth: Payer: Self-pay

## 2022-08-28 ENCOUNTER — Encounter: Payer: Self-pay | Admitting: Cardiology

## 2022-08-28 VITALS — BP 143/93 | HR 64 | Resp 18 | Ht 71.0 in | Wt 269.6 lb

## 2022-08-28 DIAGNOSIS — E1122 Type 2 diabetes mellitus with diabetic chronic kidney disease: Secondary | ICD-10-CM

## 2022-08-28 DIAGNOSIS — I493 Ventricular premature depolarization: Secondary | ICD-10-CM

## 2022-08-28 DIAGNOSIS — I10 Essential (primary) hypertension: Secondary | ICD-10-CM

## 2022-08-28 DIAGNOSIS — I25118 Atherosclerotic heart disease of native coronary artery with other forms of angina pectoris: Secondary | ICD-10-CM

## 2022-08-28 DIAGNOSIS — I5043 Acute on chronic combined systolic (congestive) and diastolic (congestive) heart failure: Secondary | ICD-10-CM

## 2022-08-28 DIAGNOSIS — I4821 Permanent atrial fibrillation: Secondary | ICD-10-CM

## 2022-08-28 LAB — POCT INR: POC INR: 2.9

## 2022-08-28 MED ORDER — SEMAGLUTIDE(0.25 OR 0.5MG/DOS) 2 MG/1.5ML ~~LOC~~ SOPN
0.2500 mg | PEN_INJECTOR | Freq: Once | SUBCUTANEOUS | Status: AC
  Administered 2022-08-28: 0.25 mg via SUBCUTANEOUS

## 2022-08-28 MED ORDER — POTASSIUM CHLORIDE ER 10 MEQ PO TBCR: 10.0000 meq | EXTENDED_RELEASE_TABLET | Freq: Every day | ORAL | 1 refills | Status: DC

## 2022-08-28 MED ORDER — FUROSEMIDE 40 MG PO TABS: 40.0000 mg | ORAL_TABLET | Freq: Every morning | ORAL | 1 refills | Status: DC

## 2022-08-28 NOTE — Progress Notes (Unsigned)
Primary Physician/Referring:  Sonia Side., FNP  Patient ID: Gregory [name], male    DOB: April 18, 1946, 77 y.o.   MRN: [id number]  No chief complaint on file.  HPI:    Gregory [name]  is a 77 y.o. 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     Patient presented to the emergency room with worsening leg edema and dyspnea on 08/23/2022 and again found to be in acute decompensated heart failure, received Lasix and was recommended admission to the hospital but patient actively has had to take care of his pet.   He is feeling slightly better after he received IV Lasix, however upon discussion, he does admit to occasionally not eating great.  Most times he adhere to 1 meal a day however he snacks pretzels and peanut butter.  He does have chronic bilateral lower leg edema for which he wears compression stockings, this remained stable.  He is tolerating anticoagulation without bleeding diathesis.  Past Medical History:  Diagnosis Date   CAD (coronary artery disease)    COPD (chronic obstructive pulmonary disease) ([place])    Diabetes mellitus    DOE (dyspnea on exertion) 09/23/2012   CXR 09/2012:  Basilar atx.  Ambulatory ox 09/2012:  desat to 86% on RA.  Now normal.   V/Q 2014:  Normal perfusion.    Heart attack ([place])    Hyperlipidemia    Hypertension    Neuromuscular disorder ([place])    SOB (shortness of breath) on exertion    Past Surgical History:  Procedure Laterality Date   BACK SURGERY     CARDIAC CATHETERIZATION     CORONARY STENT PLACEMENT     KNEE ARTHROPLASTY     LEFT HEART CATH AND CORONARY ANGIOGRAPHY N/A 03/19/2019   Procedure: LEFT HEART CATH AND CORONARY ANGIOGRAPHY;  Surgeon: Nigel Mormon, MD;  Location: [facility];  Service:  Cardiovascular;  Laterality: N/A;   LEFT HEART CATHETERIZATION WITH CORONARY ANGIOGRAM N/A 03/03/2012   Procedure: LEFT HEART CATHETERIZATION WITH CORONARY ANGIOGRAM;  Surgeon: Laverda Page, MD;  Location: [facility] C[facility];  Service: Cardiovascular;  Laterality: N/A;   PERCUTANEOUS CORONARY STENT INTERVENTION (PCI-S)  03/03/2012   Procedure: PERCUTANEOUS CORONARY STENT INTERVENTION (PCI-S);  Surgeon: Laverda Page, MD;  Location: [facility] CATH LAB;  Service: Cardiovascular;;   Social History   Tobacco Use   Smoking status: Former    Packs/day: 1.00    Years: 30.00    Total pack years: 30.00    Types: Cigarettes    Quit date: 07/30/2003    Years since quitting: 19.0   Smokeless tobacco: Never  Substance Use Topics   Alcohol use: Yes    Alcohol/week: 0.0 standard drinks of alcohol    Comment: wine rarely    Marital Status: Married  ROS  Review of Systems  Cardiovascular:  Negative for chest pain and dyspnea on exertion. Leg swelling: stable.  Objective      08/23/2022   11:18 AM 06/25/2022    1:27 PM 06/25/2022    1:26 PM  Vitals with BMI  Height '5\' 11"'$   '5\' 11"'$   Weight 265 lbs  261 lbs  BMI [id number]  [id number]  Systolic 0000000 [id number]  Diastolic 82 70 80  Pulse 62 59 65    There were no vitals  taken for this visit. There is no height or weight on file to calculate BMI.   Physical Exam Vitals reviewed.  Constitutional:      Appearance: He is obese.     Comments: He is well-built and moderately obese in no acute distress.  Neck:     Vascular: No carotid bruit or JVD.  Cardiovascular:     Rate and Rhythm: Normal rate. Rhythm irregular.     Pulses: Normal pulses and intact distal pulses.     Heart sounds: No murmur heard.    No gallop. No S3 or S4 sounds.     Comments:   Pulmonary:     Effort: Pulmonary effort is normal.     Breath sounds: Normal breath sounds.  Abdominal:     General: Bowel sounds are normal.     Palpations: Abdomen is soft.     Comments: Obese.   Musculoskeletal:     Right lower leg: Edema (Trace) present.     Left lower leg: Edema (2+ pitting) present.   Physical exam unchanged compared to previous office visit.  Laboratory examination:   Recent Labs    09/21/21 1055 04/09/22 1454 05/14/22 1447 08/23/22 1128  NA 141 146* 140 138  K 4.1 3.5 4.0 3.6  CL 105 106 103 100  CO2 28 16* 30 25  GLUCOSE 97 88 103* 109*  BUN 18 29* 17 23  CREATININE 1.79* 1.77* 1.64* 1.60*  CALCIUM 9.3 9.5 9.6 7.8*  GFRNONAA 39*  --  43* 44*       Latest Ref Rng & Units 08/23/2022   11:28 AM 05/14/2022    2:47 PM 04/09/2022    2:54 PM  CMP  Glucose 70 - 99 mg/dL 109  103  88   BUN 8 - 23 mg/dL '23  17  29   '$ Creatinine 0.61 - 1.24 mg/dL 1.60  1.64  1.77   Sodium 135 - 145 mmol/L 138  140  146   Potassium 3.5 - 5.1 mmol/L 3.6  4.0  3.5   Chloride 98 - 111 mmol/L 100  103  106   CO2 22 - 32 mmol/L '25  30  16   '$ Calcium 8.9 - 10.3 mg/dL 7.8  9.6  9.5   Total Protein 6.5 - 8.1 g/dL  7.4  6.7   Total Bilirubin 0.3 - 1.2 mg/dL  0.9  0.4   Alkaline Phos 38 - 126 U/L  115  126   AST 15 - 41 U/L  18  18   ALT 0 - 44 U/L  13  17       Latest Ref Rng & Units 08/23/2022   11:28 AM 05/14/2022    2:47 PM 09/21/2021   10:55 AM  CBC  WBC 4.0 - 10.5 K/uL 11.2  7.3  6.5   Hemoglobin 13.0 - 17.0 g/dL 14.4  16.0  13.2   Hematocrit 39.0 - 52.0 % 46.0  48.4  41.3   Platelets 150 - 400 K/uL 198  219  179    Lab Results  Component Value Date   CHOL 148 03/19/2019   HDL 32 (L) 03/19/2019   LDLCALC 90 03/19/2019   TRIG 132 03/19/2019   CHOLHDL 4.6 03/19/2019     HEMOGLOBIN A1C Lab Results  Component Value Date   HGBA1C 6.0 (H) 03/18/2019   MPG 125.5 03/18/2019   No results found for: "TSH"   BNP (last 3 results) Recent Labs    08/23/22 1128  BNP  823.2*    ProBNP (last 3 results) Recent Labs    04/09/22 1454  PROBNP 2,421*    SPEP with Reflex 09/21/2021: The SPE pattern appears unremarkable. Evidence of monoclonal protein is not  apparent.  External labs: Hemoglobin 13.500 g/d 07/26/2020 Platelets 235.000 K/ 07/26/2020  Creatinine, Serum 1.790 mg/ 06/01/2020 Potassium 3.800 mm 06/01/2020 ALT (SGPT) 9.000 U/L 06/01/2020 TSH 4.350 08/23/2019  Lab 01/17/2020:  Serum glucose 109 mg, BUN 55, creatinine 2.6, EGFR 29.3 mL, sodium 140, potassium 4.8.  PSA 0.777 ng/ 08/23/2019  Allergies  No Known Allergies   Medications   Current Outpatient Medications:    allopurinol (ZYLOPRIM) 300 MG tablet, Take 300 mg by mouth daily., Disp: , Rfl:    atorvastatin (LIPITOR) 20 MG tablet, TAKE 1 TABLET BY MOUTH ONCE DAILY AT  6PM, Disp: 90 tablet, Rfl: 1   carvedilol (COREG) 25 MG tablet, Take 1.5 tablets (37.5 mg total) by mouth 2 (two) times daily., Disp: 270 tablet, Rfl: 3   dapagliflozin propanediol (FARXIGA) 10 MG TABS tablet, Take 1 tablet (10 mg total) by mouth daily before breakfast., Disp: 30 tablet, Rfl: 1   furosemide (LASIX) 20 MG tablet, Take 1 tablet (20 mg total) by mouth every morning., Disp: 90 tablet, Rfl: 2   glipiZIDE (GLUCOTROL) 5 MG tablet, Take 5 mg by mouth daily before breakfast., Disp: , Rfl:    hydrALAZINE (APRESOLINE) 100 MG tablet, Take 100 mg by mouth 2 (two) times daily. , Disp: , Rfl:    ONETOUCH VERIO test strip, 1 each daily., Disp: , Rfl:    spironolactone (ALDACTONE) 25 MG tablet, TAKE 1 TABLET BY MOUTH ONCE DAILY IN THE MORNING, Disp: 90 tablet, Rfl: 0   Tamsulosin HCl (FLOMAX) 0.4 MG CAPS, Take 0.4 mg by mouth daily., Disp: , Rfl:    valsartan (DIOVAN) 80 MG tablet, Take 1 tablet (80 mg total) by mouth every evening., Disp: 90 tablet, Rfl: 3   warfarin (COUMADIN) 2.5 MG tablet, Take 1 tablet (2.5 mg total) by mouth as directed. 2.5 mg  every Wed, Fri as directed, Disp: 90 tablet, Rfl: 3   warfarin (COUMADIN) 5 MG tablet, Take 1 tablet (5 mg total) by mouth as directed. 5 mg (1 tablet) daily except Wednesday and Friday 2.5 mg., Disp: 90 tablet, Rfl: 0   Radiology:   Myocardial amyloid imaging planar  and SPECT 05/03/2022:   By semi-quantitative assessment scan is consistent with increased heart uptake but less than rib uptake-Grade 1. Heart to contralateral lung ratio is between 1-1.5, indeterminate for amyloid.   Study is equivocal for TTR amyloidosis (visual score of 1/ratio between 1-1.5).  Cardiac Studies:   Coronary Angiography 03/19/2019:  No change from 03/06/2012: LM: Normal LAD: Mid LAD focal 40% stenosis          Ostial D-2 80% stenosis with TIMI III flow. Unchanged compared to 2013 angiography. Patent mid Diag 2 stent without ISR LCx: Patent mid LCX overlapping stents with 10-20% late lumen loss RCA: CTO Mid 100% occlusion with grade 3 left-to-right collaterals.     PCV STRAIN IMAGING ECHOCARDIOGRAM 05/09/2022  Narrative Echocardiogram 05/09/2022: Moderately depressed LV systolic function with visual EF 35-40%. Left ventricle cavity is normal in size. Mild concentric hypertrophy of the left ventricle. Hypokinetic global wall motion. Indeterminate diastolic filling pattern. Calculated EF 34%. Left atrial cavity is mildly dilated at 37.1 ml/m^2. Trileaflet aortic valve.  Trace aortic regurgitation. Mild aortic valve leaflet thickening. Structurally normal mitral valve.  Mild (Grade I) mitral regurgitation. Structurally  normal tricuspid valve.  Moderate tricuspid regurgitation. Mild pulmonary hypertension. RVSP measures 31 mmHg. Compared to 08/2019, EF is slightly worse (previously 40-45%) and pulmonary HTN is mild compared to moderate-severe.   EKG   EKG atrial fibrillation with controlled ventricular response at rate of 98 bpm, normal axis, poor R progression, cannot exclude anterolateral infarct old.  Frequent PVCs (5) In bigeminal pattern.  Compared to 05/13/2022, PVC number has increased from 3.  Assessment      ICD-10-CM   1. Acute on chronic diastolic heart failure (HCC)  I50.33     2. Permanent atrial fibrillation (HCC)  I48.21     3. Essential hypertension   I10     4. CKD stage 4 secondary to hypertension (HCC)  I12.9    N18.4      CHA2DS2-VASc Score is 5.  Yearly risk of stroke: 7.2% (A, HTN, CAD & CHF).  Score of 1=0.6; 2=2.2; 3=3.2; 4=4.8; 5=7.2; 6=9.8; 7=>9.8) -(CHF; HTN; vasc disease DM,  Male = 1; Age <65 =0; 65-74 = 1,  >75 =2; stroke/embolism= 2).     No orders of the defined types were placed in this encounter.  There are no discontinued medications.   Recommendations:   Gregory [name]  is a 77 y.o.  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    Patient presented to the emergency room with worsening leg edema and dyspnea on 08/23/2022 received IV Lasix, BNP was markedly elevated and discharged home with outpatient follow-up.  Patient is in Remote Patient Monitoring and Principal Care Management as patient is high risk for hospitalization and complications from underlying medical conditions.   1. Acute on chronic combined systolic and diastolic heart failure ([place]) Patient is in acute decompensated heart failure.  Upon review, patient has been eating peanut butter along with pretzels as his main snack on a daily basis, suspect high sodium content has led to heart failure.  He also has frequent PVCs, I may consider addition of amiodarone to see whether his heart failure may be related to PVCs however I would like to consider diet modification and weight loss first.  Otherwise he is on guideline directed medical therapy.  His renal function has remained stable.  RPM Data Patient weight has fluctuated. Patient 30-d average and range is below -    Weight            lb         --          264.1 (262.4 - 275.6)   08/27/22 9:37 AM           ...         269.0   lb                      08/26/22 9:29 AM            ...         266.8   lb                      08/25/22 11:17 AM         ..Marland Kitchen         264.6   lb  08/24/22 9:50 AM           ..Marland Kitchen         266.8   lb                      08/22/22 10:09 AM       ..Marland Kitchen         273.4   lb                      08/21/22 9:27 AM         ...         275.6   lb                      08/20/22 9:50 AM         ..Marland Kitchen         273.4   lb                      08/19/22 8:07 AM         ..Marland Kitchen         273.4   Lb     2. Permanent atrial fibrillation [facility]) Patient is in permanent atrial fibrillation, do not suspect this to be the etiology for acute decompensated heart failure.  3. Essential hypertension Blood pressure is elevated, will increase furosemide to 40 mg daily along with potassium supplementation, he is already on spironolactone, valsartan, Farxiga and carvedilol for heart failure and also for hypertension, continue the same.  Suspect excess thought to be the reason for his elevated blood pressure.  4. Frequent PVCs As dictated above, patient is having frequent PVCs, may consider addition of amiodarone if heart failure symptoms do not improve with above management.  5. Type 2 diabetes mellitus with stage 2 chronic kidney disease, without long-term current use of insulin (HCC) Multiple weight loss and from coronary disease standpoint, I will start him on Ozempic.  Administrations This Visit     Semaglutide(0.25 or 0.'5MG'$ /DOS) SOPN 0.25 mg     Admin Date 08/28/2022 Action Given Dose 0.25 mg Route Subcutaneous Administered By Gaye Alken, [place]           6. Coronary artery disease involving native coronary artery of native heart with other form of angina pectoris ([place]) Coronary disease has remained stable, heart failure could be a symptom of his angina however unless conservative therapy fails then we will consider cardiac catheterization artery or consider repeating stress testing.  Patient enrolled in RPM for heart failure.  Will continue with RPM  for now elevated midday great impact with regard to his overall wellbeing and reducing hospitalization.  RPM Enrolled December Data 2023 Average Weight Level 261.84 lbs Lowest Weight Level 258.8 lbs Highest Weight Level 266 lbs   Adrian Prows, MD, [facility] 08/28/2022, 6:46 AM Office: [phone] Fax: [phone] Pager: [phone]

## 2022-08-28 NOTE — Telephone Encounter (Signed)
Patient weight has fluctuated. Patient 30-d average and range is below -   Weight lb -- 264.1 (262.4 - 275.6)  08/27/22 9:37 AM ... 269.0 lb    08/26/22 9:29 AM ..Marland Kitchen 266.8 lb    08/25/22 11:17 AM ..Marland Kitchen 264.6 lb    08/24/22 9:50 AM ..Marland Kitchen 266.8 lb    08/22/22 10:09 AM ..Marland Kitchen 273.4 lb    08/21/22 9:27 AM ... 275.6 lb    08/20/22 9:50 AM ..Marland Kitchen 273.4 lb    08/19/22 8:07 AM ..Marland Kitchen 273.4 Lb

## 2022-08-29 ENCOUNTER — Ambulatory Visit: Payer: Medicare Other | Admitting: Cardiology

## 2022-08-31 NOTE — Telephone Encounter (Signed)
Appointment scheduled.

## 2022-09-10 ENCOUNTER — Telehealth: Payer: Self-pay

## 2022-09-10 ENCOUNTER — Ambulatory Visit: Payer: Medicare Other | Admitting: Cardiology

## 2022-09-10 NOTE — Telephone Encounter (Signed)
Patient weight has trended down since last office visit.  Prior to 08/28/22 Weight lb --  264.1 (262.4 - 275.6)   08/28/22 - 09/10/2022 Weight lb -- 260.5 (257.9 - 266.8)  09/09/22 9:37 AM  257.9 lb    09/08/22 10:12 AM  257.9 lb    09/07/22 9:20 AM  257.9 lb    09/06/22 9:37 AM  257.9 lb    09/05/22 10:03 AM  260.1 lb    09/04/22 9:56 AM  257.9 lb    09/03/22 9:30 AM  260.1 lb    09/02/22 9:43 AM  262.4 lb    09/01/22 10:26 AM  260.1 lb    08/31/22 11:38 AM  262.4 Lb

## 2022-09-11 ENCOUNTER — Telehealth: Payer: Self-pay

## 2022-09-11 NOTE — Telephone Encounter (Signed)
He is going to wait until he sees you next week

## 2022-09-11 NOTE — Telephone Encounter (Signed)
Ozempic patient had a reaction to this, trouble breathing and coughing

## 2022-09-11 NOTE — Telephone Encounter (Signed)
Lvm for patient to call back

## 2022-09-11 NOTE — Telephone Encounter (Signed)
Related to excess food intake. Ask him to cut down on the amount and see how he does, he wont have this reaction. Also he needs to eat slow

## 2022-09-18 ENCOUNTER — Ambulatory Visit: Payer: Medicare Other | Admitting: Cardiology

## 2022-09-18 ENCOUNTER — Telehealth: Payer: Self-pay

## 2022-09-18 ENCOUNTER — Encounter: Payer: Self-pay | Admitting: Cardiology

## 2022-09-18 VITALS — BP 103/61 | HR 60 | Resp 18 | Ht 71.0 in | Wt 261.4 lb

## 2022-09-18 DIAGNOSIS — Z5181 Encounter for therapeutic drug level monitoring: Secondary | ICD-10-CM

## 2022-09-18 DIAGNOSIS — E1122 Type 2 diabetes mellitus with diabetic chronic kidney disease: Secondary | ICD-10-CM

## 2022-09-18 DIAGNOSIS — I5042 Chronic combined systolic (congestive) and diastolic (congestive) heart failure: Secondary | ICD-10-CM

## 2022-09-18 DIAGNOSIS — I10 Essential (primary) hypertension: Secondary | ICD-10-CM

## 2022-09-18 DIAGNOSIS — I4821 Permanent atrial fibrillation: Secondary | ICD-10-CM

## 2022-09-18 DIAGNOSIS — I25118 Atherosclerotic heart disease of native coronary artery with other forms of angina pectoris: Secondary | ICD-10-CM

## 2022-09-18 LAB — POCT INR: POC INR: >8

## 2022-09-18 MED ORDER — DICYCLOMINE HCL 10 MG PO CAPS: 10.0000 mg | ORAL_CAPSULE | Freq: Three times a day (TID) | ORAL | 1 refills | Status: DC | PRN

## 2022-09-18 MED ORDER — SEMAGLUTIDE(0.25 OR 0.5MG/DOS) 2 MG/1.5ML ~~LOC~~ SOPN: 0.2500 mg | PEN_INJECTOR | SUBCUTANEOUS | Status: DC

## 2022-09-18 NOTE — Progress Notes (Signed)
Primary Physician/Referring:  Sonia Side., FNP  Patient ID: Gregory [name], male    DOB: Oct 05, 1945, 77 y.o.   MRN: [id number]  Chief Complaint  Patient presents with   Acute on chronic combined systolic and diastolic heart fail   Follow-up    HPI:    Gregory [name]  is a 77 y.o. 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     Patient presented to the emergency room with worsening leg edema and dyspnea on 08/23/2022, I treated him for acute decompensated heart failure, had long discussion regarding making lifestyle changes and also dietary changes.  He has lost about 15 pounds in weight, leg edema has completely resolved, presents here for a 4-week follow-up.  He is feeling the best he has in quite a while.  Past Medical History:  Diagnosis Date   CAD (coronary artery disease)    COPD (chronic obstructive pulmonary disease) ([place])    Diabetes mellitus    DOE (dyspnea on exertion) 09/23/2012   CXR 09/2012:  Basilar atx.  Ambulatory ox 09/2012:  desat to 86% on RA.  Now normal.   V/Q 2014:  Normal perfusion.    Heart attack ([place])    Hyperlipidemia    Hypertension    Neuromuscular disorder ([place])    SOB (shortness of breath) on exertion    Past Surgical History:  Procedure Laterality Date   BACK SURGERY     CARDIAC CATHETERIZATION     CORONARY STENT PLACEMENT     KNEE ARTHROPLASTY     LEFT HEART CATH AND CORONARY ANGIOGRAPHY N/A 03/19/2019   Procedure: LEFT HEART CATH AND CORONARY ANGIOGRAPHY;  Surgeon: Nigel Mormon, MD;  Location: [place] [facility];  Service: Cardiovascular;  Laterality: N/A;   LEFT HEART CATHETERIZATION WITH CORONARY ANGIOGRAM N/A 03/03/2012   Procedure: LEFT HEART CATHETERIZATION WITH CORONARY ANGIOGRAM;  Surgeon: Laverda Page, MD;  Location: [facility] CATH LAB;  Service: Cardiovascular;  Laterality: N/A;   PERCUTANEOUS CORONARY STENT INTERVENTION (PCI-S)  03/03/2012   Procedure: PERCUTANEOUS CORONARY STENT INTERVENTION (PCI-S);  Surgeon: Laverda Page, MD;  Location: [facility] CATH LAB;  Service: Cardiovascular;;   Social History   Tobacco Use   Smoking status: Former    Packs/day: 1.00    Years: 30.00    Additional pack years: 0.00    Total pack years: 30.00    Types: Cigarettes    Quit date: 07/30/2003    Years since quitting: 19.1   Smokeless tobacco: Never  Substance Use Topics   Alcohol use: Yes    Alcohol/week: 0.0 standard drinks of alcohol    Comment: wine rarely    Marital Status: Married  ROS  Review of Systems  Cardiovascular:  Positive for dyspnea on exertion. Negative for chest pain and leg swelling.   Objective      09/18/2022    1:05 PM 08/28/2022   12:59 PM 08/23/2022   11:18 AM  Vitals with BMI  Height 5\' 11"  5\' 11"  5\' 11"   Weight 261 lbs 6 oz 269 lbs 10 oz 265 lbs  BMI 36.47 [id number]  Systolic [id number]  Diastolic 61 93 82  Pulse 60 64 62    Blood pressure 103/61, pulse 60, resp. rate 18, height 5\' 11"  (1.803 m), weight 261 lb 6.4 oz (  118.6 kg), SpO2 97 %. Body mass index is 36.46 kg/m.   Physical Exam Vitals reviewed.  Constitutional:      Appearance: He is obese.     Comments: He is well-built and moderately obese in no acute distress.  Neck:     Vascular: No carotid bruit or JVD.  Cardiovascular:     Rate and Rhythm: Normal rate. Rhythm irregular.     Pulses: Normal pulses and intact distal pulses.     Heart sounds: No murmur heard.    No gallop. No S3 or S4 sounds.     Comments:   Pulmonary:     Effort: Pulmonary effort is normal.     Breath sounds: Normal breath sounds.  Abdominal:     General: Bowel sounds are normal.     Palpations: Abdomen is soft.     Comments: Obese.  Musculoskeletal:     Right lower leg: No edema.     Left lower leg: No edema.   Physical exam unchanged compared to previous office visit.  Laboratory examination:   Recent Labs    09/21/21 1055 04/09/22 1454 05/14/22 1447 08/23/22 1128  NA 141 146* 140 138  K 4.1 3.5 4.0 3.6  CL 105 106 103 100  CO2 28 16* 30 25  GLUCOSE 97 88 103* 109*  BUN 18 29* 17 23  CREATININE 1.79* 1.77* 1.64* 1.60*  CALCIUM 9.3 9.5 9.6 7.8*  GFRNONAA 39*  --  43* 44*      Latest Ref Rng & Units 08/23/2022   11:28 AM 05/14/2022    2:47 PM 04/09/2022    2:54 PM  CMP  Glucose 70 - 99 mg/dL 109  103  88   BUN 8 - 23 mg/dL 23  17  29    Creatinine 0.61 - 1.24 mg/dL 1.60  1.64  1.77   Sodium 135 - 145 mmol/L 138  140  146   Potassium 3.5 - 5.1 mmol/L 3.6  4.0  3.5   Chloride 98 - 111 mmol/L 100  103  106   CO2 22 - 32 mmol/L 25  30  16    Calcium 8.9 - 10.3 mg/dL 7.8  9.6  9.5   Total Protein 6.5 - 8.1 g/dL  7.4  6.7   Total Bilirubin 0.3 - 1.2 mg/dL  0.9  0.4   Alkaline Phos 38 - 126 U/L  115  126   AST 15 - 41 U/L  18  18   ALT 0 - 44 U/L  13  17       Latest Ref Rng & Units 08/23/2022   11:28 AM 05/14/2022    2:47 PM 09/21/2021   10:55 AM  CBC  WBC 4.0 - 10.5 K/uL 11.2  7.3  6.5   Hemoglobin 13.0 - 17.0 g/dL 14.4  16.0  13.2   Hematocrit 39.0 - 52.0 % 46.0  48.4  41.3   Platelets 150 - 400 K/uL 198  219  179    Lab Results  Component Value Date   CHOL 148 03/19/2019   HDL 32 (L) 03/19/2019   LDLCALC 90 03/19/2019   TRIG 132 03/19/2019   CHOLHDL 4.6 03/19/2019     HEMOGLOBIN A1C Lab Results  Component Value Date   HGBA1C 6.0 (H) 03/18/2019   MPG 125.5 03/18/2019   No results found for: "TSH"   BNP (last 3 results) Recent Labs    08/23/22 1128  BNP 823.2*    ProBNP (last 3 results) Recent  Labs    04/09/22 1454  PROBNP 2,421*   SPEP with Reflex 09/21/2021: The SPE pattern appears unremarkable. Evidence of monoclonal protein is not apparent.  External labs: Hemoglobin 13.500 g/d 07/26/2020 Platelets 235.000 K/ 07/26/2020  Creatinine, Serum 1.790 mg/  06/01/2020 Potassium 3.800 mm 06/01/2020 ALT (SGPT) 9.000 U/L 06/01/2020 TSH 4.350 08/23/2019  Lab 01/17/2020:  Serum glucose 109 mg, BUN 55, creatinine 2.6, EGFR 29.3 mL, sodium 140, potassium 4.8.  PSA 0.777 ng/ 08/23/2019    Radiology:   Myocardial amyloid imaging planar and SPECT 05/03/2022:   By semi-quantitative assessment scan is consistent with increased heart uptake but less than rib uptake-Grade 1. Heart to contralateral lung ratio is between 1-1.5, indeterminate for amyloid.   Study is equivocal for TTR amyloidosis (visual score of 1/ratio between 1-1.5).  Cardiac Studies:   Coronary Angiography 03/19/2019:  No change from 03/06/2012: LM: Normal LAD: Mid LAD focal 40% stenosis          Ostial D-2 80% stenosis with TIMI III flow. Unchanged compared to 2013 angiography. Patent mid Diag 2 stent without ISR LCx: Patent mid LCX overlapping stents with 10-20% late lumen loss RCA: CTO Mid 100% occlusion with grade 3 left-to-right collaterals.     PCV STRAIN IMAGING ECHOCARDIOGRAM 05/09/2022  Narrative Echocardiogram 05/09/2022: Moderately depressed LV systolic function with visual EF 35-40%. Left ventricle cavity is normal in size. Mild concentric hypertrophy of the left ventricle. Hypokinetic global wall motion. Indeterminate diastolic filling pattern. Calculated EF 34%. Left atrial cavity is mildly dilated at 37.1 ml/m^2. Trileaflet aortic valve.  Trace aortic regurgitation. Mild aortic valve leaflet thickening. Structurally normal mitral valve.  Mild (Grade I) mitral regurgitation. Structurally normal tricuspid valve.  Moderate tricuspid regurgitation. Mild pulmonary hypertension. RVSP measures 31 mmHg. Compared to 08/2019, EF is slightly worse (previously 40-45%) and pulmonary HTN is mild compared to moderate-severe.   EKG   EKG atrial fibrillation with controlled ventricular response at rate of 98 bpm, normal axis, poor R progression, cannot exclude anterolateral infarct  old.  Frequent PVCs (5) In bigeminal pattern.  Compared to 05/13/2022, PVC number has increased from 3.   Allergies   Allergies  Allergen Reactions   Farxiga [Dapagliflozin] Other (See Comments)    Abdominal cramps     Medications   Current Outpatient Medications:    allopurinol (ZYLOPRIM) 300 MG tablet, Take 300 mg by mouth daily., Disp: , Rfl:    atorvastatin (LIPITOR) 20 MG tablet, TAKE 1 TABLET BY MOUTH ONCE DAILY AT  6PM, Disp: 90 tablet, Rfl: 1   carvedilol (COREG) 25 MG tablet, Take 1.5 tablets (37.5 mg total) by mouth 2 (two) times daily., Disp: 270 tablet, Rfl: 3   dicyclomine (BENTYL) 10 MG capsule, Take 1 capsule (10 mg total) by mouth 3 (three) times daily as needed for spasms (Abdominal discomfort)., Disp: 30 capsule, Rfl: 1   furosemide (LASIX) 40 MG tablet, Take 1 tablet (40 mg total) by mouth every morning., Disp: 90 tablet, Rfl: 1   glipiZIDE (GLUCOTROL) 5 MG tablet, Take 5 mg by mouth daily before breakfast., Disp: , Rfl:    hydrALAZINE (APRESOLINE) 100 MG tablet, Take 100 mg by mouth 2 (two) times daily. , Disp: , Rfl:    ONETOUCH VERIO test strip, 1 each daily., Disp: , Rfl:    spironolactone (ALDACTONE) 25 MG tablet, TAKE 1 TABLET BY MOUTH ONCE DAILY IN THE MORNING, Disp: 90 tablet, Rfl: 0   Tamsulosin HCl (FLOMAX) 0.4 MG CAPS, Take 0.4 mg by mouth daily., Disp: ,  Rfl:    valsartan (DIOVAN) 80 MG tablet, Take 1 tablet (80 mg total) by mouth every evening., Disp: 90 tablet, Rfl: 3   warfarin (COUMADIN) 2.5 MG tablet, Take 1 tablet (2.5 mg total) by mouth as directed. 2.5 mg  every Wed, Fri as directed, Disp: 90 tablet, Rfl: 3   warfarin (COUMADIN) 5 MG tablet, Take 1 tablet (5 mg total) by mouth as directed. 5 mg (1 tablet) daily except Wednesday and Friday 2.5 mg., Disp: 90 tablet, Rfl: 0  Current Facility-Administered Medications:    Semaglutide(0.25 or 0.5MG /DOS) SOPN 0.25 mg, 0.25 mg, Subcutaneous, Weekly, Adrian Prows, MD   Assessment      ICD-10-CM   1.  Chronic combined systolic and diastolic heart failure (HCC)  123456 Basic metabolic panel    Pro b natriuretic peptide (BNP)    2. Permanent atrial fibrillation (HCC)  I48.21 POCT INR    3. Coronary artery disease involving native coronary artery of native heart with other form of angina pectoris (HCC)  I25.118 Semaglutide(0.25 or 0.5MG /DOS) SOPN 0.25 mg    4. Essential hypertension  I10     5. Type 2 diabetes mellitus with stage 3a chronic kidney disease, without long-term current use of insulin (HCC)  E11.22 Semaglutide(0.25 or 0.5MG /DOS) SOPN 0.25 mg   N18.31     6. Monitoring for long-term anticoagulant use  Z51.81 POCT INR   Z79.01      CHA2DS2-VASc Score is 5.  Yearly risk of stroke: 7.2% (A, HTN, CAD & CHF).  Score of 1=0.6; 2=2.2; 3=3.2; 4=4.8; 5=7.2; 6=9.8; 7=>9.8) -(CHF; HTN; vasc disease DM,  Male = 1; Age <65 =0; 65-74 = 1,  >75 =2; stroke/embolism= 2).     Meds ordered this encounter  Medications   dicyclomine (BENTYL) 10 MG capsule    Sig: Take 1 capsule (10 mg total) by mouth 3 (three) times daily as needed for spasms (Abdominal discomfort).    Dispense:  30 capsule    Refill:  1   Semaglutide(0.25 or 0.5MG /DOS) SOPN 0.25 mg   Medications Discontinued During This Encounter  Medication Reason   dapagliflozin propanediol (FARXIGA) 10 MG TABS tablet Side effect (s)   potassium chloride (KLOR-CON) 10 MEQ tablet Patient Preference     Recommendations:   Gregory [name]  is a 77 y.o.  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  Patient is in Remote Patient Monitoring and Principal Care Management as patient is high risk for hospitalization and complications from underlying medical conditions.   Patient presented to the emergency room  with worsening leg edema and dyspnea on 08/23/2022 received IV Lasix, BNP was markedly elevated and discharged home with outpatient follow-up.  On his last office visit I had uptitrated his diuretics and also started him on Ozempic.  He now presents for follow-up starts that he is feeling the best he has in quite a while.  He has had complete resolution of leg edema and dyspnea has markedly improved.  RPM Data Patient weight has fluctuated. Patient 30-d average and range is below -      1. Chronic combined systolic and diastolic heart failure ([place]) On his last office visit had started him on Ozempic, he has lost about 15 pounds in weight, had marked decreased appetite, also had abdominal discomfort and hence discontinued this.  After discussions regarding cardiovascular reduction and also improvement in overall health and also heart failure, patient is willing to try this again, dicyclomine for abdominal cramps prescribed.  Will keep his dose at 0.25 mg only and not escalated to 0.5.  Since weight loss, watching his diet, heart failure symptoms is completely resolved.  No further leg edema, lungs are clear, no JVD.  I will repeat BMP and also NT proBNP in 2 weeks.  Office visit in 4 weeks. - Basic metabolic panel - Pro b natriuretic peptide (BNP)  2. Permanent atrial fibrillation ([place]) He is in permanent atrial fibrillation, asymptomatic.  Description   09/18/2022    INR today was >8. Patient goal level is (2.0-3.0)  Prior dose was 5 mg Monday, Wednesday, Friday, and 2.5 mg all the other days.  patient will now hold for 5 days and start on 2.5 mg daily.  Pt will recheck INR 1 weeks on 09/25/2022.  Lab Results      Component                Value               Date                      INR                      >8                  09/18/2022                INR                      2.9                 08/28/2022                INR                      6.4 (HH)            08/23/2022             Chronic combined systolic and diastolic heart failure (HCC)  (primary encounter diagnosis) Plan: Basic metabolic panel, Pro b natriuretic        peptide (BNP)  Permanent atrial fibrillation (HCC) Plan: POCT INR  Coronary artery disease involving native coronary artery of native heart with other form of angina pectoris (HCC) Plan: Semaglutide(0.25 or 0.5MG /DOS) SOPN 0.25 mg  Essential hypertension  Type 2 diabetes mellitus with stage 3a chronic kidney disease, without long-term current use of insulin (HCC) Plan: Semaglutide(0.25 or 0.5MG /DOS) SOPN 0.25 mg  Monitoring for long-term anticoagulant use Plan: POCT INR       3. Coronary artery disease involving native coronary artery of native heart with other form of angina pectoris ([place]) No recurrence of angina pectoris. - Semaglutide(0.25 or 0.5MG /DOS) SOPN 0.25 mg  4. Essential hypertension Blood pressure is soft due to weight loss, I suspect with continued weight loss I may have to discontinue some of his medications.  5. Type 2 diabetes mellitus with stage 3a chronic kidney disease, without long-term current use of insulin ([place]) Renal function has remained stable, I will check BMP in 2 weeks.   6. Monitoring for long-term anticoagulant use Patient with permanent atrial fibrillation, presently on Coumadin, INR is supratherapeutic.  Will continue to monitor this in our  Coumadin clinic as per protocol.  Other orders - dicyclomine (BENTYL) 10 MG capsule; Take 1 capsule (10 mg total) by mouth 3 (three) times daily as needed for spasms (Abdominal discomfort).  Dispense: 30 capsule; Refill: 1   Adrian Prows, MD, [facility] 09/18/2022, 2:00 PM Office: [phone] Fax: [phone] Pager: [phone]

## 2022-09-18 NOTE — Telephone Encounter (Signed)
Patient weight has decreased over this past month but overall the average has been the same for the past two months.   Weight lb -- 262.8 (253.5 - 275.6)  09/17/22 10:20 AM  255.7 lb    09/14/22 11:02 AM  255.7 lb    09/13/22 10:26 AM  255.7 lb    09/12/22 9:51 AM  255.7 lb    09/11/22 2:36 PM  253.5 lb    09/10/22 9:39 AM  257.9 lb    09/09/22 9:37 AM  257.9 Lb

## 2022-09-23 ENCOUNTER — Ambulatory Visit: Payer: Medicare Other | Admitting: Hematology and Oncology

## 2022-09-23 ENCOUNTER — Other Ambulatory Visit: Payer: Medicare Other

## 2022-09-24 ENCOUNTER — Ambulatory Visit: Payer: Medicare Other | Admitting: Cardiology

## 2022-09-25 ENCOUNTER — Ambulatory Visit: Payer: Medicare Other | Admitting: Cardiology

## 2022-09-25 DIAGNOSIS — I5042 Chronic combined systolic (congestive) and diastolic (congestive) heart failure: Secondary | ICD-10-CM

## 2022-09-25 DIAGNOSIS — I4821 Permanent atrial fibrillation: Secondary | ICD-10-CM

## 2022-09-25 DIAGNOSIS — I25118 Atherosclerotic heart disease of native coronary artery with other forms of angina pectoris: Secondary | ICD-10-CM

## 2022-09-25 LAB — POCT INR: INR: 1.9 — AB (ref 2.0–3.0)

## 2022-09-25 NOTE — Progress Notes (Signed)
Description   09/25/2022    INR today was 1.8. Patient goal level is (2.0-3.0)  Prior dose was 2.5 mg daily.  Patient will now start back on 5 mg Wednesday, and 2.5 mg all the other days.   Pt will recheck INR 10 days on 04/15.    Lab Results      Component                Value               Date                      INR                      1.9 (A)             09/25/2022                INR                      >8                  09/18/2022                INR                      2.9                 08/28/2022             Chronic combined systolic and diastolic heart failure  (primary encounter diagnosis) Plan: POCT INR  Coronary artery disease involving native coronary artery of native heart with other form of angina pectoris  Permanent atrial fibrillation

## 2022-10-03 LAB — BASIC METABOLIC PANEL
BUN/Creatinine Ratio: 12 (ref 10–24)
BUN: 24 mg/dL (ref 8–27)
CO2: 23 mmol/L (ref 20–29)
Calcium: 8.6 mg/dL (ref 8.6–10.2)
Chloride: 100 mmol/L (ref 96–106)
Creatinine, Ser: 1.93 mg/dL — ABNORMAL HIGH (ref 0.76–1.27)
Glucose: 86 mg/dL (ref 70–99)
Potassium: 4 mmol/L (ref 3.5–5.2)
Sodium: 138 mmol/L (ref 134–144)
eGFR: 35 mL/min/{1.73_m2} — ABNORMAL LOW (ref >59)

## 2022-10-03 LAB — PRO B NATRIURETIC PEPTIDE: NT-Pro BNP: 4318 pg/mL — ABNORMAL HIGH (ref 0–486)

## 2022-10-07 NOTE — Progress Notes (Signed)
Patient lost significant amount of weight over the past 3 to 4 days as he took Ozempic but got extremely ill.  Said that he could not swallow, was essentially bedridden for 3 days.  He has lost about 10 to 15 pounds in weight.  Will continue to observe for now, suspect improvement in heart failure symptoms as I discussed over the telephone is related to significant amount of diuresis as well.  Advised him to continue to maintain his weight loss.  Advised him to discontinue Ozempic for now.

## 2022-10-07 NOTE — Progress Notes (Deleted)
Primary Physician/Referring:  [name], FNP  Patient ID: Gregory [name], male    DOB: [birth year], 77 y.o.   MRN: [id number]  No chief complaint on file.   HPI:    Gregory [name]  is a 77 y.o. 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     Patient presented to the emergency room with worsening leg edema and dyspnea on 08/23/2022, I treated him for acute decompensated heart failure, had long discussion regarding making lifestyle changes and also dietary changes.  He has lost about 15 pounds in weight, leg edema has completely resolved, presents here for a 4-week follow-up.  He is feeling the best he has in quite a while.  Past Medical History:  Diagnosis Date   CAD (coronary artery disease)    COPD (chronic obstructive pulmonary disease) (HCC)    Diabetes mellitus    DOE (dyspnea on exertion) 09/23/2012   CXR 09/2012:  Basilar atx.  Ambulatory ox 09/2012:  desat to 86% on RA.  Now normal.   V/Q 2014:  Normal perfusion.    Heart attack (HCC)    Hyperlipidemia    Hypertension    Neuromuscular disorder (HCC)    SOB (shortness of breath) on exertion    Past Surgical History:  Procedure Laterality Date   BACK SURGERY     CARDIAC CATHETERIZATION     CORONARY STENT PLACEMENT     KNEE ARTHROPLASTY     LEFT HEART CATH AND CORONARY ANGIOGRAPHY N/A 03/19/2019   Procedure: LEFT HEART CATH AND CORONARY ANGIOGRAPHY;  Surgeon: Elder Negus, MD;  Location: [facility];  Service: Cardiovascular;  Laterality: N/A;   LEFT HEART CATHETERIZATION WITH CORONARY ANGIOGRAM N/A 03/03/2012   Procedure: LEFT HEART CATHETERIZATION WITH CORONARY ANGIOGRAM;  Surgeon: Pamella Pert, MD;  Location: [facility] CATH LAB;  Service: Cardiovascular;  Laterality: N/A;    PERCUTANEOUS CORONARY STENT INTERVENTION (PCI-S)  03/03/2012   Procedure: PERCUTANEOUS CORONARY STENT INTERVENTION (PCI-S);  Surgeon: Pamella Pert, MD;  Location: [facility] CATH LAB;  Service: Cardiovascular;;   Social History   Tobacco Use   Smoking status: Former    Packs/day: 1.00    Years: 30.00    Additional pack years: 0.00    Total pack years: 30.00    Types: Cigarettes    Quit date: 07/30/2003    Years since quitting: 19.2   Smokeless tobacco: Never  Substance Use Topics   Alcohol use: Yes    Alcohol/week: 0.0 standard drinks of alcohol    Comment: wine rarely    Marital Status: Married  ROS  Review of Systems  Cardiovascular:  Positive for dyspnea on exertion. Negative for chest pain and leg swelling.   Objective      09/18/2022    1:05 PM 08/28/2022   12:59 PM 08/23/2022   11:18 AM  Vitals with BMI  Height     Weight 261 lbs 6 oz 269 lbs 10 oz 265 lbs  BMI 36.47 37.62 36.98  Systolic 103 143 102  Diastolic 61 93 82  Pulse 60 64 62    There were no vitals taken for this visit. There is no height or weight on file to calculate BMI.   Physical Exam Vitals reviewed.  Constitutional:      Appearance: He  is obese.     Comments: He is well-built and moderately obese in no acute distress.  Neck:     Vascular: No carotid bruit or JVD.  Cardiovascular:     Rate and Rhythm: Normal rate. Rhythm irregular.     Pulses: Normal pulses and intact distal pulses.     Heart sounds: No murmur heard.    No gallop. No S3 or S4 sounds.     Comments:   Pulmonary:     Effort: Pulmonary effort is normal.     Breath sounds: Normal breath sounds.  Abdominal:     General: Bowel sounds are normal.     Palpations: Abdomen is soft.     Comments: Obese.  Musculoskeletal:     Right lower leg: No edema.     Left lower leg: No edema.   Physical exam unchanged compared to previous office visit.  Laboratory examination:   Recent Labs    05/14/22 1447  08/23/22 1128 10/02/22 1122  NA 140 138 138  K 4.0 3.6 4.0  CL 103 100 100  CO2 GLUCOSE 103* 109* 86  BUN CREATININE 1.64* 1.60* 1.93*  CALCIUM 9.6 7.8* 8.6  GFRNONAA 43* 44*  --       Latest Ref Rng & Units 10/02/2022   11:22 AM 08/23/2022   11:28 AM 05/14/2022    2:47 PM  CMP  Glucose 70 - 99 mg/dL 86  469  629   BUN 8 - 27 mg/dL Creatinine 0.76 - 1.27 mg/dL 5.28  4.13  2.44   Sodium 134 - 144 mmol/L 138  138  140   Potassium 3.5 - 5.2 mmol/L 4.0  3.6  4.0   Chloride 96 - 106 mmol/L 100  100  103   CO2 20 - 29 mmol/L Calcium 8.6 - 10.2 mg/dL 8.6  7.8  9.6   Total Protein 6.5 - 8.1 g/dL   7.4   Total Bilirubin 0.3 - 1.2 mg/dL   0.9   Alkaline Phos 38 - 126 U/L   115   AST 15 - 41 U/L   18   ALT 0 - 44 U/L   13       Latest Ref Rng & Units 08/23/2022   11:28 AM 05/14/2022    2:47 PM 09/21/2021   10:55 AM  CBC  WBC 4.0 - 10.5 K/uL 11.2  7.3  6.5   Hemoglobin 13.0 - 17.0 g/dL 01.0  27.2  53.6   Hematocrit 39.0 - 52.0 % 46.0  48.4  41.3   Platelets 150 - 400 K/uL 198  219  179    Lab Results  Component Value Date   CHOL 148 03/19/2019   HDL 32 (L) 03/19/2019   LDLCALC 90 03/19/2019   TRIG 132 03/19/2019   CHOLHDL 4.6 03/19/2019     HEMOGLOBIN A1C Lab Results  Component Value Date   HGBA1C 6.0 (H) 03/18/2019   MPG 125.5 03/18/2019   No results found for: "TSH"   BNP (last 3 results) Recent Labs    08/23/22 1128  BNP 823.2*    ProBNP (last 3 results) Recent Labs    04/09/22 1454 10/02/22 1122  PROBNP 2,421* 4,318*   SPEP with Reflex 09/21/2021: The SPE pattern appears unremarkable. Evidence of monoclonal protein is not apparent.  External labs: Hemoglobin 13.500 g/d 07/26/2020 Platelets 235.000 K/ 07/26/2020  Creatinine, Serum 1.790 mg/ 06/01/2020 Potassium 3.800 mm 06/01/2020 ALT (SGPT) 9.000 U/L 06/01/2020 TSH 4.350 08/23/2019  Lab 01/17/2020:  Serum glucose 109 mg, BUN 55, creatinine 2.6, EGFR 29.3  mL, sodium 140, potassium 4.8.  PSA 0.777 ng/ 08/23/2019    Radiology:   Myocardial amyloid imaging planar and SPECT 05/03/2022:   By semi-quantitative assessment scan is consistent with increased heart uptake but less than rib uptake-Grade 1. Heart to contralateral lung ratio is between 1-1.5, indeterminate for amyloid.   Study is equivocal for TTR amyloidosis (visual score of 1/ratio between 1-1.5).  Cardiac Studies:   Coronary Angiography 03/19/2019:  No change from 03/06/2012: LM: Normal LAD: Mid LAD focal 40% stenosis          Ostial D-2 80% stenosis with TIMI III flow. Unchanged compared to 2013 angiography. Patent mid Diag 2 stent without ISR LCx: Patent mid LCX overlapping stents with 10-20% late lumen loss RCA: CTO Mid 100% occlusion with grade 3 left-to-right collaterals.     PCV STRAIN IMAGING ECHOCARDIOGRAM 05/09/2022  Narrative Echocardiogram 05/09/2022: Moderately depressed LV systolic function with visual EF 35-40%. Left ventricle cavity is normal in size. Mild concentric hypertrophy of the left ventricle. Hypokinetic global wall motion. Indeterminate diastolic filling pattern. Calculated EF 34%. Left atrial cavity is mildly dilated at 37.1 ml/m^2. Trileaflet aortic valve.  Trace aortic regurgitation. Mild aortic valve leaflet thickening. Structurally normal mitral valve.  Mild (Grade I) mitral regurgitation. Structurally normal tricuspid valve.  Moderate tricuspid regurgitation. Mild pulmonary hypertension. RVSP measures 31 mmHg. Compared to 08/2019, EF is slightly worse (previously 40-45%) and pulmonary HTN is mild compared to moderate-severe.   EKG   EKG atrial fibrillation with controlled ventricular response at rate of 98 bpm, normal axis, poor R progression, cannot exclude anterolateral infarct old.  Frequent PVCs (5) In bigeminal pattern.  Compared to 05/13/2022, PVC number has increased from 3.   Allergies   Allergies  Allergen Reactions   Farxiga  [Dapagliflozin] Other (See Comments)    Abdominal cramps     Medications   Current Outpatient Medications:    allopurinol (ZYLOPRIM) 300 MG tablet, Take 300 mg by mouth daily., Disp: , Rfl:    atorvastatin (LIPITOR) 20 MG tablet, TAKE 1 TABLET BY MOUTH ONCE DAILY AT  6PM, Disp: 90 tablet, Rfl: 1   carvedilol (COREG) 25 MG tablet, Take 1.5 tablets (37.5 mg total) by mouth 2 (two) times daily., Disp: 270 tablet, Rfl: 3   dicyclomine (BENTYL) 10 MG capsule, Take 1 capsule (10 mg total) by mouth 3 (three) times daily as needed for spasms (Abdominal discomfort)., Disp: 30 capsule, Rfl: 1   furosemide (LASIX) 40 MG tablet, Take 1 tablet (40 mg total) by mouth every morning., Disp: 90 tablet, Rfl: 1   glipiZIDE (GLUCOTROL) 5 MG tablet, Take 5 mg by mouth daily before breakfast., Disp: , Rfl:    hydrALAZINE (APRESOLINE) 100 MG tablet, Take 100 mg by mouth 2 (two) times daily. , Disp: , Rfl:    ONETOUCH VERIO test strip, 1 each daily., Disp: , Rfl:    spironolactone (ALDACTONE) 25 MG tablet, TAKE 1 TABLET BY MOUTH ONCE DAILY IN THE MORNING, Disp: 90 tablet, Rfl: 0   Tamsulosin HCl (FLOMAX) 0.4 MG CAPS, Take 0.4 mg by mouth daily., Disp: , Rfl:    valsartan (DIOVAN) 80 MG tablet, Take 1 tablet (80 mg total) by mouth every evening., Disp: 90 tablet, Rfl: 3   warfarin (COUMADIN) 2.5 MG tablet, Take 1 tablet (2.5 mg total) by  mouth as directed. 2.5 mg  every Wed, Fri as directed, Disp: 90 tablet, Rfl: 3   warfarin (COUMADIN) 5 MG tablet, Take 1 tablet (5 mg total) by mouth as directed. 5 mg (1 tablet) daily except Wednesday and Friday 2.5 mg., Disp: 90 tablet, Rfl: 0  Current Facility-Administered Medications:    Semaglutide(0.25 or 0.5MG /DOS) SOPN 0.25 mg, 0.25 mg, Subcutaneous, Weekly, Yates Decamp, MD   Assessment      ICD-10-CM   1. Acute on chronic combined systolic and diastolic heart failure  I50.43     2. Permanent atrial fibrillation  I48.21     3. Coronary artery disease involving native  coronary artery of native heart with other form of angina pectoris  I25.118     4. Type 2 diabetes mellitus with stage 3a chronic kidney disease, without long-term current use of insulin  E11.22    N18.31      CHA2DS2-VASc Score is 5.  Yearly risk of stroke: 7.2% (A, HTN, CAD & CHF).  Score of 1=0.6; 2=2.2; 3=3.2; 4=4.8; 5=7.2; 6=9.8; 7=>9.8) -(CHF; HTN; vasc disease DM,  Male = 1; Age <65 =0; 65-74 = 1,  >75 =2; stroke/embolism= 2).     No orders of the defined types were placed in this encounter.  There are no discontinued medications.    Recommendations:   Gilliam Sullivant  is a 77 y.o.  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  Patient is in Remote Patient Monitoring and Principal Care Management as patient is high risk for hospitalization and complications from underlying medical conditions.   Patient presented to the emergency room with worsening leg edema and dyspnea on 08/23/2022 received IV Lasix, BNP was markedly elevated and discharged home with outpatient follow-up.  On his last office visit I had uptitrated his diuretics and also started him on Ozempic.  He now presents for follow-up starts that he is feeling the best he has in quite a while.  He has had complete resolution of leg edema and dyspnea has markedly improved.  RPM Data Patient weight has fluctuated. Patient 30-d average and range is below -      1. Chronic combined systolic and diastolic heart failure (HCC) On his last office visit had started him on Ozempic, he has lost about 15 pounds in weight, had marked decreased appetite, also had abdominal discomfort and hence discontinued this.  After discussions regarding cardiovascular reduction and also improvement in overall health  and also heart failure, patient is willing to try this again, dicyclomine for abdominal cramps prescribed.  Will keep his dose at 0.25 mg only and not escalated to 0.5.  Since weight loss, watching his diet, heart failure symptoms is completely resolved.  No further leg edema, lungs are clear, no JVD.  I will repeat BMP and also NT proBNP in 2 weeks.  Office visit in 4 weeks. - Basic metabolic panel - Pro b natriuretic peptide (BNP)  2. Permanent atrial fibrillation (HCC) He is in permanent atrial fibrillation, asymptomatic.    3. Coronary artery disease involving native coronary artery of native heart with other form of angina pectoris (HCC) No recurrence of angina pectoris. - Semaglutide(0.25 or 0.5MG /DOS) SOPN 0.25 mg  4. Essential hypertension Blood pressure is soft due to weight loss, I suspect with continued weight loss I may have  to discontinue some of his medications.  5. Type 2 diabetes mellitus with stage 3a chronic kidney disease, without long-term current use of insulin (HCC) Renal function has remained stable, I will check BMP in 2 weeks.   6. Monitoring for long-term anticoagulant use Patient with permanent atrial fibrillation, presently on Coumadin, INR is supratherapeutic.  Will continue to monitor this in our Coumadin clinic as per protocol.  Other orders - dicyclomine (BENTYL) 10 MG capsule; Take 1 capsule (10 mg total) by mouth 3 (three) times daily as needed for spasms (Abdominal discomfort).  Dispense: 30 capsule; Refill: 1   Yates Decamp, MD, [facility] 10/07/2022, 9:01 AM Office: [phone] Fax: [phone] Pager: [phone]

## 2022-10-16 ENCOUNTER — Ambulatory Visit: Payer: Medicare Other | Admitting: Cardiology

## 2022-10-16 ENCOUNTER — Telehealth: Payer: Self-pay

## 2022-10-16 ENCOUNTER — Encounter: Payer: Self-pay | Admitting: Cardiology

## 2022-10-16 VITALS — BP 110/70 | HR 60 | Resp 16 | Ht 71.0 in | Wt 249.0 lb

## 2022-10-16 DIAGNOSIS — I5042 Chronic combined systolic (congestive) and diastolic (congestive) heart failure: Secondary | ICD-10-CM

## 2022-10-16 DIAGNOSIS — I25118 Atherosclerotic heart disease of native coronary artery with other forms of angina pectoris: Secondary | ICD-10-CM

## 2022-10-16 DIAGNOSIS — I4821 Permanent atrial fibrillation: Secondary | ICD-10-CM

## 2022-10-16 LAB — POCT INR
INR: 5 — AB (ref 2.0–3.0)
INR: 5 — AB (ref 2–3)

## 2022-10-16 MED ORDER — WARFARIN SODIUM 1 MG PO TABS: 1.0000 mg | ORAL_TABLET | ORAL | 3 refills | Status: DC

## 2022-10-16 NOTE — Progress Notes (Signed)
Primary Physician/Referring:  [name], FNP  Patient ID: Gregory [name], male    DOB: [birth year], 77 y.o.   MRN: [id number]  Chief Complaint  Patient presents with   Congestive Heart Failure   Follow-up    4 week   HPI:    Gregory [name]  is a 77 y.o. 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   His last hospitalization was from 08/23/2022 for acute decompensated heart failure.  He presents for a 6-week visit, states that he is feeling the best he has in quite a while.  He has been continuously losing weight.  He has been careful with his diet.  With Ozempic he lost significant amount of weight however could not tolerate Ozempic due to severe nausea or vomiting.  Presently except for mild chronic dyspnea, he has not had any chest pain, dizziness or syncope.  Past Medical History:  Diagnosis Date   CAD (coronary artery disease)    COPD (chronic obstructive pulmonary disease)    Diabetes mellitus    DOE (dyspnea on exertion) 09/23/2012   CXR 09/2012:  Basilar atx.  Ambulatory ox 09/2012:  desat to 86% on RA.  Now normal.   V/Q 2014:  Normal perfusion.    Heart attack    Hyperlipidemia    Hypertension    Neuromuscular disorder    SOB (shortness of breath) on exertion    Past Surgical History:  Procedure Laterality Date   BACK SURGERY     CARDIAC CATHETERIZATION     CORONARY STENT PLACEMENT     KNEE ARTHROPLASTY     LEFT HEART CATH AND CORONARY ANGIOGRAPHY N/A 03/19/2019   Procedure: LEFT HEART CATH AND CORONARY ANGIOGRAPHY;  Surgeon: Elder Negus, MD;  Location: [facility] INVASIVE [facility];  Service: Cardiovascular;  Laterality: N/A;   LEFT HEART CATHETERIZATION WITH CORONARY ANGIOGRAM N/A 03/03/2012   Procedure: LEFT HEART CATHETERIZATION WITH  CORONARY ANGIOGRAM;  Surgeon: Pamella Pert, MD;  Location: [facility] CATH LAB;  Service: Cardiovascular;  Laterality: N/A;   PERCUTANEOUS CORONARY STENT INTERVENTION (PCI-S)  03/03/2012   Procedure: PERCUTANEOUS CORONARY STENT INTERVENTION (PCI-S);  Surgeon: Pamella Pert, MD;  Location: [facility] CATH LAB;  Service: Cardiovascular;;   Social History   Tobacco Use   Smoking status: Former    Packs/day: 1.00    Years: 30.00    Additional pack years: 0.00    Total pack years: 30.00    Types: Cigarettes    Quit date: 07/30/2003    Years since quitting: 19.2   Smokeless tobacco: Never  Substance Use Topics   Alcohol use: Yes    Alcohol/week: 0.0 standard drinks of alcohol    Comment: wine rarely    Marital Status: Married  ROS  Review of Systems  Cardiovascular:  Positive for dyspnea on exertion. Negative for chest pain and leg swelling.   Objective      10/16/2022   11:04 AM 09/18/2022    1:05 PM 08/28/2022   12:59 PM  Vitals with BMI  Height     Weight 249 lbs 261 lbs 6 oz 269 lbs 10 oz  BMI 34.74 36.47 37.62  Systolic 110 103 409  Diastolic 70 61 93  Pulse 60 60 64    Blood pressure 110/70, pulse 60, resp. rate  16, height 5\' 11"  (1.803 m), weight 249 lb (112.9 kg). Body mass index is 34.73 kg/m.   Physical Exam Vitals reviewed.  Constitutional:      Appearance: He is obese.     Comments: He is well-built and moderately obese in no acute distress.  Neck:     Vascular: No carotid bruit or JVD.  Cardiovascular:     Rate and Rhythm: Normal rate. Rhythm irregular.     Pulses: Normal pulses and intact distal pulses.     Heart sounds: No murmur heard.    No gallop. No S3 or S4 sounds.     Comments:   Pulmonary:     Effort: Pulmonary effort is normal.     Breath sounds: Normal breath sounds.  Abdominal:     General: Bowel sounds are normal.     Palpations: Abdomen is soft.     Comments: Obese.  Musculoskeletal:     Right lower leg: No edema.     Left  lower leg: No edema.    Laboratory examination:   Recent Labs    05/14/22 1447 08/23/22 1128 10/02/22 1122  NA 140 138 138  K 4.0 3.6 4.0  CL 103 100 100  CO2 GLUCOSE 103* 109* 86  BUN CREATININE 1.64* 1.60* 1.93*  CALCIUM 9.6 7.8* 8.6  GFRNONAA 43* 44*  --       Latest Ref Rng & Units 10/02/2022   11:22 AM 08/23/2022   11:28 AM 05/14/2022    2:47 PM  CMP  Glucose 70 - 99 mg/dL 86  161  096   BUN 8 - 27 mg/dL Creatinine 0.76 - 1.27 mg/dL 0.45  4.09  8.11   Sodium 134 - 144 mmol/L 138  138  140   Potassium 3.5 - 5.2 mmol/L 4.0  3.6  4.0   Chloride 96 - 106 mmol/L 100  100  103   CO2 20 - 29 mmol/L Calcium 8.6 - 10.2 mg/dL 8.6  7.8  9.6   Total Protein 6.5 - 8.1 g/dL   7.4   Total Bilirubin 0.3 - 1.2 mg/dL   0.9   Alkaline Phos 38 - 126 U/L   115   AST 15 - 41 U/L   18   ALT 0 - 44 U/L   13       Latest Ref Rng & Units 08/23/2022   11:28 AM 05/14/2022    2:47 PM 09/21/2021   10:55 AM  CBC  WBC 4.0 - 10.5 K/uL 11.2  7.3  6.5   Hemoglobin 13.0 - 17.0 g/dL 91.4  78.2  95.6   Hematocrit 39.0 - 52.0 % 46.0  48.4  41.3   Platelets 150 - 400 K/uL 198  219  179    Lab Results  Component Value Date   CHOL 148 03/19/2019   HDL 32 (L) 03/19/2019   LDLCALC 90 03/19/2019   TRIG 132 03/19/19 03/18/2019   MPG 125.5 03/18/2019   No results found for: "TSH"   BNP (last 3 results) Recent Labs    08/23/22 1128  BNP 823.2*    ProBNP (last 3 results) Recent Labs    04/09/22 1454 10/02/22 1122  PROBNP 2,421* 4,318*   SPEP with Reflex 09/21/2021:  The SPE pattern appears unremarkable. Evidence of monoclonal protein is not apparent.  External labs:  Hemoglobin 13.500 g/d 07/26/2020 Platelets 235.000 K/ 07/26/2020  Creatinine, Serum 1.790 mg/ 06/01/2020 Potassium 3.800 mm 06/01/2020 ALT (SGPT) 9.000 U/L 06/01/2020  TSH 4.350  08/23/2019  Lab 01/17/2020:  Serum glucose 109 mg, BUN 55, creatinine 2.6, EGFR 29.3 mL, sodium 140, potassium 4.8.  PSA 0.777 ng/ 08/23/2019    Radiology:   Myocardial amyloid imaging planar and SPECT 05/03/2022:   By semi-quantitative assessment scan is consistent with increased heart uptake but less than rib uptake-Grade 1. Heart to contralateral lung ratio is between 1-1.5, indeterminate for amyloid.   Study is equivocal for TTR amyloidosis (visual score of 1/ratio between 1-1.5).  Cardiac Studies:   Coronary Angiography 03/19/2019:  No change from 03/06/2012: LM: Normal LAD: Mid LAD focal 40% stenosis          Ostial D-2 80% stenosis with TIMI III flow. Unchanged compared to 2013 angiography. Patent mid Diag 2 stent without ISR LCx: Patent mid LCX overlapping stents with 10-20% late lumen loss RCA: CTO Mid 100% occlusion with grade 3 left-to-right collaterals.     PCV STRAIN IMAGING ECHOCARDIOGRAM 05/09/2022  Narrative Echocardiogram 05/09/2022: Moderately depressed LV systolic function with visual EF 35-40%. Left ventricle cavity is normal in size. Mild concentric hypertrophy of the left ventricle. Hypokinetic global wall motion. Indeterminate diastolic filling pattern. Calculated EF 34%. Left atrial cavity is mildly dilated at 37.1 ml/m^2. Trileaflet aortic valve.  Trace aortic regurgitation. Mild aortic valve leaflet thickening. Structurally normal mitral valve.  Mild (Grade I) mitral regurgitation. Structurally normal tricuspid valve.  Moderate tricuspid regurgitation. Mild pulmonary hypertension. RVSP measures 31 mmHg. Compared to 08/2019, EF is slightly worse (previously 40-45%) and pulmonary HTN is mild compared to moderate-severe.   EKG   EKG atrial fibrillation with controlled ventricular response at rate of 98 bpm, normal axis, poor R progression, cannot exclude anterolateral infarct old.  Frequent PVCs (5) In bigeminal pattern.  Compared to 05/13/2022, PVC number  has increased from 3.   Allergies   Allergies  Allergen Reactions   Farxiga [Dapagliflozin] Other (See Comments)    Abdominal cramps     Medications   Current Outpatient Medications:    allopurinol (ZYLOPRIM) 300 MG tablet, Take 300 mg by mouth daily., Disp: , Rfl:    atorvastatin (LIPITOR) 20 MG tablet, TAKE 1 TABLET BY MOUTH ONCE DAILY AT  6PM, Disp: 90 tablet, Rfl: 1   carvedilol (COREG) 25 MG tablet, Take 1.5 tablets (37.5 mg total) by mouth 2 (two) times daily., Disp: 270 tablet, Rfl: 3   furosemide (LASIX) 40 MG tablet, Take 1 tablet (40 mg total) by mouth every morning., Disp: 90 tablet, Rfl: 1   glipiZIDE (GLUCOTROL) 5 MG tablet, Take 5 mg by mouth daily before breakfast., Disp: , Rfl:    hydrALAZINE (APRESOLINE) 100 MG tablet, Take 100 mg by mouth 2 (two) times daily. , Disp: , Rfl:    ONETOUCH VERIO test strip, 1 each daily., Disp: , Rfl:    spironolactone (ALDACTONE) 25 MG tablet, TAKE 1 TABLET BY MOUTH ONCE DAILY IN THE MORNING, Disp: 90 tablet, Rfl: 0   Tamsulosin HCl (FLOMAX) 0.4 MG CAPS, Take 0.4 mg by mouth daily., Disp: , Rfl:    valsartan (DIOVAN) 80 MG tablet, Take 1 tablet (80 mg total) by mouth every evening., Disp: 90 tablet, Rfl: 3   warfarin (COUMADIN) 1 MG tablet, Take 1 tablet (1 mg total) by mouth as directed. 1  tab Daily in the evening as directed by clinic, Disp: 90 tablet, Rfl: 3   warfarin (COUMADIN) 2.5 MG tablet, Take 1 tablet (2.5 mg total) by mouth as directed. 2.5 mg  every Wed, Fri as directed, Disp: 90 tablet, Rfl: 3   Assessment      ICD-10-CM   1. Permanent atrial fibrillation  I48.21 warfarin (COUMADIN) 1 MG tablet    2. Chronic combined systolic and diastolic heart failure  I50.42 PCV ECHOCARDIOGRAM COMPLETE    3. Coronary artery disease involving native coronary artery of native heart with other form of angina pectoris  I25.118 PCV ECHOCARDIOGRAM COMPLETE     CHA2DS2-VASc Score is 5.  Yearly risk of stroke: 7.2% (A, HTN, CAD & CHF).   Score of 1=0.6; 2=2.2; 3=3.2; 4=4.8; 5=7.2; 6=9.8; 7=>9.8) -(CHF; HTN; vasc disease DM,  Male = 1; Age <65 =0; 65-74 = 1,  >75 =2; stroke/embolism= 2).     Meds ordered this encounter  Medications   warfarin (COUMADIN) 1 MG tablet    Sig: Take 1 tablet (1 mg total) by mouth as directed. 1 tab Daily in the evening as directed by clinic    Dispense:  90 tablet    Refill:  3   Medications Discontinued During This Encounter  Medication Reason   dicyclomine (BENTYL) 10 MG capsule    Semaglutide(0.25 or 0.5MG /DOS) SOPN 0.25 mg    warfarin (COUMADIN) 5 MG tablet Dose change   Recommendations:   Gregory [name]  is a 77 y.o.  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  Patient is in Remote Patient Monitoring and Principal Care Management as patient is high risk for hospitalization and complications from underlying medical conditions.   1. Permanent atrial fibrillation Patient's INR was elevated, INR is being followed at Coumadin, since he has been very strict with his diet suspect his marked elevation in INR is related to this.  He will continue close monitoring of his INR in our office.  - warfarin (COUMADIN) 1 MG tablet; Take 1 tablet (1 mg total) by mouth as directed. 1 tab Daily in the evening as directed by clinic  Dispense: 90 tablet; Refill: 3  2. Chronic combined systolic and diastolic heart failure Patient is feeling the best he has in quite a while since weight loss.  I had started him on Ozempic however he could not tolerate this due to severe nausea and vomiting.  He is now trying his best on his own and trying to be careful with his diet and has maintained weight loss.  He is presently on RPM and weight has remained very stable.  No clinical  evidence of heart failure.  Continue present medical management.  I will repeat echocardiogram.  - PCV ECHOCARDIOGRAM COMPLETE; Future  RPM Data Patient weight has fluctuated. Patient 30-d average and range is below -    Patient home weight is down and stable. Last OV weight in March 2024 was 261 lb.   14-d average weight lb -- 252.3 (251.3 - 255.7)      3. Coronary artery disease involving native coronary artery of native heart with other form of angina pectoris Cardiac standpoint he has remained stable without recurrence of angina pectoris, dyspnea could be his anginal equivalent however overall stable.   Yates Decamp, MD, [facility] 10/16/2022, 6:08 PM  Office: [phone] Fax: [phone] Pager: [phone]

## 2022-10-16 NOTE — Telephone Encounter (Signed)
Patient home weight is down and stable. Last OV weight in March 2024 was 261 lb.  14-d average weight lb -- 252.3 (251.3 - 255.7)

## 2022-10-16 NOTE — Progress Notes (Signed)
Description   10/16/2022    INR today was 5.0. Patient goal level is (2.0-3.0)  Prior dose was 2.5 mg daily.  Patient will now start on 1 mg Monday, Wednesday, Friday, and 2.5 mg all the other days.   Pt will recheck INR 2 weeks on 05/08.    Lab Results      Component                Value               Date                      INR                      5.0 (A)             10/16/2022                INR                      1.9 (A)             09/25/2022                INR                      >8                  09/18/2022              Chronic combined systolic and diastolic heart failure  (primary encounter diagnosis) Plan: POCT INR  Coronary artery disease involving native coronary artery of native heart with other form of angina pectoris  Permanent atrial fibrillation     Patient has been very strict with his diet and is lost significant amount of weight suspect variation in INR is related to this.

## 2022-10-30 ENCOUNTER — Ambulatory Visit: Payer: Medicare Other | Admitting: Cardiology

## 2022-10-30 DIAGNOSIS — I4821 Permanent atrial fibrillation: Secondary | ICD-10-CM

## 2022-10-30 LAB — POCT INR: INR: 3.6 — AB (ref 2.0–3.0)

## 2022-10-30 NOTE — Progress Notes (Signed)
Description   10/30/2022    INR today was 3.6. Patient goal level is (2.0-3.0)  Prior dose was 5 mg Wednesday, and 2.5 mg all the other days.  Patient will now start on 2.5 mg daily.   Pt will recheck INR 3 weeks on 05/29.   Lab Results      Component                Value               Date                      INR                      3.6 (A)             10/30/2022                INR                      5.0 (A)             10/16/2022                INR                      5.0 (A)             10/16/2022                       Chronic combined systolic and diastolic heart failure  (primary encounter diagnosis) Plan: POCT INR  Coronary artery disease involving native coronary artery of native heart with other form of angina pectoris  Permanent atrial fibrillation

## 2022-11-05 ENCOUNTER — Other Ambulatory Visit: Payer: Self-pay | Admitting: Cardiology

## 2022-11-05 ENCOUNTER — Telehealth: Payer: Self-pay

## 2022-11-05 DIAGNOSIS — I5043 Acute on chronic combined systolic (congestive) and diastolic (congestive) heart failure: Secondary | ICD-10-CM

## 2022-11-05 DIAGNOSIS — I4821 Permanent atrial fibrillation: Secondary | ICD-10-CM

## 2022-11-05 NOTE — Telephone Encounter (Signed)
ICD-10-CM   1. Permanent atrial fibrillation (HCC)  I48.21      Patient will be starting ciprofloxacin for 2 weeks starting 11/05/2022.  Will reduce the dose of the warfarin from 2.5 mg daily to 2 mg daily, close monitoring of his INR every 4 days.  To come back in for INR check on 11/08/2022

## 2022-11-05 NOTE — Telephone Encounter (Signed)
Patient called to advise you that he has been prescribed Cipro 500 mg bid for 14 days, pharmacist told him to let you know due to his warfarin

## 2022-11-06 NOTE — Telephone Encounter (Signed)
Patient aware.

## 2022-11-08 ENCOUNTER — Ambulatory Visit: Payer: Medicare Other | Admitting: Cardiology

## 2022-11-08 ENCOUNTER — Telehealth: Payer: Self-pay

## 2022-11-08 DIAGNOSIS — I4821 Permanent atrial fibrillation: Secondary | ICD-10-CM

## 2022-11-08 LAB — POCT INR: POC INR: 2.7

## 2022-11-08 NOTE — Telephone Encounter (Signed)
Patient's weight/fluid status is well controlled on current regimen. Will continue to monitor patient.   11/07/22 9:55 AM 250.7    11/06/22 9:57 AM 249.1    11/05/22 9:49 AM 251.3    11/04/22 9:34 AM 249.1    11/03/22 10:50 AM 246.9    11/01/22 11:28 AM 246.9    10/31/22 10:18 AM 246.9    10/30/22 9:56 AM 246.9    10/29/22 11:51 AM 246.9    10/28/22 12:10 PM 246.9    10/27/22 12:18 PM 249.1    10/26/22 12:15 PM 246.9    10/25/22 11:51 AM 246.9    10/23/22 10:27 AM 249.1

## 2022-11-08 NOTE — Progress Notes (Signed)
Description   11/08/2022    INR today was 2.7. Patient goal level is (2.0-3.0)  Prior dose was 2.5 mg everyday.  patient will continue with this dosage plan.  Pt will recheck INR 2 weeks on 11/22/2022.  Lab Results      Component                Value               Date                      INR                      2.7                 11/08/2022                INR                      3.6 (A)             10/30/2022                INR                      5.0 (A)             10/16/2022            Permanent atrial fibrillation (HCC)  (primary encounter diagnosis) Plan: POCT INR

## 2022-11-09 ENCOUNTER — Other Ambulatory Visit: Payer: Self-pay | Admitting: Cardiology

## 2022-11-09 DIAGNOSIS — I10 Essential (primary) hypertension: Secondary | ICD-10-CM

## 2022-11-12 ENCOUNTER — Other Ambulatory Visit: Payer: Self-pay

## 2022-11-12 DIAGNOSIS — I4821 Permanent atrial fibrillation: Secondary | ICD-10-CM

## 2022-11-12 MED ORDER — WARFARIN SODIUM 1 MG PO TABS
1.0000 mg | ORAL_TABLET | ORAL | 3 refills | Status: DC
Start: 2022-11-12 — End: 2023-08-06

## 2022-11-21 ENCOUNTER — Telehealth: Payer: Self-pay

## 2022-11-21 ENCOUNTER — Ambulatory Visit: Payer: Medicare Other | Admitting: Cardiology

## 2022-11-21 DIAGNOSIS — I4821 Permanent atrial fibrillation: Secondary | ICD-10-CM

## 2022-11-21 LAB — POCT INR: INR: 1.7 — AB (ref 2.0–3.0)

## 2022-11-21 MED ORDER — WARFARIN SODIUM 2.5 MG PO TABS: 2.5000 mg | ORAL_TABLET | ORAL | 3 refills | Status: DC

## 2022-11-21 MED ORDER — WARFARIN SODIUM 3 MG PO TABS: 3.0000 mg | ORAL_TABLET | ORAL | 2 refills | Status: DC

## 2022-11-21 NOTE — Telephone Encounter (Signed)
Patient's weight is currently controlled. Patient does not report any concerns. No actionable items at this time.   30-day weight avg: 248.8 (242.9 - 252.6)  11/20/22 9:18 AM 252.6    11/19/22 9:18 AM 251.3    11/18/22 10:49 AM 249.3    11/17/22 10:27 AM 248.2    11/16/22 10:13 AM 251.1    11/15/22 10:22 AM 249.6    11/14/22 9:04 AM 251.1    11/13/22 9:59 AM 247.4    11/12/22 10:11 AM 249.3    11/11/22 9:44 AM 249.8    11/10/22 10:38 AM 242.9    11/09/22 9:22 AM 249.6    11/08/22 9:38 AM 251.5    11/07/22 9:55 AM 250.7    11/06/22 9:57 AM 249.1    11/05/22 9:49 AM 251.3    11/04/22 9:34 AM 249.1    11/03/22 10:50 AM 246.9    11/01/22 11:28 AM 246.9    10/31/22 10:18 AM 246.9

## 2022-11-23 NOTE — Progress Notes (Signed)
Description   11/21/2022    INR today was 1.7. Patient goal level is (2.0-3.0)  Prior dose was 2.5 mg daily.  Patient will now start on 3 mg on Monday, Thursday, and Saturday and 2.5 mg all the other days.   Pt will recheck INR 3 weeks on 06/20.   Lab Results      Component                Value               Date                      INR                      1.7 (A)             11/21/2022                INR                      2.7                 11/08/2022                INR                      3.6 (A)             10/30/2022                  Permanent atrial fibrillation (HCC)  (primary encounter diagnosis) Plan: POCT INR    Meds ordered this encounter  Medications   warfarin (COUMADIN) 2.5 MG tablet    Sig: Take 1 tablet (2.5 mg total) by mouth as directed.    Dispense:  90 tablet    Refill:  3    3 mg on Monday, Thursday, and Saturday and 2.5 mg all the other days.   warfarin (COUMADIN) 3 MG tablet    Sig: Take 1 tablet (3 mg total) by mouth as directed.    Dispense:  90 tablet    Refill:  2    3 mg on Monday, Thursday, and Saturday and 2.5 mg all the other days.

## 2022-12-12 ENCOUNTER — Ambulatory Visit: Payer: Medicare Other | Admitting: Cardiology

## 2022-12-12 DIAGNOSIS — I4821 Permanent atrial fibrillation: Secondary | ICD-10-CM

## 2022-12-12 LAB — POCT INR
INR: 2.4 (ref 2.0–3.0)
POC INR: 2.4

## 2022-12-12 NOTE — Progress Notes (Signed)
Description   12/12/22  INR today was 2.4 Patient goal level is (2.0-3.0)  Prior dose was 2.5 mg Sunday Tuesday wed fri, 3 mg mon thu sat  No changes were made today  Pt will recheck INR 3 weeks on 01/02/2023.   Lab Results      Component                Value               Date                      INR                      1.7 (A)             11/21/2022                INR                      2.7                 11/08/2022                INR                      3.6 (A)             05 /01/2023                  Permanent atrial fibrillation (HCC)  (primary encounter diagnosis) Plan: POCT INR    No orders of the defined types were placed in this encounter.

## 2022-12-16 ENCOUNTER — Other Ambulatory Visit: Payer: Self-pay | Admitting: Cardiology

## 2022-12-16 DIAGNOSIS — I5033 Acute on chronic diastolic (congestive) heart failure: Secondary | ICD-10-CM

## 2022-12-30 ENCOUNTER — Ambulatory Visit: Payer: Medicare Other

## 2022-12-30 ENCOUNTER — Ambulatory Visit: Payer: Medicare Other | Admitting: Cardiology

## 2022-12-30 DIAGNOSIS — I25118 Atherosclerotic heart disease of native coronary artery with other forms of angina pectoris: Secondary | ICD-10-CM

## 2022-12-30 DIAGNOSIS — I5042 Chronic combined systolic (congestive) and diastolic (congestive) heart failure: Secondary | ICD-10-CM

## 2022-12-30 DIAGNOSIS — I4821 Permanent atrial fibrillation: Secondary | ICD-10-CM

## 2022-12-30 LAB — POCT INR: INR: 2.5 (ref 2.0–3.0)

## 2022-12-30 NOTE — Progress Notes (Signed)
Description   12/30/2022  INR today was 2.5 Patient goal level is (2.0-3.0)  Prior dose was 2.5mg  on Sun, Tue, Wed, Friday and 3mg  all other days.  Patient will continue 2.5mg  on Sun, Tue, Wed, Friday and 3mg  all other days.  Patient recheck INR in 1 month on 02/03/2023.  Lab Results      Component                Value               Date                      INR                      2.5                 12/30/2022                INR                      2.4                 12/12/2022                INR                      1.7                 11/11/2022         Permanent atrial fibrillation (HCC)  (primary encounter diagnosis) Plan: POCT INR    No orders of the defined types were placed in this encounter.

## 2023-01-07 NOTE — Progress Notes (Signed)
Echocardiogram 12/30/2022: Left ventricle cavity is normal in size. Normal left ventricular wall thickness. Moderate global hypokinesis. LVEF 35-40%. Unable to evaluate diastolic function due to atrial fibrillation.  Left atrial cavity is severely dilated at 49.3 ml/m^2. Trileaflet aortic valve. Mild aortic valve leaflet calcification. Trace aortic regurgitation. Mild calcification of the mitral valve annulus. Mild to moderate mitral regurgitation. Mild tricuspid regurgitation No evidence of pulmonary hypertension. Previous study on 05/10/2023 reported mild LA dilatation, moderate TR.

## 2023-01-15 ENCOUNTER — Ambulatory Visit: Payer: Medicare Other | Admitting: Cardiology

## 2023-01-15 ENCOUNTER — Encounter: Payer: Self-pay | Admitting: Cardiology

## 2023-01-15 VITALS — BP 124/68 | HR 90 | Resp 16 | Ht 71.0 in | Wt 255.4 lb

## 2023-01-15 DIAGNOSIS — I25118 Atherosclerotic heart disease of native coronary artery with other forms of angina pectoris: Secondary | ICD-10-CM

## 2023-01-15 DIAGNOSIS — I10 Essential (primary) hypertension: Secondary | ICD-10-CM

## 2023-01-15 DIAGNOSIS — I5042 Chronic combined systolic (congestive) and diastolic (congestive) heart failure: Secondary | ICD-10-CM

## 2023-01-15 DIAGNOSIS — I4821 Permanent atrial fibrillation: Secondary | ICD-10-CM

## 2023-01-15 NOTE — Progress Notes (Signed)
Primary Physician/Referring:  [name], FNP  Patient ID: Gregory [name], male    DOB: 1946/06/05, 77 y.o.   MRN: [id number]  Chief Complaint  Patient presents with   Hypertension   Permanent atrial fibrillation (HCC)   Chronic combined systolic and diastolic heart failure (HCC)   Follow-up   HPI:    Gregory [name]  is a 77 y.o. 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   His last hospitalization was from 08/23/2022 for acute decompensated heart failure.  He presents for a 3 month visit,  with Ozempic he lost significant amount of weight however could not tolerate Ozempic due to severe nausea or vomiting.  Presently except for mild chronic dyspnea, he has not had any chest pain, dizziness or syncope.  Past Medical History:  Diagnosis Date   CAD (coronary artery disease)    COPD (chronic obstructive pulmonary disease) (HCC)    Diabetes mellitus    DOE (dyspnea on exertion) 09/23/2012   CXR 09/2012:  Basilar atx.  Ambulatory ox 09/2012:  desat to 86% on RA.  Now normal.   V/Q 2014:  Normal perfusion.    Heart attack (HCC)    Hyperlipidemia    Hypertension    Neuromuscular disorder (HCC)    SOB (shortness of breath) on exertion    Past Surgical History:  Procedure Laterality Date   BACK SURGERY     CARDIAC CATHETERIZATION     CORONARY STENT PLACEMENT     KNEE ARTHROPLASTY     LEFT HEART CATH AND CORONARY ANGIOGRAPHY N/A 03/19/2019   Procedure: LEFT HEART CATH AND CORONARY ANGIOGRAPHY;  Surgeon: Elder Negus, MD;  Location: [facility] INV[facility]IVE [facility];  Service: Cardiovascular;  Laterality: N/A;   LEFT HEART CATHETERIZATION WITH CORONARY ANGIOGRAM N/A 03/03/2012   Procedure: LEFT HEART CATHETERIZATION WITH CORONARY ANGIOGRAM;  Surgeon: Pamella Pert, MD;  Location: [facility] CATH LAB;  Service: Cardiovascular;  Laterality: N/A;   PERCUTANEOUS CORONARY STENT INTERVENTION (PCI-S)  03/03/2012   Procedure: PERCUTANEOUS CORONARY STENT INTERVENTION (PCI-S);  Surgeon: Pamella Pert, MD;  Location: [facility] CATH LAB;  Service: Cardiovascular;;   Social History   Tobacco Use   Smoking status: Former    Current packs/day: 0.00    Average packs/day: 1 pack/day for 30.0 years (30.0 ttl pk-yrs)    Types: Cigarettes    Start date: 07/29/1973    Quit date: 07/30/2003    Years since quitting: 19.4   Smokeless tobacco: Never  Substance Use Topics   Alcohol use: Yes    Alcohol/week: 0.0 standard drinks of alcohol    Comment: wine rarely    Marital Status: Married  ROS  Review of Systems  Cardiovascular:  Positive for dyspnea on exertion (stable) and leg swelling (stable). Negative for chest pain.   Objective      01/15/2023   12:50 PM 10/16/2022   11:04 AM 09/18/2022    1:05 PM  Vitals with BMI  Height 5\' 11"  5\' 11"  5\' 11"   Weight 255 lbs 6 oz 249 lbs 261 lbs 6 oz  BMI 35.64 34.74 36.47  Systolic 124 110 578  Diastolic 68 70 61  Pulse 90 60 60    Blood pressure 124/68, pulse 90, resp. rate 16, height 5\' 11"  (1.803 m), weight 255 lb 6.4 oz (115.8  kg), SpO2 98%. Body mass index is 35.62 kg/m.   Physical Exam Vitals reviewed.  Constitutional:      Appearance: He is obese.     Comments: He is well-built and moderately obese in no acute distress.  Neck:     Vascular: No carotid bruit or JVD.  Cardiovascular:     Rate and Rhythm: Normal rate. Rhythm irregular.     Pulses: Normal pulses and intact distal pulses.     Heart sounds: No murmur heard.    No gallop. No S3 or S4 sounds.     Comments:   Pulmonary:     Effort: Pulmonary effort is normal.     Breath sounds: Normal breath sounds.  Abdominal:     General: Bowel sounds are normal.     Palpations: Abdomen is soft.     Comments: Obese.  Musculoskeletal:     Right lower leg: Edema  (1-2+ pitting below knee) present.     Left lower leg: Edema (1-2+ pitting below knee) present.    Laboratory examination:   Recent Labs    05/14/22 1447 08/23/22 1128 10/02/22 1122  NA 140 138 138  K 4.0 3.6 4.0  CL 103 100 100  CO2 30 25 23   GLUCOSE 103* 109* 86  BUN 17 23 24   CREATININE 1.64* 1.60* 1.93*  CALCIUM 9.6 7.8* 8.6  GFRNONAA 43* 44*  --       Latest Ref Rng & Units 10/02/2022   11:22 AM 08/23/2022   11:28 AM 05/14/2022    2:47 PM  CMP  Glucose 70 - 99 mg/dL 86  213  086   BUN 8 - 27 mg/dL 24  23  17    Creatinine 0.76 - 1.27 mg/dL 5.78  4.69  6.29   Sodium 134 - 144 mmol/L 138  138  140   Potassium 3.5 - 5.2 mmol/L 4.0  3.6  4.0   Chloride 96 - 106 mmol/L 100  100  103   CO2 20 - 29 mmol/L 23  25  30    Calcium 8.6 - 10.2 mg/dL 8.6  7.8  9.6   Total Protein 6.5 - 8.1 g/dL   7.4   Total Bilirubin 0.3 - 1.2 mg/dL   0.9   Alkaline Phos 38 - 126 U/L   115   AST 15 - 41 U/L   18   ALT 0 - 44 U/L   13       Latest Ref Rng & Units 08/23/2022   11:28 AM 05/14/2022    2:47 PM 09/21/2021   10:55 AM  CBC  WBC 4.0 - 10.5 K/uL 11.2  7.3  6.5   Hemoglobin 13.0 - 17.0 g/dL 52.8  41.3  24.4   Hematocrit 39.0 - 52.0 % 46.0  48.4  41.3   Platelets 150 - 400 K/uL 198  219  179    Lab Results  Component Value Date   CHOL 148 03/19/2019   HDL 32 (L) 03/19/2019   LDLCALC 90 03/19/2019   TRIG 132 03/19/2019   CHOLHDL 4.6 03/19/2019     HEMOGLOBIN A1C Lab Results  Component Value Date   HGBA1C 6.0 (H) 03/18/2019   MPG 125.5 03/18/2019   No results found for: "TSH"   BNP (last 3 results) Recent Labs    08/23/22 1128  BNP 823.2*    ProBNP (last 3 results) Recent Labs    04/09/22 1454 10/02/22 1122  PROBNP 2,421* 4,318*   SPEP with Reflex 09/21/2021:  The SPE pattern appears unremarkable. Evidence of monoclonal protein is not apparent.  External labs:  Hemoglobin 13.500 g/d 07/26/2020 Platelets 235.000 K/ 07/26/2020  Creatinine, Serum 1.790 mg/  06/01/2020 Potassium 3.800 mm 06/01/2020 ALT (SGPT) 9.000 U/L 06/01/2020  TSH 4.350 08/23/2019  Lab 01/17/2020:  Serum glucose 109 mg, BUN 55, creatinine 2.6, EGFR 29.3 mL, sodium 140, potassium 4.8.  PSA 0.777 ng/ 08/23/2019    Radiology:   Myocardial amyloid imaging planar and SPECT 05/03/2022:   By semi-quantitative assessment scan is consistent with increased heart uptake but less than rib uptake-Grade 1. Heart to contralateral lung ratio is between 1-1.5, indeterminate for amyloid.   Study is equivocal for TTR amyloidosis (visual score of 1/ratio between 1-1.5).  Cardiac Studies:   Coronary Angiography 03/19/2019:  No change from 03/06/2012: LM: Normal LAD: Mid LAD focal 40% stenosis          Ostial D-2 80% stenosis with TIMI III flow. Unchanged compared to 2013 angiography. Patent mid Diag 2 stent without ISR LCx: Patent mid LCX overlapping stents with 10-20% late lumen loss RCA: CTO Mid 100% occlusion with grade 3 left-to-right collaterals.     Echocardiogram 12/30/2022: Left ventricle cavity is normal in size. Normal left ventricular wall thickness. Moderate global hypokinesis. LVEF 35-40%. Unable to evaluate diastolic function due to atrial fibrillation.  Left atrial cavity is severely dilated at 49.3 ml/m^2. Trileaflet aortic valve. Mild aortic valve leaflet calcification. Trace aortic regurgitation. Mild calcification of the mitral valve annulus. Mild to moderate mitral regurgitation. Mild tricuspid regurgitation No evidence of pulmonary hypertension. Previous study on 05/10/2023 reported mild LA dilatation, moderate TR.  EKG   EKG 01/15/2023: Atrial fibrillation with controlled ventricular response with rate of 91 bpm, normal axis, poor R progression, cannot exclude anteroseptal infarct old.  Frequent PVCs in bigeminal pattern.  Nonspecific T abnormality.  Compared to 08/28/2022, no significant change.  Allergies   Allergies  Allergen Reactions   Farxiga [Dapagliflozin]  Other (See Comments)    Abdominal cramps     Medications   Current Outpatient Medications:    allopurinol (ZYLOPRIM) 300 MG tablet, Take 300 mg by mouth daily., Disp: , Rfl:    atorvastatin (LIPITOR) 20 MG tablet, TAKE 1 TABLET BY MOUTH ONCE DAILY AT  6PM, Disp: 90 tablet, Rfl: 1   carvedilol (COREG) 25 MG tablet, TAKE 1 AND 1/2 TABLETS BY MOUTH  TWICE DAILY, Disp: 300 tablet, Rfl: 2   furosemide (LASIX) 40 MG tablet, TAKE 1 TABLET BY MOUTH EVERY  MORNING, Disp: 100 tablet, Rfl: 2   glipiZIDE (GLUCOTROL) 5 MG tablet, Take 5 mg by mouth daily before breakfast., Disp: , Rfl:    hydrALAZINE (APRESOLINE) 100 MG tablet, Take 100 mg by mouth 2 (two) times daily. , Disp: , Rfl:    ONETOUCH VERIO test strip, 1 each daily., Disp: , Rfl:    spironolactone (ALDACTONE) 25 MG tablet, TAKE 1 TABLET BY MOUTH ONCE DAILY IN THE MORNING, Disp: 90 tablet, Rfl: 3   Tamsulosin HCl (FLOMAX) 0.4 MG CAPS, Take 0.4 mg by mouth daily., Disp: , Rfl:    valsartan (DIOVAN) 80 MG tablet, Take 1 tablet (80 mg total) by mouth every evening., Disp: 90 tablet, Rfl: 3   warfarin (COUMADIN) 1 MG tablet, Take 1 tablet (1 mg total) by mouth as directed. 1 tab Daily in the evening as directed by clinic, Disp: 90 tablet, Rfl: 3   warfarin (COUMADIN) 2.5 MG tablet, Take 1 tablet (2.5 mg total) by mouth as directed. 2.5  mg  every Wed, Fri as directed, Disp: 90 tablet, Rfl: 3   warfarin (COUMADIN) 2.5 MG tablet, Take 1 tablet (2.5 mg total) by mouth as directed., Disp: 90 tablet, Rfl: 3   warfarin (COUMADIN) 3 MG tablet, Take 1 tablet (3 mg total) by mouth as directed., Disp: 90 tablet, Rfl: 2   Assessment      ICD-10-CM   1. Permanent atrial fibrillation (HCC)  I48.21 EKG 12-Lead    2. Chronic combined systolic and diastolic heart failure (HCC)  T01.60     3. Coronary artery disease involving native coronary artery of native heart with other form of angina pectoris (HCC)  I25.118     4. Essential hypertension  I10       CHA2DS2-VASc Score is 5.  Yearly risk of stroke: 7.2% (A, HTN, CAD & CHF).  Score of 1=0.6; 2=2.2; 3=3.2; 4=4.8; 5=7.2; 6=9.8; 7=>9.8) -(CHF; HTN; vasc disease DM,  Male = 1; Age <65 =0; 65-74 = 1,  >75 =2; stroke/embolism= 2).     No orders of the defined types were placed in this encounter.  There are no discontinued medications.  Recommendations:   Gregory [name]  is a 77 y.o.  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  Patient is in Remote Patient Monitoring and Principal Care Management as patient is high risk for hospitalization and complications from underlying medical conditions.   1. Permanent atrial fibrillation (HCC) Patient is rate controlled, on carvedilol.  He is also on anticoagulation with warfarin and INR is being managed by [place], INR has been therapeutic.  Lab Results  Component Value Date   INR 2.5 12/30/2022   INR 2.4 12/12/2022   INR 2.4 12/12/2022   - EKG 12-Lead  2. Chronic combined systolic and diastolic heart failure (HCC) Patient's echocardiogram reveals stable LVEF at around 35 to 40%, no clinical evidence of heart failure.  3. Coronary artery disease involving native coronary artery of native heart with other form of angina pectoris (HCC) He has not had any chest discomfort per se, he has not had any recent hospitalization with heart failure as well.  He is on a beta-blocker, ARB and also spironolactone, continue the same.  4. Essential hypertension Blood pressure under excellent control.  Renal function has remained stable.  I will see him back in 6 months or sooner if problems.    Yates Decamp, MD, [facility] 01/15/2023, 1:41 PM Office: [phone] Fax: [phone] Pager: [phone]

## 2023-02-03 ENCOUNTER — Ambulatory Visit: Payer: Medicare Other

## 2023-02-04 ENCOUNTER — Ambulatory Visit: Payer: Medicare Other

## 2023-02-04 DIAGNOSIS — I4821 Permanent atrial fibrillation: Secondary | ICD-10-CM

## 2023-02-04 LAB — POCT INR: INR: 3.1 — AB (ref 2.0–3.0)

## 2023-03-06 ENCOUNTER — Ambulatory Visit: Payer: Medicare Other | Admitting: Cardiology

## 2023-03-06 DIAGNOSIS — I4821 Permanent atrial fibrillation: Secondary | ICD-10-CM

## 2023-03-06 LAB — POCT INR: INR: 3.1 — AB (ref 2.0–3.0)

## 2023-03-09 NOTE — Progress Notes (Signed)
Description   03/06/2023  INR today was 3.1 Patient goal level is (2.0-3.0)  Prior dose was 3mg  on Mon,Thurs and Sat and 2.5 all other days.  Patient will continue dose of 3mg  on Mon,Thurs and Sat and 2.5 all other days.  Patient recheck INR in 6 weeks on 04/17/23  Lab Results      Component                Value               Date                      INR                      3.1 (A)             03/06/2023                INR                      3.1 (A)             02/04/2023                INR                      2.5                 12/30/2022                      Permanent atrial fibrillation (HCC)  (primary encounter diagnosis) Plan: POCT INR    No orders of the defined types were placed in this encounter.

## 2023-03-12 ENCOUNTER — Telehealth: Payer: Self-pay | Admitting: Pharmacist

## 2023-03-12 NOTE — Telephone Encounter (Signed)
Pt returned call, appt scheduled at [facility] for 10/24, pt requests close to 1pm.

## 2023-03-12 NOTE — Telephone Encounter (Signed)
Left message for pt. Need to move upcoming INR check at [facility] to [facility].

## 2023-04-17 ENCOUNTER — Ambulatory Visit: Payer: Medicare Other

## 2023-04-18 ENCOUNTER — Other Ambulatory Visit: Payer: Self-pay

## 2023-04-18 ENCOUNTER — Ambulatory Visit: Payer: Medicare Other | Attending: Cardiovascular Disease

## 2023-04-18 DIAGNOSIS — I4821 Permanent atrial fibrillation: Secondary | ICD-10-CM

## 2023-04-18 DIAGNOSIS — Z5181 Encounter for therapeutic drug level monitoring: Secondary | ICD-10-CM | POA: Diagnosis not present

## 2023-04-18 LAB — POCT INR: INR: 3 (ref 2.0–3.0)

## 2023-04-18 NOTE — Patient Instructions (Signed)
TAKE 3mg  ON MONDAY, THURSDAY and SATURDAY and 2.5 mg ON SUNDAY, TUESDAY, WEDNESDAY and FRIDAY.  INR in 4 weeks. [phone].  A full discussion of the nature of anticoagulants has been carried out.  A benefit risk analysis has been presented to the patient, so that they understand the justification for choosing anticoagulation at this time. The need for frequent and regular monitoring, precise dosage adjustment and compliance is stressed.  Side effects of potential bleeding are discussed.  The patient should avoid any OTC items containing aspirin or ibuprofen, and should avoid great swings in general diet.  Avoid alcohol consumption.  Call if any signs of abnormal bleeding.

## 2023-05-06 ENCOUNTER — Other Ambulatory Visit: Payer: Self-pay | Admitting: Cardiology

## 2023-05-15 ENCOUNTER — Ambulatory Visit: Payer: Medicare Other

## 2023-05-19 ENCOUNTER — Ambulatory Visit: Payer: Medicare Other | Attending: Cardiology

## 2023-05-19 DIAGNOSIS — Z5181 Encounter for therapeutic drug level monitoring: Secondary | ICD-10-CM

## 2023-05-19 DIAGNOSIS — I4821 Permanent atrial fibrillation: Secondary | ICD-10-CM

## 2023-05-19 LAB — POCT INR: INR: 2.8 (ref 2.0–3.0)

## 2023-05-19 NOTE — Patient Instructions (Signed)
TAKE 3mg  ON MONDAY, THURSDAY and SATURDAY and 2.5 mg ON SUNDAY, TUESDAY, WEDNESDAY and FRIDAY.  INR in 6 weeks. [phone].

## 2023-07-01 ENCOUNTER — Ambulatory Visit: Payer: Medicare Other | Attending: Cardiology

## 2023-07-01 DIAGNOSIS — Z5181 Encounter for therapeutic drug level monitoring: Secondary | ICD-10-CM

## 2023-07-01 DIAGNOSIS — I4821 Permanent atrial fibrillation: Secondary | ICD-10-CM

## 2023-07-01 LAB — POCT INR: INR: 4.6 — AB (ref 2.0–3.0)

## 2023-07-01 NOTE — Patient Instructions (Signed)
 HOLD TODAY, WEDNESDAY and THURSDAY THEN CONTINUE 3mg  ON MONDAY, THURSDAY and SATURDAY and 2.5 mg ON SUNDAY, TUESDAY, WEDNESDAY and FRIDAY.  INR in 3 weeks. [phone].

## 2023-07-21 ENCOUNTER — Ambulatory Visit: Payer: Self-pay | Admitting: Cardiology

## 2023-07-22 ENCOUNTER — Ambulatory Visit: Payer: Medicare Other | Attending: Cardiology

## 2023-07-22 DIAGNOSIS — Z5181 Encounter for therapeutic drug level monitoring: Secondary | ICD-10-CM

## 2023-07-22 DIAGNOSIS — I4821 Permanent atrial fibrillation: Secondary | ICD-10-CM | POA: Diagnosis not present

## 2023-07-22 LAB — POCT INR: INR: 2.6 (ref 2.0–3.0)

## 2023-07-22 NOTE — Patient Instructions (Signed)
CONTINUE 3mg  ON MONDAY, THURSDAY and SATURDAY and 2.5 mg ON SUNDAY, TUESDAY, WEDNESDAY and FRIDAY.  INR in 5 weeks. [phone].

## 2023-08-06 ENCOUNTER — Other Ambulatory Visit: Payer: Self-pay | Admitting: Cardiology

## 2023-08-06 DIAGNOSIS — Z7901 Long term (current) use of anticoagulants: Secondary | ICD-10-CM

## 2023-08-06 DIAGNOSIS — I4821 Permanent atrial fibrillation: Secondary | ICD-10-CM

## 2023-08-06 MED ORDER — WARFARIN SODIUM 3 MG PO TABS: ORAL_TABLET | ORAL | 0 refills | Status: DC

## 2023-08-06 NOTE — Telephone Encounter (Signed)
Warfarin 3mg  & 2.5mg   Afib Last INR 07/22/23 Last OV 01/15/23

## 2023-08-26 ENCOUNTER — Ambulatory Visit: Payer: Medicare Other | Attending: Cardiovascular Disease

## 2023-08-26 DIAGNOSIS — I4821 Permanent atrial fibrillation: Secondary | ICD-10-CM

## 2023-08-26 DIAGNOSIS — Z5181 Encounter for therapeutic drug level monitoring: Secondary | ICD-10-CM | POA: Diagnosis not present

## 2023-08-26 LAB — POCT INR: INR: 3.4 — AB (ref 2.0–3.0)

## 2023-08-26 NOTE — Patient Instructions (Signed)
 CONTINUE 3mg  ON MONDAY, THURSDAY and SATURDAY and 2.5 mg ON SUNDAY, TUESDAY, WEDNESDAY and FRIDAY.  INR in 5 weeks. [phone].  Eat greens tonight.

## 2023-09-02 ENCOUNTER — Encounter: Payer: Self-pay | Admitting: Cardiology

## 2023-09-02 ENCOUNTER — Ambulatory Visit: Payer: Medicare Other | Attending: Cardiology | Admitting: Cardiology

## 2023-09-02 VITALS — BP 118/84 | HR 89 | Resp 16 | Ht 71.0 in | Wt 256.6 lb

## 2023-09-02 DIAGNOSIS — I5042 Chronic combined systolic (congestive) and diastolic (congestive) heart failure: Secondary | ICD-10-CM

## 2023-09-02 DIAGNOSIS — I4821 Permanent atrial fibrillation: Secondary | ICD-10-CM | POA: Diagnosis not present

## 2023-09-02 DIAGNOSIS — I25118 Atherosclerotic heart disease of native coronary artery with other forms of angina pectoris: Secondary | ICD-10-CM | POA: Diagnosis not present

## 2023-09-02 NOTE — Progress Notes (Signed)
 Cardiology Office Note:  .   Date:  09/02/2023  ID:  Gregory [name], DOB [birth year], MRN [id number] PCP: [name], FNP  [facility] Providers Cardiologist:  Yates Decamp, MD   History of Present Illness: .   Gregory [name] is a 78 y.o. 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 His last hospitalization was from 08/23/2022 for acute decompensated heart failure.   He is presently doing well and except for mild chronic dyspnea, has no specific complaints.  States that he is feeling the best he has in quite a while.  Discussed the use of AI scribe software for clinical note transcription with the patient, who gave verbal consent to proceed.  History of Present Illness   The patient, with a history of non-ischemic cardiomyopathy, hypertension, and atrial fibrillation, presents for a routine follow-up. He reports no leg swelling and overall feels well, with no new or worsening symptoms. He has been adhering to his prescribed medication regimen, which includes Coreg 25mg  once daily, Valsartan 80mg  once daily, and Warfarin. These medications have effectively managed his blood pressure, heart failure symptoms, and atrial fibrillation.  The patient also mentions an upcoming cataract surgery, scheduled for the following week. He expresses no concerns about this procedure. He also discusses plans to change his primary care provider due to dissatisfaction with the current practice's communication and transparency.     Labs   Lab Results  Component Value Date   CHOL 148 03/19/2019   HDL 32 (L) 03/19/2019   LDLCALC 90 03/19/2019   TRIG 132 03/19/2019   CHOLHDL 4.6 03/19/2019   Lab Results  Component Value Date   NA 138 10/02/2022   K 4.0 10/02/2022   CO2 23  10/02/2022   GLUCOSE 86 10/02/2022   BUN 24 10/02/2022   CREATININE 1.93 (H) 10/02/2022   CALCIUM 8.6 10/02/2022   GFR 23.69 (L) 09/23/2012   EGFR 35 (L) 10/02/2022   GFRNONAA 44 (L) 08/23/2022      Latest Ref Rng & Units 10/02/2022   11:22 AM 08/23/2022   11:28 AM 05/14/2022    2:47 PM  BMP  Glucose 70 - 99 mg/dL 86  086  578   BUN 8 - 27 mg/dL 24  23  17    Creatinine 0.76 - 1.27 mg/dL 4.69  6.29  5.28   BUN/Creat Ratio 10 - 24 12     Sodium 134 - 144 mmol/L 138  138  140   Potassium 3.5 - 5.2 mmol/L 4.0  3.6  4.0   Chloride 96 - 106 mmol/L 100  100  103   CO2 20 - 29 mmol/L 23  25  30    Calcium 8.6 - 10.2 mg/dL 8.6  7.8  9.6       Latest Ref Rng & Units 08/23/2022   11:28 AM 05/14/2022    2:47 PM 09/21/2021   10:55 AM  CBC  WBC 4.0 - 10.5 K/uL 11.2  7.3  6.5   Hemoglobin 13.0 - 17.0 g/dL 41.3  24.4  01.0   Hematocrit 39.0 - 52.0 % 46.0  48.4  41.3   Platelets 150 - 400 K/uL 198  219  179    Lab Results  Component Value Date   HGBA1C 6.0 (H) 03/18/2019    No results found for: "TSH"  Review of Systems  Cardiovascular:  Positive for dyspnea on exertion (chronic). Negative for chest pain and leg swelling.   Physical Exam:   VS:  BP 118/84 (BP Location: Left Arm, Patient Position: Sitting, Cuff Size: Large)   Pulse 89   Resp 16   Ht 5\' 11"  (1.803 m)   Wt 256 lb 9.6 oz (116.4 kg)   SpO2 98%   BMI 35.79 kg/m    Wt Readings from Last 3 Encounters:  09/02/23 256 lb 9.6 oz (116.4 kg)  01/15/23 255 lb 6.4 oz (115.8 kg)  10/16/22 249 lb (112.9 kg)    Physical Exam Neck:     Vascular: No carotid bruit or JVD.  Cardiovascular:     Rate and Rhythm: Normal rate. Rhythm irregular.     Pulses: Intact distal pulses.     Heart sounds: Normal heart sounds. No murmur heard.    No gallop.  Pulmonary:     Effort: Pulmonary effort is normal.     Breath sounds: Normal breath sounds.  Abdominal:     General: Bowel sounds are normal.     Palpations: Abdomen is soft.   Musculoskeletal:     Right lower leg: Edema (1-2+ pitting below knee) present.     Left lower leg: Edema (1-2+ pitting below knee) present.    Studies Reviewed: .    Echocardiogram 12/30/2022: Left ventricle cavity is normal in size. Normal left ventricular wall thickness. Moderate global hypokinesis. LVEF 35-40%. Unable to evaluate diastolic function due to atrial fibrillation.  Left atrial cavity is severely dilated at 49.3 ml/m^2. Trileaflet aortic valve. Mild aortic valve leaflet calcification. Trace aortic regurgitation. Mild calcification of the mitral valve annulus. Mild to moderate mitral regurgitation. Mild tricuspid regurgitation No evidence of pulmonary hypertension. Previous study on 05/10/2023 reported mild LA dilatation, moderate TR. EKG:         EKG 01/15/2023: Atrial fibrillation with controlled ventricular response with rate of 91 bpm, normal axis, poor R progression, cannot exclude anteroseptal infarct old. Frequent PVCs in bigeminal pattern. Nonspecific T abnormality.   Medications and allergies    Allergies  Allergen Reactions   Farxiga [Dapagliflozin] Other (See Comments)    Abdominal cramps     Current Outpatient Medications:    allopurinol (ZYLOPRIM) 300 MG tablet, Take 300 mg by mouth daily., Disp: , Rfl:    atorvastatin (LIPITOR) 20 MG tablet, TAKE 1 TABLET BY MOUTH ONCE DAILY AT  6PM, Disp: 90 tablet, Rfl: 1   carvedilol (COREG) 25 MG tablet, TAKE 1 AND 1/2 TABLETS BY MOUTH  TWICE DAILY, Disp: 300 tablet, Rfl: 2   furosemide (LASIX) 20 MG tablet, TAKE 1 TABLET BY MOUTH ONCE DAILY IN THE MORNING, Disp: 90 tablet, Rfl: 3   glipiZIDE (GLUCOTROL) 5 MG tablet, Take 5 mg by mouth daily before breakfast., Disp: , Rfl:    hydrALAZINE (APRESOLINE) 100 MG tablet, Take 100 mg by mouth 2 (two) times daily. , Disp: , Rfl:    ONETOUCH VERIO test strip, 1 each daily., Disp: , Rfl:    spironolactone (ALDACTONE) 25 MG tablet, TAKE 1 TABLET BY MOUTH ONCE DAILY IN THE  MORNING, Disp: 90 tablet, Rfl: 3   Tamsulosin HCl (FLOMAX) 0.4 MG CAPS, Take 0.4 mg by mouth daily., Disp: , Rfl:    valsartan (DIOVAN) 80 MG tablet, Take 1 tablet (80 mg total) by mouth every evening., Disp: 90 tablet, Rfl: 3   warfarin (COUMADIN) 2.5 MG tablet, Take 1 tablet by mouth on Sunday, Tuesday, Wednesday,  Friday or as directed by Anticoagulation Clinic, Disp: 65 tablet, Rfl: 0   warfarin (COUMADIN) 3 MG tablet, Take 1 tablet by mouth on Monday, Thursday, Saturday or as directed by Anticoagulation Clinic., Disp: 45 tablet, Rfl: 0   ASSESSMENT AND PLAN: .      ICD-10-CM   1. Permanent atrial fibrillation (HCC)  I48.21     2. Coronary artery disease involving native coronary artery of native heart with other form of angina pectoris (HCC)  I25.118     3. 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 He reports no leg swelling and experiences exertional dyspnea, consistent with HFpEF. Advised to report leg swelling, significant dyspnea, or rapid weight gain. Continue carvedilol 25 mg twice a day, hydralazine 100 mg twice a day, spironolactone 25 mg in the morning, and valsartan 80 mg in the evening. Order repeat echocardiogram in six months.  Prior to next office visit.  Atrial fibrillation   Managed with warfarin to prevent thromboembolic events. No recent episodes or complications reported. Continue warfarin therapy.  Heart rate is well-controlled on carvedilol.  Hypertension   Hypertension is well-controlled with carvedilol, spironolactone, hydralazine and valsartan. Advised to maintain a careful diet for blood pressure management. Continue current antihypertensive regimen.  Follow-up   Scheduled for cataract procedure next Wednesday. Plans to change  primary care provider post-procedure. Follow-up cardiology appointment in six months with repeat echocardiogram.           Signed,  Yates Decamp, MD, [facility] 09/02/2023, 12:09 PM [facility] [address] [place], [place] [address] Phone: [phone]. Fax:  [phone]

## 2023-09-02 NOTE — Patient Instructions (Signed)
 Medication Instructions:  Your physician recommends that you continue on your current medications as directed. Please refer to the Current Medication list given to you today.  *If you need a refill on your cardiac medications before your next appointment, please call your pharmacy*   Lab Work: none If you have labs (blood work) drawn today and your tests are completely normal, you will receive your results only by: MyChart Message (if you have MyChart) OR A paper copy in the mail If you have any lab test that is abnormal or we need to change your treatment, we will call you to review the results.   Testing/Procedures: Your physician has requested that you have an echocardiogram in 6 months. . Echocardiography is a painless test that uses sound waves to create images of your heart. It provides your doctor with information about the size and shape of your heart and how well your heart's chambers and valves are working. This procedure takes approximately one hour. There are no restrictions for this procedure. Please do NOT wear cologne, perfume, aftershave, or lotions (deodorant is allowed). Please arrive 15 minutes prior to your appointment time.  Please note: We ask at that you not bring children with you during ultrasound (echo/ vascular) testing. Due to room size and safety concerns, children are not allowed in the ultrasound rooms during exams. Our front office staff cannot provide observation of children in our lobby area while testing is being conducted. An adult accompanying a patient to their appointment will only be allowed in the ultrasound room at the discretion of the ultrasound technician under special circumstances. We apologize for any inconvenience.    Follow-Up: At [facility], you and your health needs are our priority.  As part of our continuing mission to provide you with exceptional heart care, we have created designated Provider Care Teams.  These Care Teams  include your primary Cardiologist (physician) and Advanced Practice Providers (APPs -  Physician Assistants and Nurse Practitioners) who all work together to provide you with the care you need, when you need it.  We recommend signing up for the patient portal called "MyChart".  Sign up information is provided on this After Visit Summary.  MyChart is used to connect with patients for Virtual Visits (Telemedicine).  Patients are able to view lab/test results, encounter notes, upcoming appointments, etc.  Non-urgent messages can be sent to your provider as well.   To learn more about what you can do with MyChart, go to [email or web address].    Your next appointment:   6 month(s) after echo  Provider:   Yates Decamp, MD     Other Instructions

## 2023-09-30 ENCOUNTER — Ambulatory Visit: Attending: Cardiology

## 2023-09-30 DIAGNOSIS — I4821 Permanent atrial fibrillation: Secondary | ICD-10-CM | POA: Diagnosis not present

## 2023-09-30 DIAGNOSIS — Z5181 Encounter for therapeutic drug level monitoring: Secondary | ICD-10-CM

## 2023-09-30 LAB — POCT INR: INR: 2.6 (ref 2.0–3.0)

## 2023-09-30 NOTE — Patient Instructions (Signed)
 CONTINUE 3mg  ON MONDAY, THURSDAY and SATURDAY and 2.5 mg ON SUNDAY, TUESDAY, WEDNESDAY and FRIDAY.  INR in 6 weeks. [phone].

## 2023-10-14 LAB — LAB REPORT - SCANNED
Albumin, Urine POC: 16.2
Creatinine, POC: 179.2 mg/dL
EGFR: 40
Microalb Creat Ratio: 9

## 2023-10-27 ENCOUNTER — Other Ambulatory Visit: Payer: Self-pay | Admitting: Cardiology

## 2023-10-27 DIAGNOSIS — I10 Essential (primary) hypertension: Secondary | ICD-10-CM

## 2023-11-03 ENCOUNTER — Other Ambulatory Visit: Payer: Self-pay | Admitting: Cardiology

## 2023-11-03 DIAGNOSIS — Z5181 Encounter for therapeutic drug level monitoring: Secondary | ICD-10-CM

## 2023-11-03 DIAGNOSIS — I4821 Permanent atrial fibrillation: Secondary | ICD-10-CM

## 2023-11-03 NOTE — Telephone Encounter (Signed)
 Refill request for warfarin:  Last INR was 2.6 on 09/30/23 Next INR due 11/11/23 LOV on 09/02/23  Refill approved.

## 2023-11-05 ENCOUNTER — Other Ambulatory Visit: Payer: Self-pay

## 2023-11-05 DIAGNOSIS — Z7901 Long term (current) use of anticoagulants: Secondary | ICD-10-CM

## 2023-11-05 DIAGNOSIS — I4821 Permanent atrial fibrillation: Secondary | ICD-10-CM

## 2023-11-05 MED ORDER — WARFARIN SODIUM 2.5 MG PO TABS: ORAL_TABLET | ORAL | 0 refills | Status: DC

## 2023-11-11 ENCOUNTER — Ambulatory Visit: Attending: Cardiology | Admitting: *Deleted

## 2023-11-11 DIAGNOSIS — Z5181 Encounter for therapeutic drug level monitoring: Secondary | ICD-10-CM

## 2023-11-11 DIAGNOSIS — I4821 Permanent atrial fibrillation: Secondary | ICD-10-CM

## 2023-11-11 LAB — POCT INR: INR: 1.6 — AB (ref 2.0–3.0)

## 2023-11-11 NOTE — Patient Instructions (Signed)
 Description   TODAY TAKE 3mg  OF WARFARIN THEN CONTINUE 3mg  ON MONDAY, THURSDAY and SATURDAY and 2.5 mg ON SUNDAY, TUESDAY, WEDNESDAY and FRIDAY. Recheck INR in 5 weeks. [phone].

## 2023-12-16 ENCOUNTER — Ambulatory Visit

## 2023-12-17 ENCOUNTER — Ambulatory Visit: Attending: Cardiology

## 2023-12-17 DIAGNOSIS — I4821 Permanent atrial fibrillation: Secondary | ICD-10-CM

## 2023-12-17 LAB — POCT INR: INR: 2.3 (ref 2.0–3.0)

## 2023-12-17 NOTE — Patient Instructions (Signed)
 Description   CONTINUE 3mg  ON MONDAY, THURSDAY and SATURDAY and 2.5 mg ON SUNDAY, TUESDAY, WEDNESDAY and FRIDAY.  Recheck INR in 6 weeks.  Coumadin  Clinic [phone]

## 2023-12-17 NOTE — Progress Notes (Signed)
Please see anticoagulation encounter.

## 2024-01-28 ENCOUNTER — Ambulatory Visit: Attending: Cardiology | Admitting: *Deleted

## 2024-01-28 DIAGNOSIS — I4821 Permanent atrial fibrillation: Secondary | ICD-10-CM

## 2024-01-28 DIAGNOSIS — Z5181 Encounter for therapeutic drug level monitoring: Secondary | ICD-10-CM | POA: Diagnosis not present

## 2024-01-28 LAB — POCT INR: INR: 3 (ref 2.0–3.0)

## 2024-01-28 NOTE — Progress Notes (Signed)
 INR 3.0; Please see anticoagulation encounter

## 2024-01-28 NOTE — Patient Instructions (Signed)
 Description   CONTINUE 3mg  ON MONDAY, THURSDAY and SATURDAY and 2.5 mg ON SUNDAY, TUESDAY, WEDNESDAY and FRIDAY.  Recheck INR in 6 weeks.  Coumadin  Clinic [phone]

## 2024-03-02 ENCOUNTER — Other Ambulatory Visit: Payer: Self-pay | Admitting: Cardiology

## 2024-03-02 DIAGNOSIS — Z5181 Encounter for therapeutic drug level monitoring: Secondary | ICD-10-CM

## 2024-03-02 DIAGNOSIS — I4821 Permanent atrial fibrillation: Secondary | ICD-10-CM

## 2024-03-02 NOTE — Telephone Encounter (Signed)
 Warfarin 2.5mg  refill Afib Last INR  01/28/24 Last OV 09/02/23

## 2024-03-04 ENCOUNTER — Ambulatory Visit: Payer: Self-pay | Admitting: Cardiology

## 2024-03-04 ENCOUNTER — Ambulatory Visit (HOSPITAL_COMMUNITY)
Admission: RE | Admit: 2024-03-04 | Discharge: 2024-03-04 | Disposition: A | Discharge status: Home / Self Care | Source: Ambulatory Visit | Attending: Cardiology | Admitting: Cardiology

## 2024-03-04 DIAGNOSIS — I5042 Chronic combined systolic (congestive) and diastolic (congestive) heart failure: Secondary | ICD-10-CM | POA: Diagnosis present

## 2024-03-04 DIAGNOSIS — I4821 Permanent atrial fibrillation: Secondary | ICD-10-CM | POA: Insufficient documentation

## 2024-03-04 DIAGNOSIS — I25118 Atherosclerotic heart disease of native coronary artery with other forms of angina pectoris: Secondary | ICD-10-CM | POA: Insufficient documentation

## 2024-03-04 LAB — ECHOCARDIOGRAM COMPLETE
Area-P 1/2: 4.3 cm2
P 1/2 time: 533 ms
S' Lateral: 3.8 cm

## 2024-03-09 ENCOUNTER — Ambulatory Visit: Attending: Cardiology | Admitting: Pharmacist

## 2024-03-09 DIAGNOSIS — Z7901 Long term (current) use of anticoagulants: Secondary | ICD-10-CM | POA: Insufficient documentation

## 2024-03-09 DIAGNOSIS — I4821 Permanent atrial fibrillation: Secondary | ICD-10-CM

## 2024-03-09 LAB — POCT INR: INR: 3.1 — AB (ref 2.0–3.0)

## 2024-03-09 NOTE — Patient Instructions (Addendum)
 Description   INR 3.1: Hold dose today and then CONTINUE 3mg  ON MONDAY, THURSDAY and SATURDAY and 2.5 mg ON SUNDAY, TUESDAY, WEDNESDAY and FRIDAY.  Recheck INR in 6 weeks.  Coumadin  Clinic [phone]

## 2024-03-09 NOTE — Progress Notes (Signed)
 Description   INR 3.1: Hold dose today and then CONTINUE 3mg  ON MONDAY, THURSDAY and SATURDAY and 2.5 mg ON SUNDAY, TUESDAY, WEDNESDAY and FRIDAY.  Recheck INR in 6 weeks.  Coumadin  Clinic [phone]

## 2024-03-17 ENCOUNTER — Ambulatory Visit: Attending: Cardiology | Admitting: Cardiology

## 2024-03-17 ENCOUNTER — Encounter: Payer: Self-pay | Admitting: Cardiology

## 2024-03-17 VITALS — BP 116/79 | HR 67 | Resp 16 | Ht 71.0 in | Wt 264.0 lb

## 2024-03-17 DIAGNOSIS — I4821 Permanent atrial fibrillation: Secondary | ICD-10-CM | POA: Diagnosis not present

## 2024-03-17 DIAGNOSIS — I129 Hypertensive chronic kidney disease with stage 1 through stage 4 chronic kidney disease, or unspecified chronic kidney disease: Secondary | ICD-10-CM | POA: Diagnosis not present

## 2024-03-17 DIAGNOSIS — I5032 Chronic diastolic (congestive) heart failure: Secondary | ICD-10-CM

## 2024-03-17 DIAGNOSIS — N184 Chronic kidney disease, stage 4 (severe): Secondary | ICD-10-CM

## 2024-03-17 DIAGNOSIS — I25118 Atherosclerotic heart disease of native coronary artery with other forms of angina pectoris: Secondary | ICD-10-CM | POA: Diagnosis not present

## 2024-03-17 NOTE — Patient Instructions (Signed)
 Medication Instructions:  No changes    Lab Work: Not needed   Testing/Procedures:  Not needed  Follow-Up: At [facility], you and your health needs are our priority.  As part of our continuing mission to provide you with exceptional heart care, we have created designated Provider Care Teams.  These Care Teams include your primary Cardiologist (physician) and Advanced Practice Providers (APPs -  Physician Assistants and Nurse Practitioners) who all work together to provide you with the care you need, when you need it.     Your next appointment:   12 month(s)  The format for your next appointment:   In Person  Provider:   Gordy Bergamo, MD

## 2024-03-17 NOTE — Progress Notes (Signed)
 Cardiology Office Note:  .   Date:  03/17/2024  ID:  Gregory [name], DOB 1946-02-01, MRN [id number] PCP: Gregory Prentice DELENA Mickey., FNP  [facility] Providers Cardiologist:  Gordy Bergamo, MD   History of Present Illness: .   Gregory [name] is a 78 y.o. 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  In July 2024, I recommended repeating echocardiogram which was done on 03/04/2024 revealing normalization of LVEF from 35 to 40% noted on 12/30/2022.  This is a 23-month office visit.  He remains asymptomatic.  Cardiac Studies relevent.    ECHOCARDIOGRAM COMPLETE 03/04/2024  1. Left ventricular ejection fraction, by estimation, is 55 to 60%. The left ventricle has normal function. The left ventricle demonstrates regional wall motion abnormalities (see scoring diagram/findings for description). There is severe asymmetric left ventricular hypertrophy of the basal-septal segment. Left ventricular diastolic parameters are indeterminate. 2. Right ventricular systolic function is low normal. The right ventricular size is normal. There is mildly elevated pulmonary artery systolic pressure. The estimated right ventricular systolic pressure is 43.7 mmHg. 3. Left atrial size was moderately dilated. 4.  Compared to 12/30/2022, LVEF has improved from 35 to 40%.  MYOCARDIAL AMYLOID PLANAR AND SPECT 04/29/2022    By semi-quantitative assessment scan is consistent with increased heart uptake but less than rib uptake-Grade 1. Heart to contralateral lung ratio is between 1-1.5, indeterminate for amyloid.   Study is equivocal for TTR amyloidosis (visual score of 1/ratio between 1-1.5).     Discussed the use of AI scribe software for clinical note transcription with the patient, who gave verbal consent  to proceed.  History of Present Illness Gregory [name] is a 78 year old male with atrial fibrillation and coronary artery disease who presents for cardiovascular follow-up.  His heart function has improved significantly, with a recent echocardiogram showing an ejection fraction of 55-60%, up from 35-40%. He has not experienced any heart-related problems recently and remains active without changes to his lifestyle. His blood pressure is well-controlled, and diabetes is stable, with a recent hemoglobin A1c of 5.6%.  He is currently taking carvedilol  25 mg, one and a half tablets twice a day, hydralazine  100 mg twice a day, spironolactone  25 mg once a day, and valsartan  80 mg every evening. He is on warfarin for atrial fibrillation, and his heart rate is well-controlled. His warfarin is monitored by Prent[facility] Gregory at [facility]. No symptoms of bleeding, such as black stools, are present.  He has a history of coronary artery disease, with an angioplasty performed in 2013. He reports no issues since then.   Labs    ROS  Review of Systems  Cardiovascular:  Negative for chest pain, dyspnea on exertion and leg swelling.   Physical Exam:   VS:  BP 116/79 (BP Location: Left Arm, Patient Position: Sitting, Cuff Size: Large)   Pulse 67   Resp 16   Ht 5' 11 (1.803 m)   Wt 264 lb (119.7 kg)   SpO2 96%   BMI 36.82 kg/m    Wt Readings from Last 3 Encounters:  03/17/24 264 lb (119.7 kg)  09/02/23 256 lb 9.6 oz (116.4 kg)  01/15/23 255 lb 6.4 oz (115.8 kg)    BP Readings from Last 3 Encounters:  03/17/24 116/79  09/02/23 118/84  01/15/23 124/68   Physical  Exam Constitutional:      Appearance: He is obese.  Neck:     Vascular: No carotid bruit or JVD.  Cardiovascular:     Rate and Rhythm: Normal rate and regular rhythm.     Pulses: Intact distal pulses.          Popliteal pulses are 1+ on the right side and 1+ on the left side.       Posterior tibial pulses are 1+ on the right side and  1+ on the left side.     Heart sounds: Normal heart sounds. No murmur heard.    No gallop.  Pulmonary:     Effort: Pulmonary effort is normal.     Breath sounds: Normal breath sounds.  Abdominal:     General: Bowel sounds are normal.     Palpations: Abdomen is soft.  Musculoskeletal:     Right lower leg: No edema.     Left lower leg: No edema.    EKG:    EKG Interpretation Date/Time:  Wednesday March 17 2024 13:30:13 EDT Ventricular Rate:  82 PR Interval:    QRS Duration:  76 QT Interval:  396 QTC Calculation: 462 R Axis:   12  Text Interpretation: EKG 03/17/2024: Atrial fibrillation with controlled ventricular sponsor at the rate of 82 bpm, normal axis.  Frequent PVCs (3) including couplet and single PVC.  Compared to 08/23/2022, frequent PVCs in bigeminal pattern not present. Confirmed by Waymond Meador, Jagadeesh ([id number]) on 03/17/2024 1:50:20 PM    ASSESSMENT AND PLAN: .      ICD-10-CM   1. Permanent atrial fibrillation (HCC)  I48.21 EKG 12-Lead    2. Coronary artery disease involving native coronary artery of native heart with other form of angina pectoris  I25.118     3. Heart failure with improved ejection fraction (HFimpEF) (HCC)  I50.32     4. CKD stage 4 secondary to hypertension (HCC)  I12.9    N18.4      Assessment & Plan Atherosclerotic heart disease of native coronary artery with angina Coronary artery disease is well-managed. EKG shows no abnormalities. Echocardiogram from 03/04/2024 indicates improved heart function at 55-60%, up from 35-40% previously.  Permanent atrial fibrillation on chronic anticoagulation Atrial fibrillation is well-controlled. He tolerates warfarin well. Anticoagulation is managed by Prentice Sharps at [facility]. - Recommend blood work twice a year to monitor for bleeding due to anticoagulation therapy.  Hypertensive chronic kidney disease Hypertension is well-controlled with current medication regimen. Kidney function is stable, with last  creatinine level from 09/2023. No signs of bleeding or melena reported. - Continue current antihypertensive medications: carvedilol , hydralazine , spironolactone , valsartan . - Plan blood work twice a year to monitor kidney function and check for bleeding.  Heart failure with improved ejection fraction (HFimpEF) (HCC)  Recent echocardiogram reviewed with the patient.  EF is improved from 35 to 40% to the present 55%.  Continue present medical management. -Continue spironolactone  25 mg daily, valsartan  80 mg in the evening, hydralazine  100 mg p.o. twice daily, carvedilol  37.5 mg p.o. 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 Symptoms are sporadic, with pain occurring at rest and sometimes with movement. He has had hip injections which provide temporary relief. He is not interested in surgical intervention at this time due to past surgical experiences. - Monitor symptoms and manage conservatively. - Doubt significant PAD.   Follow up: 1 year.  Signed,  Gordy Bergamo, MD, [facility] 03/17/2024, 1:50 PM [facility] [address] [place], [place] [address] Phone: [phone]. Fax:  [phone]

## 2024-04-19 ENCOUNTER — Emergency Department (HOSPITAL_COMMUNITY)
Admission: EM | Admit: 2024-04-19 | Discharge: 2024-04-19 | Disposition: A | Discharge status: Home / Self Care | Attending: Emergency Medicine | Admitting: Emergency Medicine

## 2024-04-19 ENCOUNTER — Encounter (HOSPITAL_COMMUNITY): Payer: Self-pay

## 2024-04-19 ENCOUNTER — Other Ambulatory Visit: Payer: Self-pay

## 2024-04-19 ENCOUNTER — Emergency Department (HOSPITAL_COMMUNITY)

## 2024-04-19 DIAGNOSIS — Z7901 Long term (current) use of anticoagulants: Secondary | ICD-10-CM | POA: Insufficient documentation

## 2024-04-19 DIAGNOSIS — Z7984 Long term (current) use of oral hypoglycemic drugs: Secondary | ICD-10-CM | POA: Insufficient documentation

## 2024-04-19 DIAGNOSIS — E119 Type 2 diabetes mellitus without complications: Secondary | ICD-10-CM | POA: Diagnosis not present

## 2024-04-19 DIAGNOSIS — I2510 Atherosclerotic heart disease of native coronary artery without angina pectoris: Secondary | ICD-10-CM | POA: Diagnosis not present

## 2024-04-19 DIAGNOSIS — J449 Chronic obstructive pulmonary disease, unspecified: Secondary | ICD-10-CM | POA: Diagnosis not present

## 2024-04-19 DIAGNOSIS — R0789 Other chest pain: Secondary | ICD-10-CM | POA: Diagnosis present

## 2024-04-19 DIAGNOSIS — I509 Heart failure, unspecified: Secondary | ICD-10-CM | POA: Insufficient documentation

## 2024-04-19 DIAGNOSIS — Z79899 Other long term (current) drug therapy: Secondary | ICD-10-CM | POA: Insufficient documentation

## 2024-04-19 DIAGNOSIS — I110 Hypertensive heart disease with heart failure: Secondary | ICD-10-CM | POA: Insufficient documentation

## 2024-04-19 DIAGNOSIS — E876 Hypokalemia: Secondary | ICD-10-CM | POA: Diagnosis not present

## 2024-04-19 LAB — CBC WITH DIFFERENTIAL/PLATELET
Abs Immature Granulocytes: 0.02 K/uL (ref 0.00–0.07)
Basophils Absolute: 0.1 K/uL (ref 0.0–0.1)
Basophils Relative: 1 %
Eosinophils Absolute: 0.2 K/uL (ref 0.0–0.5)
Eosinophils Relative: 4 %
HCT: 43.2 % (ref 39.0–52.0)
Hemoglobin: 13.9 g/dL (ref 13.0–17.0)
Immature Granulocytes: 0 %
Lymphocytes Relative: 21 %
Lymphs Abs: 1.4 K/uL (ref 0.7–4.0)
MCH: 30.6 pg (ref 26.0–34.0)
MCHC: 32.2 g/dL (ref 30.0–36.0)
MCV: 95.2 fL (ref 80.0–100.0)
Monocytes Absolute: 0.6 K/uL (ref 0.1–1.0)
Monocytes Relative: 9 %
Neutro Abs: 4.6 K/uL (ref 1.7–7.7)
Neutrophils Relative %: 65 %
Platelets: 191 K/uL (ref 150–400)
RBC: 4.54 MIL/uL (ref 4.22–5.81)
RDW: 13.4 % (ref 11.5–15.5)
WBC: 6.9 K/uL (ref 4.0–10.5)
nRBC: 0 % (ref 0.0–0.2)

## 2024-04-19 LAB — COMPREHENSIVE METABOLIC PANEL WITH GFR
ALT: 13 U/L (ref 0–44)
AST: 19 U/L (ref 15–41)
Albumin: 3.1 g/dL — ABNORMAL LOW (ref 3.5–5.0)
Alkaline Phosphatase: 80 U/L (ref 38–126)
Anion gap: 13 (ref 5–15)
BUN: 16 mg/dL (ref 8–23)
CO2: 26 mmol/L (ref 22–32)
Calcium: 8.4 mg/dL — ABNORMAL LOW (ref 8.9–10.3)
Chloride: 102 mmol/L (ref 98–111)
Creatinine, Ser: 1.85 mg/dL — ABNORMAL HIGH (ref 0.61–1.24)
GFR, Estimated: 37 mL/min — ABNORMAL LOW (ref >60)
Glucose, Bld: 107 mg/dL — ABNORMAL HIGH (ref 70–99)
Potassium: 3.2 mmol/L — ABNORMAL LOW (ref 3.5–5.1)
Sodium: 141 mmol/L (ref 135–145)
Total Bilirubin: 0.9 mg/dL (ref 0.0–1.2)
Total Protein: 6.3 g/dL — ABNORMAL LOW (ref 6.5–8.1)

## 2024-04-19 LAB — LIPASE, BLOOD: Lipase: 29 U/L (ref 11–51)

## 2024-04-19 LAB — TROPONIN I (HIGH SENSITIVITY)
Troponin I (High Sensitivity): 16 ng/L (ref <18)
Troponin I (High Sensitivity): 25 ng/L — ABNORMAL HIGH (ref <18)

## 2024-04-19 LAB — PROTIME-INR
INR: 3.6 — ABNORMAL HIGH (ref 0.8–1.2)
Prothrombin Time: 37.6 s — ABNORMAL HIGH (ref 11.4–15.2)

## 2024-04-19 LAB — MAGNESIUM: Magnesium: 1.4 mg/dL — ABNORMAL LOW (ref 1.7–2.4)

## 2024-04-19 LAB — BRAIN NATRIURETIC PEPTIDE: B Natriuretic Peptide: 208.1 pg/mL — ABNORMAL HIGH (ref 0.0–100.0)

## 2024-04-19 MED ORDER — POTASSIUM CHLORIDE CRYS ER 20 MEQ PO TBCR
40.0000 meq | EXTENDED_RELEASE_TABLET | Freq: Once | ORAL | Status: AC
  Administered 2024-04-19: 40 meq via ORAL
  Filled 2024-04-19: qty 2

## 2024-04-19 MED ORDER — MAGNESIUM SULFATE 2 GM/50ML IV SOLN
2.0000 g | Freq: Once | INTRAVENOUS | Status: AC
  Administered 2024-04-19: 2 g via INTRAVENOUS
  Filled 2024-04-19: qty 50

## 2024-04-19 NOTE — ED Provider Notes (Signed)
 [place] EMERGENCY DEPARTMENT AT [facility] Provider Note   CSN: [id number] Arrival date & time: 04/19/24  9155     Patient presents with: Chest Pain   Gregory [name] is a 78 y.o. male.   HPI Patient presents for chest tightness.  Medical history includes CAD, COPD, HLD, CHF, arthritis, HTN.  His cardiologist is Dr. Ladona.  He was last seen in the cardiology office 1 month ago.  He underwent cardiac cath and stenting in 2013.  He underwent cardiac cath in 2020 which showed multivessel disease without significant change from his prior study.  Most recent echocardiogram last month showed normalization of prior reduced EF.  He underwent a myocardial scan in 2023 which was equivocal for TTR amyloidosis.  He has permanent atrial fibrillation and takes warfarin.  4 days ago, he had onset of a left-sided chest tightness.  This has been intermittent since that time.  At times, it will radiate to his left thoracic back.  Overnight, it was occurring more frequently.  Currently, he is asymptomatic.    Prior to Admission medications   Medication Sig Start Date End Date Taking? Authorizing Provider  allopurinol  (ZYLOPRIM ) 300 MG tablet Take 300 mg by mouth daily. 06/09/20   [provider]  atorvastatin  (LIPITOR ) 20 MG tablet TAKE 1 TABLET BY MOUTH ONCE DAILY AT  JEREL 03/22/20   Ladona Heinz, MD  carvedilol  (COREG ) 25 MG tablet TAKE 1 AND 1/2 TABLETS BY MOUTH  TWICE DAILY 12/17/22   Ladona Heinz, MD  furosemide  (LASIX ) 20 MG tablet TAKE 1 TABLET BY MOUTH ONCE DAILY IN THE MORNING 05/06/23   Ladona Heinz, MD  glipiZIDE  (GLUCOTROL ) 5 MG tablet Take 5 mg by mouth daily before breakfast.    [provider]  hydrALAZINE  (APRESOLINE ) 100 MG tablet Take 100 mg by mouth 2 (two) times daily.  10/06/16   [provider]  [facility] VERIO test strip 1 each daily. 09/29/20   [provider]  spironolactone  (ALDACTONE ) 25 MG tablet TAKE 1 TABLET BY MOUTH ONCE DAILY IN THE MORNING  10/27/23   Ladona Heinz, MD  Tamsulosin  HCl (FLOMAX ) 0.4 MG CAPS Take 0.4 mg by mouth daily.    [provider]  valsartan  (DIOVAN ) 80 MG tablet Take 1 tablet (80 mg total) by mouth every evening. 06/25/22   Ganji, Jay, MD  warfarin (COUMADIN ) 2.5 MG tablet TAKE 1  TABLET BY MOUTH ONCE DAILY OR  AS  DIRECTED  BY  ANTICOAGULATION  CLINIC 03/02/24   Ladona Heinz, MD  warfarin (COUMADIN ) 3 MG tablet TAKE 1 TABLET BY MOUTH ON MONDAY, THURSDAY, SATURDAY OR AS DIRECTED BY ANTICOAGULATION CLINIC 11/03/23   Ladona Heinz, MD    Allergies: Farxiga  [dapagliflozin ]    Review of Systems  Respiratory:  Positive for chest tightness.   All other systems reviewed and are negative.   Updated Vital Signs BP 128/79 (BP Location: Right Arm)   Pulse 94   Temp 98 F (36.7 C)   Resp 18   Ht 5' 11 (1.803 m)   Wt 117.9 kg   SpO2 99%   BMI 36.26 kg/m   Physical Exam Vitals and nursing note reviewed.  Constitutional:      General: He is not in acute distress.    Appearance: He is well-developed. He is not ill-appearing, toxic-appearing or diaphoretic.  HENT:     Head: Normocephalic and atraumatic.  Eyes:     Extraocular Movements: Extraocular movements intact.     Conjunctiva/sclera: Conjunctivae normal.  Cardiovascular:     Rate and Rhythm: Normal rate and regular rhythm.     Heart sounds: No murmur heard. Pulmonary:     Effort: Pulmonary effort is normal. No respiratory distress.     Breath sounds: Normal breath sounds. No decreased breath sounds, wheezing, rhonchi or rales.  Chest:     Chest wall: No tenderness.  Abdominal:     Palpations: Abdomen is soft.     Tenderness: There is no abdominal tenderness.  Musculoskeletal:        General: No swelling. Normal range of motion.     Cervical back: Normal range of motion and neck supple.  Skin:    General: Skin is warm and dry.     Coloration: Skin is not cyanotic or pale.  Neurological:     General: No focal deficit present.     Mental Status:  He is alert and oriented to person, place, and time.  Psychiatric:        Mood and Affect: Mood normal.        Behavior: Behavior normal.     (all labs ordered are listed, but only abnormal results are displayed) Labs Reviewed  MAGNESIUM - Abnormal; Notable for the following components:      Result Value   Magnesium 1.4 (*)    All other components within normal limits  COMPREHENSIVE METABOLIC PANEL WITH GFR - Abnormal; Notable for the following components:   Potassium 3.2 (*)    Glucose, Bld 107 (*)    Creatinine, Ser 1.85 (*)    Calcium  8.4 (*)    Total Protein 6.3 (*)    Albumin  3.1 (*)    GFR, Estimated 37 (*)    All other components within normal limits  BRAIN NATRIURETIC PEPTIDE - Abnormal; Notable for the following components:   B Natriuretic Peptide 208.1 (*)    All other components within normal limits  PROTIME-INR - Abnormal; Notable for the following components:   Prothrombin Time 37.6 (*)    INR 3.6 (*)    All other components within normal limits  TROPONIN I (HIGH SENSITIVITY) - Abnormal; Notable for the following components:   Troponin I (High Sensitivity) 25 (*)    All other components within normal limits  LIPASE, BLOOD  CBC WITH DIFFERENTIAL/PLATELET  TROPONIN I (HIGH SENSITIVITY)    EKG: EKG Interpretation Date/Time:  Monday April 19 2024 08:57:01 EDT Ventricular Rate:  99 PR Interval:    QRS Duration:  94 QT Interval:  381 QTC Calculation: 489 R Axis:   22  Text Interpretation: Atrial fibrillation Low voltage, extremity and precordial leads Minimal ST depression, inferior leads Borderline prolonged QT interval Confirmed by Melvenia Motto [phone]) on 04/19/2024 10:13:33 AM  Radiology: [facility] Chest Portable 1 View Result Date: 04/19/2024 EXAM: 1 VIEW XRAY OF THE CHEST 04/19/2024 09:12:00 AM COMPARISON: None available. CLINICAL HISTORY: Chest pain FINDINGS: LUNGS AND PLEURA: Density along the minor fissure could be from atelectasis, scarring, or a trace  amount of fluid. Atelectasis along the right hemidiaphragm. No pulmonary edema. No pneumothorax. HEART AND MEDIASTINUM: Calcified aortic atherosclerosis. No acute abnormality of the cardiac and mediastinal silhouettes. BONES AND SOFT TISSUES: Thoracic spondylosis. Right hemidiaphragm elevation. No acute osseous abnormality. IMPRESSION: 1. Right hemidiaphragm elevation with adjacent subsegmental atelectasis. 2. Linear density along the minor fissure favored to reflect atelectasis or trace fluid. 3. Thoracic spondylosis. 4. Calcified aortic atherosclerosis. Electronically signed by: Jaelah Hauth Salvage MD 04/19/2024 09:43 AM EDT RP Workstation: [id number]     Procedures  Medications Ordered in the ED  magnesium sulfate IVPB 2 g 50 mL (2 g Intravenous New Bag/Given 04/19/24 1240)  potassium chloride  SA (KLOR-CON  M) CR tablet 40 mEq (40 mEq Oral Given 04/19/24 1235)                                    Medical Decision Making Amount and/or Complexity of Data Reviewed Labs: ordered. Radiology: ordered.  Risk Prescription drug management.   This patient presents to the ED for concern of chest tightness, this involves an extensive number of treatment options, and is a complaint that carries with it a high risk of complications and morbidity.  The differential diagnosis includes ACS, GERD, musculoskeletal etiology, arrhythmia, anxiety   Co morbidities / Chronic conditions that complicate the patient evaluation  CAD, COPD, HLD, CHF, arthritis, HTN   Additional history obtained:  Additional history obtained from EMR External records from outside source obtained and reviewed including N/A   Lab Tests:  I Ordered, and personally interpreted labs.  The pertinent results include: Normal hemoglobin, no leukocytosis.  Creatinine is baseline.  Hypokalemia and hypomagnesemia are present.  Troponin is normal-slightly elevated, consistent with prior lab work.  BMP has improved from prior lab  work.   Imaging Studies ordered:  I ordered imaging studies including chest x-ray I independently visualized and interpreted imaging which showed no acute findings I agree with the radiologist interpretation   Cardiac Monitoring: / EKG:  The patient was maintained on a cardiac monitor.  I personally viewed and interpreted the cardiac monitored which showed an underlying rhythm of: Atrial fibrillation   Problem List / ED Course / Critical interventions / Medication management  Patient presenting for intermittent left-sided chest tightness for past 4 days.  Frequency increased overnight.  On arrival in the ED, he is asymptomatic.  He is overall well-appearing on exam.  Breathing is unlabored.  Lungs are clear to auscultation.  EKG shows atrial fibrillation without any concerning ST segment abnormalities.  Workup was initiated.  Lab work was notable for hypokalemia and hypomagnesemia.  Replacement electrolytes were ordered.  Troponins were 16 -->25, consistent with prior lab work.  I spoke with cardiologist on-call, Dr. Vernice, who feels that patient can be discharged with outpatient follow-up.  He will assist in arranging for follow-up appointment.  Cardiology referral was ordered as well.  Patient was discharged in stable condition. I ordered medication including potassium chloride  for hypokalemia, magnesium sulfate for hypomagnesemia Reevaluation of the patient after these medicines showed that the patient stayed the same I have reviewed the patients home medicines and have made adjustments as needed   Consultations Obtained:  I requested consultation with the cardiologist, Dr. Vernice,  and discussed lab and imaging findings as well as pertinent plan - they recommend: Outpatient follow-up   Social Determinants of Health:  Lives independently     Final diagnoses:  Chest tightness  Hypokalemia  Hypomagnesemia    ED Discharge Orders          Ordered    Ambulatory referral to  Cardiology       Comments: If you have not heard from the Cardiology office within the next 72 hours please call [phone].   04/19/24 1306               Melvenia Motto, MD 04/19/24 [phone]

## 2024-04-19 NOTE — ED Triage Notes (Signed)
 Patient states his chest pain was different and got worse when he went to bed last night. He states he first noticed the chest pain last Thursday. Pain in in in left chest and feels tight.   PMH: a-fib, DM2, stents in heart, previous heart attack.

## 2024-04-19 NOTE — Progress Notes (Deleted)
**Note Gregory-Identified via Obfuscation**   Cardiology Office Note   Date:  04/19/2024  ID:  Gregory [name], DOB [birth year], MRN [id number] PCP: Gregory [name] DELENA [name]., FNP  [facility] Providers Cardiologist:  Gregory Bergamo, MD { Click to update primary MD,subspecialty MD or APP then REFRESH:1}    History of Present Illness Gregory [name] is a 78 y.o. male with a past medical history of CKD stage 3-4, HTN, diabetes, CAD, HLD, permanent atrial fibrillation, chronic hypokalemia, chronic combined systolic and diastolic heart failure  Patient previously had stenting to mid D1 and circumflex in 2013. Later, echocardiogram in 02/2019 showed EF 40-45%, grade I DD, mild TR. Left heart catheterization showed patent mid LCX overlapping stents with 10-20% late lumen loss, patent proximal Diag 2 stent without ISR. There were 100% occlusion of the mid RCA with left-to-right collaterals. Also 40% mid LAD, ostial diag 2 80%.   Myocardial imaging for amyloid in 04/2022 was equivocal for TTR amyloidosis. However, a previous bone marrow biopsy did not mention amyloid. Overall had a low suspicion for amyloid heart disease.   Echocardiogram in 12/2022 showed EF 35-40%, mild-moderate MR, mild TR.   Most recent echocardiogram from 03/04/24 showed EF 55-60% with regional wall motion abnormalities, low normal RV systolic function, mild MR.    CAD  - Most recent cath in 02/2019 showed patent mid LCX overlapping stents with 10-20% late lumen loss, patent proximal Diag 2 stent without ISR. There were 100% occlusion of the mid RCA with left-to-right collaterals. Also 40% mid LAD, ostial diag 2 80% - Seen in the ED on 10/27 with chest pain. 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 Most recent echo from 03/04/24 showed EF 55-60% with regional wall motion abnormalities, low normal RV systolic function, mild MR -  - Continue  carvedilol  37.5 mg BID, hydralazine  100 mg BID, spironolactone  25 mg daily, valsartan  80 mg daily  - Creatine stable at 1.85, K 3.2 in the ED on 10/27 - Ordered repeat BMP today    HTN   Permanent atrial fibrillation  -  - On coumadin - managed by PCP through [facility]   ROS: ***  Studies Reviewed      *** Risk Assessment/Calculations {Does this patient have ATRIAL FIBRILLATION?:[id number]}         Physical Exam VS:  There were no vitals taken for this visit.       Wt Readings from Last 3 Encounters:  04/19/24 260 lb (117.9 kg)  03/17/24 264 lb (119.7 kg)  09/02/23 256 lb 9.6 oz (116.4 kg)    GEN: Well nourished, well developed in no acute distress NECK: No JVD; No carotid bruits CARDIAC: ***RRR, no murmurs, rubs, gallops RESPIRATORY:  Clear to auscultation without rales, wheezing or rhonchi  ABDOMEN: Soft, non-tender, non-distended EXTREMITIES:  No edema; No deformity   ASSESSMENT AND PLAN ***    {Are you ordering a CV Procedure (e.g. stress test, cath, DCCV, TEE, etc)?   Press F2        :[id number]}  Dispo: ***  Signed, Gregory FABIENE Louder, PA-C

## 2024-04-19 NOTE — Discharge Instructions (Signed)
 Your test results today were reassuring.  Your potassium and magnesium were low but have been replaced.  You should hear from the cardiology office in the next few days to set up a follow-up appointment.  If you do not hear from them, call the telephone number below.  Return to the emergency department for any new or worsening symptoms of concern.

## 2024-04-20 ENCOUNTER — Ambulatory Visit (INDEPENDENT_AMBULATORY_CARE_PROVIDER_SITE_OTHER): Admitting: *Deleted

## 2024-04-20 ENCOUNTER — Ambulatory Visit

## 2024-04-20 DIAGNOSIS — Z7901 Long term (current) use of anticoagulants: Secondary | ICD-10-CM

## 2024-04-20 NOTE — Patient Instructions (Signed)
 Description   INR 3.6-from ER visit yesterday; Spoke with patient and advised not to take any warfarin tonight then continue taking warfarin 3mg  ON MONDAY, THURSDAY and SATURDAY and 2.5 mg ON SUNDAY, TUESDAY, WEDNESDAY and FRIDAY.  Recheck INR in 3 weeks (normally 6 weeks).  Coumadin  Clinic [phone]

## 2024-04-20 NOTE — Progress Notes (Signed)
 Description   INR 3.6-from ER visit yesterday; Spoke with patient and advised not to take any warfarin tonight then continue taking warfarin 3mg  ON MONDAY, THURSDAY and SATURDAY and 2.5 mg ON SUNDAY, TUESDAY, WEDNESDAY and FRIDAY.  Recheck INR in 3 weeks (normally 6 weeks).  Coumadin  Clinic [phone]

## 2024-04-27 ENCOUNTER — Ambulatory Visit: Admitting: Cardiology

## 2024-05-05 ENCOUNTER — Telehealth: Payer: Self-pay | Admitting: Cardiology

## 2024-05-06 NOTE — Progress Notes (Unsigned)
 Cardiology Office Note   Date:  05/07/2024  ID:  Gregory [name], DOB Sep 12, 1945, MRN [id number] PCP: Claudene Prentice DELENA Mickey., FNP  [facility] Providers Cardiologist:  Gordy Bergamo, MD     History of Present Illness Gregory [name] is a 78 y.o. male with a past medical history of CAD s/p DES x 2 and CTO of mid RCA, hypertension, CKD stage 3-4, DM2, HLD, permanent atrial fibrillation on Coumadin , chronic hypokalemia, chronic combined systolic and diastolic heart failure.  03/04/2024 echo EF 55 to 60%, positive RWMA, severe asymmetric LVH of the basal septal segment, mildly elevated PASP, LA moderately dilated, mild MR, aortic valve sclerosis present without stenosis 01/04/2023 echo EF 35 to 40%, moderate global hypokinesis, mild to moderate MR 04/29/2022 PYP low risk for amyloid 03/19/2019 cardiac cath previously placed second diagonal stent widely patent, previously placed left circumflex stent 20% stenosed, known CTO of proximal RCA to mid RCA with collaterals from distal LAD 03/19/2019 echo EF 40 to 45%  Patient previously had stenting to mid D1 and circumflex in 2013. Later, echocardiogram in 02/2019 showed EF 40-45%, grade I DD, mild TR. Left heart catheterization showed patent mid LCX overlapping stents with 10-20% late lumen loss, patent proximal Diag 2 stent without ISR. There were 100% occlusion of the mid RCA with left-to-right collaterals. Also 40% mid LAD, ostial diag 2 80%.   Myocardial imaging for amyloid in 04/2022 was equivocal for TTR amyloidosis. However, a previous bone marrow biopsy did not mention amyloid. Overall had a low suspicion for amyloid heart disease.   Most recently evaluated by Dr. Bergamo on 09/02/2023, doing well, some mild chronic dyspnea but feeling better than he had in some time, rate was controlled, repeat echo was arranged and completed on 03/04/2024 revealing EF 55-60% with regional wall motion abnormalities, low normal RV systolic function, mild MR.   Recently  evaluated in the emergency department on 04/19/2024, he had been having episodic chest pain for few days, his magnesium and potassium were low and these were replaced, troponin 16>> 25, on-call cardiologist was consulted with plans to follow-up outpatient and he was subsequently discharged.  He presents today for follow-up after hospitalization as outlined above, he has not had any recurrent episodes, feeling well overall, he is relatively sedentary but does perform his chores around his house, lives alone with his wife, has 3 grown sons that are involved in his care and multiple granddaughters. He denies chest pain, palpitations, dyspnea, pnd, orthopnea, n, v, dizziness, syncope, edema, weight gain, or early satiety.   ROS: Review of Systems  All other systems reviewed and are negative.    Studies Reviewed      Cardiac Studies & Procedures   ______________________________________________________________________________________________ CARDIAC CATHETERIZATION  CARDIAC CATHETERIZATION 03/19/2019  Conclusion LM: Normal. LAD: Mid LAD focal 40% stenosis. Unchanged compared to 2013 angiography. Ostial diag 2 80% stenosis with TIMI III flow. Unchanged compared to 2013 angiography. Patent prox Diag 2 stent without ISR. LCx:: Patent mid LCX overlapping stents with 10-20% late lumen loss. RCA: Mid 100% occlusion with grade 3 left-to-right collaterals. Unchanged compared to 2013 angiography.  Findings Coronary Findings Diagnostic  Dominance: Right  Left Anterior Descending Mid LAD lesion is 40% stenosed.  First Stage Manager 2nd Diag-1 lesion is 80% stenosed. Previously placed 2nd Diag-2 stent (unknown type) is widely patent. XIence 2.5 X 15 mm DES.  Left Circumflex Prox Cx to Dist Cx lesion is 20% stenosed. The lesion was previously treated using a drug  eluting stent over 2 years ago. Mid LCx 3.5X18 & 3.5X12 mm Zomaxx DES  Right Coronary  Artery Collaterals Dist RCA filled by collaterals from Dist LAD.  Prox RCA to Mid RCA lesion is 100% stenosed.  Intervention  No interventions have been documented.     ECHOCARDIOGRAM  ECHOCARDIOGRAM COMPLETE 03/04/2024  Narrative ECHOCARDIOGRAM REPORT    Patient Name:   Gregory [name] Date of Exam: 03/04/2024 Medical Rec #:  [id number]     Height:       71.0 in Accession #:    [id number]    Weight:       256.6 lb Date of Birth:  1945/07/22      BSA:          2.344 m Patient Age:    78 years      BP:           118/84 mmHg Patient Gender: M             HR:           77 bpm. Exam Location:  [address]  Procedure: 2D Echo, Cardiac Doppler and Color Doppler (Both Spectral and Color Flow Doppler were utilized during procedure).  Indications:    I48.91 Atrial fibrillation I25.10 CAD  History:        Patient has prior history of Echocardiogram examinations, most recent 12/30/2022. CHF, CAD, Arrythmias:Atrial Fibrillation; Risk Factors:Hypertension, Diabetes, Dyslipidemia and Obesity.  Sonographer:    Elsie Bohr RDCS Referring Phys: [id number] GORDY BERGAMO  IMPRESSIONS   1. Left ventricular ejection fraction, by estimation, is 55 to 60%. The left ventricle has normal function. The left ventricle demonstrates regional wall motion abnormalities (see scoring diagram/findings for description). There is severe asymmetric left ventricular hypertrophy of the basal-septal segment. Left ventricular diastolic parameters are indeterminate. 2. Right ventricular systolic function is low normal. The right ventricular size is normal. There is mildly elevated pulmonary artery systolic pressure. The estimated right ventricular systolic pressure is 43.7 mmHg. 3. Left atrial size was moderately dilated. 4. The mitral valve is grossly normal. Mild mitral valve regurgitation. No evidence of mitral stenosis. 5. The aortic valve is grossly normal. Aortic valve regurgitation is trivial. Aortic valve  sclerosis is present, with no evidence of aortic valve stenosis. 6. The inferior vena cava is normal in size with greater than 50% respiratory variability, suggesting right atrial pressure of 3 mmHg.  Comparison(s): The left ventricular function has improved.  FINDINGS Left Ventricle: Left ventricular ejection fraction, by estimation, is 55 to 60%. The left ventricle has normal function. The left ventricle demonstrates regional wall motion abnormalities. The left ventricular internal cavity size was normal in size. There is severe asymmetric left ventricular hypertrophy of the basal-septal segment. Left ventricular diastolic parameters are indeterminate.   LV Wall Scoring: The basal inferior segment is hypokinetic.  Right Ventricle: The right ventricular size is normal. No increase in right ventricular wall thickness. Right ventricular systolic function is low normal. There is mildly elevated pulmonary artery systolic pressure. The tricuspid regurgitant velocity is 3.19 m/s, and with an assumed right atrial pressure of 3 mmHg, the estimated right ventricular systolic pressure is 43.7 mmHg.  Left Atrium: Left atrial size was moderately dilated.  Right Atrium: Right atrial size was normal in size.  Pericardium: There is no evidence of pericardial effusion. Presence of epicardial fat layer.  Mitral Valve: The mitral valve is grossly normal. Mild mitral valve regurgitation. No evidence of mitral valve stenosis.  Tricuspid Valve: The tricuspid valve  is grossly normal. Tricuspid valve regurgitation is trivial. No evidence of tricuspid stenosis.  Aortic Valve: The aortic valve is grossly normal. Aortic valve regurgitation is trivial. Aortic regurgitation PHT measures 533 msec. Aortic valve sclerosis is present, with no evidence of aortic valve stenosis.  Pulmonic Valve: The pulmonic valve was grossly normal. Pulmonic valve regurgitation is trivial. No evidence of pulmonic stenosis.  Aorta: The  aortic root and ascending aorta are structurally normal, with no evidence of dilitation.  Venous: The inferior vena cava is normal in size with greater than 50% respiratory variability, suggesting right atrial pressure of 3 mmHg.  IAS/Shunts: The atrial septum is grossly normal.   LEFT VENTRICLE PLAX 2D LVIDd:         4.70 cm   Diastology LVIDs:         3.80 cm   LV e' medial:    8.85 cm/s LV PW:         1.30 cm   LV E/e' medial:  14.3 LV IVS:        1.70 cm   LV e' lateral:   11.67 cm/s LVOT diam:     2.10 cm   LV E/e' lateral: 10.9 LV SV:         37 LV SV Index:   16 LVOT Area:     3.46 cm   RIGHT VENTRICLE            IVC RVSP:           43.7 mmHg  IVC diam: 1.70 cm  LEFT ATRIUM              Index        RIGHT ATRIUM           Index LA diam:        5.60 cm  2.39 cm/m   RA Pressure: 3.00 mmHg LA Vol (A2C):   107.0 ml 45.65 ml/m  RA Area:     19.30 cm LA Vol (A4C):   104.0 ml 44.37 ml/m  RA Volume:   49.80 ml  21.24 ml/m LA Biplane Vol: 107.0 ml 45.65 ml/m AORTIC VALVE LVOT Vmax:   56.86 cm/s LVOT Vmean:  39.780 cm/s LVOT VTI:    0.107 m AI PHT:      533 msec  AORTA Ao Root diam: 3.60 cm Ao Asc diam:  3.60 cm  MITRAL VALVE                TRICUSPID VALVE MV Area (PHT): 4.30 cm     TR Peak grad:   40.7 mmHg MV Decel Time: 177 msec     TR Vmax:        319.00 cm/s MV E velocity: 126.80 cm/s  Estimated RAP:  3.00 mmHg RVSP:           43.7 mmHg  SHUNTS Systemic VTI:  0.11 m Systemic Diam: 2.10 cm  Darryle Decent MD Electronically signed by Darryle Decent MD Signature Date/Time: 03/04/2024/3:56:51 PM    Final         PYP SCAN  MYOCARDIAL AMYLOID PLANAR AND SPECT 04/29/2022  Interpretation Summary   By semi-quantitative assessment scan is consistent with increased heart uptake but less than rib uptake-Grade 1. Heart to contralateral lung ratio is between 1-1.5, indeterminate for amyloid.   Study is equivocal for TTR amyloidosis (visual score of 1/ratio  between 1-1.5).  ______________________________________________________________________________________________      Risk Assessment/Calculations  CHA2DS2-VASc Score = 6   This indicates a  9.7% annual risk of stroke. The patient's score is based upon: CHF History: 1 HTN History: 1 Diabetes History: 1 Stroke History: 0 Vascular Disease History: 1 Age Score: 2 Gender Score: 0            Physical Exam VS:  BP 98/60 (BP Location: Right Arm, Patient Position: Sitting, Cuff Size: Large)   Pulse 79   Resp 16   Ht 5' 11 (1.803 m)   Wt 266 lb 9.6 oz (120.9 kg)   SpO2 93%   BMI 37.18 kg/m        Wt Readings from Last 3 Encounters:  05/07/24 266 lb 9.6 oz (120.9 kg)  04/19/24 260 lb (117.9 kg)  03/17/24 264 lb (119.7 kg)    GEN: Well nourished, well developed in no acute distress NECK: No JVD; No carotid bruits CARDIAC: Irregularly irregular, no murmurs, rubs, gallops RESPIRATORY:  Clear to auscultation without rales, wheezing or rhonchi  ABDOMEN: Soft, non-tender, non-distended EXTREMITIES:  No edema; No deformity   ASSESSMENT AND PLAN CAD - Stable with no anginal symptoms. No indication for ischemic evaluation.  Most recent cath in 02/2019 showed patent mid LCX overlapping stents with 10-20% late lumen loss, patent proximal Diag 2 stent without ISR. There were 100% occlusion of the mid RCA with left-to-right collaterals. Also 40% mid LAD, ostial diag 2 80%. Seen in the ED on 10/27 with chest pain. hsTn 16>25. 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 Most recent echo from 03/04/24 showed EF 55-60% with regional wall motion abnormalities, low normal RV systolic function, mild MR, Continue carvedilol  37.5 mg BID, hydralazine  100 mg BID, spironolactone  25 mg daily, valsartan  80 mg daily. Creatine stable at 1.85, K 3.2 in the ED on 10/27. Ordered repeat BMP today     HTN  BP low normal, asymptomatic. Continue current meds for now.   Permanent atrial fibrillation  On coumadin - denies any adverse bleeding effects, requests we check PT/INR today as he will not have transportation next week.        Dispo: Labs per above, follow up in 6 months.   Signed, Gregory JAYSON Hoover, NP

## 2024-05-07 ENCOUNTER — Ambulatory Visit: Attending: Cardiology | Admitting: Cardiology

## 2024-05-07 ENCOUNTER — Encounter: Payer: Self-pay | Admitting: Cardiology

## 2024-05-07 VITALS — BP 98/60 | HR 79 | Resp 16 | Ht 71.0 in | Wt 266.6 lb

## 2024-05-07 DIAGNOSIS — Z5181 Encounter for therapeutic drug level monitoring: Secondary | ICD-10-CM

## 2024-05-07 DIAGNOSIS — I10 Essential (primary) hypertension: Secondary | ICD-10-CM

## 2024-05-07 DIAGNOSIS — Z79899 Other long term (current) drug therapy: Secondary | ICD-10-CM | POA: Diagnosis not present

## 2024-05-07 DIAGNOSIS — I5020 Unspecified systolic (congestive) heart failure: Secondary | ICD-10-CM

## 2024-05-07 DIAGNOSIS — I25118 Atherosclerotic heart disease of native coronary artery with other forms of angina pectoris: Secondary | ICD-10-CM

## 2024-05-07 NOTE — Patient Instructions (Signed)
 Medication Instructions:  Your physician recommends that you continue on your current medications as directed. Please refer to the Current Medication list given to you today.  *If you need a refill on your cardiac medications before your next appointment, please call your pharmacy*  Lab Work: Today: CMET, CBC, Magnesium level, INR  If you have any lab test that is abnormal or we need to change your treatment, we will call you to review the results.  Testing/Procedures: None ordered  Follow-Up: At [facility], you and your health needs are our priority.  As part of our continuing mission to provide you with exceptional heart care, our providers are all part of one team.  This team includes your primary Cardiologist (physician) and Advanced Practice Providers or APPs (Physician Assistants and Nurse Practitioners) who all work together to provide you with the care you need, when you need it.  Your next appointment:   6 month(s)  Provider:   Gordy Bergamo, MD     Thank you for choosing [facility]!!   [phone]

## 2024-05-08 LAB — COMPREHENSIVE METABOLIC PANEL WITH GFR
ALT: 9 IU/L (ref 0–44)
AST: 12 IU/L (ref 0–40)
Albumin: 3.6 g/dL — ABNORMAL LOW (ref 3.8–4.8)
Alkaline Phosphatase: 117 IU/L (ref 47–123)
BUN/Creatinine Ratio: 11 (ref 10–24)
BUN: 16 mg/dL (ref 8–27)
Bilirubin Total: 0.5 mg/dL (ref 0.0–1.2)
CO2: 28 mmol/L (ref 20–29)
Calcium: 8.5 mg/dL — ABNORMAL LOW (ref 8.6–10.2)
Chloride: 99 mmol/L (ref 96–106)
Creatinine, Ser: 1.47 mg/dL — ABNORMAL HIGH (ref 0.76–1.27)
Globulin, Total: 2.6 g/dL (ref 1.5–4.5)
Glucose: 89 mg/dL (ref 70–99)
Potassium: 4.2 mmol/L (ref 3.5–5.2)
Sodium: 140 mmol/L (ref 134–144)
Total Protein: 6.2 g/dL (ref 6.0–8.5)
eGFR: 48 mL/min/1.73 — ABNORMAL LOW

## 2024-05-08 LAB — CBC
Hematocrit: 42.5 % (ref 37.5–51.0)
Hemoglobin: 14.2 g/dL (ref 13.0–17.7)
MCH: 31.1 pg (ref 26.6–33.0)
MCHC: 33.4 g/dL (ref 31.5–35.7)
MCV: 93 fL (ref 79–97)
Platelets: 195 x10E3/uL (ref 150–450)
RBC: 4.56 x10E6/uL (ref 4.14–5.80)
RDW: 12.7 % (ref 11.6–15.4)
WBC: 6.9 x10E3/uL (ref 3.4–10.8)

## 2024-05-08 LAB — MAGNESIUM: Magnesium: 1.7 mg/dL (ref 1.6–2.3)

## 2024-05-10 ENCOUNTER — Ambulatory Visit: Payer: Self-pay | Admitting: Cardiology

## 2024-05-11 ENCOUNTER — Ambulatory Visit

## 2024-05-11 ENCOUNTER — Other Ambulatory Visit: Payer: Self-pay

## 2024-05-11 DIAGNOSIS — Z5181 Encounter for therapeutic drug level monitoring: Secondary | ICD-10-CM

## 2024-05-11 DIAGNOSIS — Z79899 Other long term (current) drug therapy: Secondary | ICD-10-CM

## 2024-05-11 DIAGNOSIS — I25118 Atherosclerotic heart disease of native coronary artery with other forms of angina pectoris: Secondary | ICD-10-CM

## 2024-05-11 NOTE — Telephone Encounter (Signed)
 Spoke with pt regarding lab results. Pt verbalized understanding and agreed to plan. Pt stated he received a call regarding his INR not being drawn and he plan to come in next week to get his INR checked. Pt was advise to call our office if he has any concerns.

## 2024-05-19 ENCOUNTER — Ambulatory Visit

## 2024-05-25 ENCOUNTER — Other Ambulatory Visit: Payer: Self-pay | Admitting: Cardiology

## 2024-05-25 DIAGNOSIS — I4821 Permanent atrial fibrillation: Secondary | ICD-10-CM

## 2024-05-25 DIAGNOSIS — Z7901 Long term (current) use of anticoagulants: Secondary | ICD-10-CM

## 2024-05-25 NOTE — Telephone Encounter (Signed)
 Refill request for warfarin:  Last INR was 3.6 on 04/20/24 Next INR due 05/31/24 LOV was 05/07/24  Refill approved.

## 2024-05-26 ENCOUNTER — Other Ambulatory Visit: Payer: Self-pay | Admitting: Cardiology

## 2024-05-31 ENCOUNTER — Ambulatory Visit

## 2024-06-03 ENCOUNTER — Ambulatory Visit: Attending: Internal Medicine | Admitting: *Deleted

## 2024-06-03 DIAGNOSIS — Z5181 Encounter for therapeutic drug level monitoring: Secondary | ICD-10-CM

## 2024-06-03 DIAGNOSIS — Z7901 Long term (current) use of anticoagulants: Secondary | ICD-10-CM

## 2024-06-03 DIAGNOSIS — I4821 Permanent atrial fibrillation: Secondary | ICD-10-CM

## 2024-06-03 LAB — POCT INR: POC INR: 3.6

## 2024-06-03 MED ORDER — WARFARIN SODIUM 2.5 MG PO TABS: ORAL_TABLET | ORAL | 1 refills | Status: AC

## 2024-06-03 NOTE — Progress Notes (Signed)
 Lab Results  Component Value Date   INR 3.6 06/03/2024   INR 3.6 (H) 04/19/2024   INR 3.1 (A) 03/09/2024   Description   INR 3.6; Hold warfarin today and START taking warfarin  2.5 mg daily. Recheck INR in 3 weeks.  Coumadin  Clinic [phone]

## 2024-06-03 NOTE — Patient Instructions (Addendum)
 Description   INR 3.6; Hold warfarin today and START taking warfarin  2.5 mg daily. Recheck INR in 3 weeks.  Coumadin  Clinic [phone]

## 2024-06-25 ENCOUNTER — Ambulatory Visit

## 2024-07-07 ENCOUNTER — Ambulatory Visit: Attending: Cardiology | Admitting: Pharmacist

## 2024-07-07 DIAGNOSIS — Z7901 Long term (current) use of anticoagulants: Secondary | ICD-10-CM | POA: Diagnosis not present

## 2024-07-07 DIAGNOSIS — I4891 Unspecified atrial fibrillation: Secondary | ICD-10-CM | POA: Insufficient documentation

## 2024-07-07 DIAGNOSIS — I4811 Longstanding persistent atrial fibrillation: Secondary | ICD-10-CM

## 2024-07-07 DIAGNOSIS — I4819 Other persistent atrial fibrillation: Secondary | ICD-10-CM

## 2024-07-07 LAB — POCT INR: INR: 2.4 (ref 2.0–3.0)

## 2024-07-07 NOTE — Progress Notes (Signed)
 Description   INR 2.4; Continue taking warfarin  2.5 mg daily. Recheck INR in 4 weeks.  Coumadin  Clinic [phone]

## 2024-07-07 NOTE — Patient Instructions (Signed)
 Description   INR 2.4; Continue taking warfarin  2.5 mg daily. Recheck INR in 4 weeks.  Coumadin  Clinic [phone]

## 2024-08-09 ENCOUNTER — Ambulatory Visit
